# Patient Record
Sex: Male | Born: 1985 | Race: Black or African American | Hispanic: No | Marital: Married | Smoking: Former smoker
Health system: Southern US, Community
[De-identification: ages and names within clinical notes are randomized; demographics above are authoritative.]

## PROBLEM LIST (undated history)

## (undated) DIAGNOSIS — J869 Pyothorax without fistula: Secondary | ICD-10-CM

## (undated) DIAGNOSIS — I10 Essential (primary) hypertension: Secondary | ICD-10-CM

## (undated) HISTORY — PX: FRACTURE SURGERY: SHX138

---

## 2005-06-08 ENCOUNTER — Emergency Department (HOSPITAL_COMMUNITY): Admission: EM | Admit: 2005-06-08 | Discharge: 2005-06-08 | Payer: Self-pay | Admitting: Emergency Medicine

## 2006-07-24 ENCOUNTER — Emergency Department (HOSPITAL_COMMUNITY): Admission: EM | Admit: 2006-07-24 | Discharge: 2006-07-25 | Payer: Self-pay | Admitting: Emergency Medicine

## 2006-07-25 ENCOUNTER — Emergency Department (HOSPITAL_COMMUNITY): Admission: EM | Admit: 2006-07-25 | Discharge: 2006-07-25 | Payer: Self-pay | Admitting: Emergency Medicine

## 2006-07-25 ENCOUNTER — Ambulatory Visit (HOSPITAL_COMMUNITY): Admission: RE | Admit: 2006-07-25 | Discharge: 2006-07-25 | Payer: Self-pay | Admitting: Family Medicine

## 2006-12-24 ENCOUNTER — Emergency Department (HOSPITAL_COMMUNITY): Admission: EM | Admit: 2006-12-24 | Discharge: 2006-12-25 | Payer: Self-pay | Admitting: Emergency Medicine

## 2006-12-26 ENCOUNTER — Emergency Department (HOSPITAL_COMMUNITY): Admission: EM | Admit: 2006-12-26 | Discharge: 2006-12-27 | Payer: Self-pay | Admitting: Emergency Medicine

## 2007-05-03 ENCOUNTER — Emergency Department (HOSPITAL_COMMUNITY): Admission: EM | Admit: 2007-05-03 | Discharge: 2007-05-03 | Payer: Self-pay | Admitting: Family Medicine

## 2008-07-02 ENCOUNTER — Emergency Department (HOSPITAL_COMMUNITY): Admission: EM | Admit: 2008-07-02 | Discharge: 2008-07-02 | Payer: Self-pay | Admitting: Emergency Medicine

## 2008-09-11 ENCOUNTER — Emergency Department (HOSPITAL_COMMUNITY): Admission: EM | Admit: 2008-09-11 | Discharge: 2008-09-11 | Payer: Self-pay | Admitting: Emergency Medicine

## 2009-01-27 ENCOUNTER — Emergency Department (HOSPITAL_COMMUNITY): Admission: EM | Admit: 2009-01-27 | Discharge: 2009-01-27 | Payer: Self-pay | Admitting: Emergency Medicine

## 2009-04-07 ENCOUNTER — Emergency Department (HOSPITAL_COMMUNITY): Admission: EM | Admit: 2009-04-07 | Discharge: 2009-04-07 | Payer: Self-pay | Admitting: Family Medicine

## 2009-05-09 ENCOUNTER — Emergency Department (HOSPITAL_COMMUNITY): Admission: EM | Admit: 2009-05-09 | Discharge: 2009-05-09 | Payer: Self-pay | Admitting: Emergency Medicine

## 2010-05-23 LAB — POCT I-STAT, CHEM 8
BUN: 7 mg/dL (ref 6–23)
Calcium, Ion: 1.15 mmol/L (ref 1.12–1.32)
Chloride: 104 mEq/L (ref 96–112)
Hemoglobin: 16.3 g/dL (ref 13.0–17.0)
Sodium: 141 mEq/L (ref 135–145)
TCO2: 30 mmol/L (ref 0–100)

## 2010-05-23 LAB — POCT RAPID STREP A (OFFICE): Streptococcus, Group A Screen (Direct): NEGATIVE

## 2010-11-22 LAB — POCT RAPID STREP A: Streptococcus, Group A Screen (Direct): NEGATIVE

## 2010-11-22 LAB — POCT INFECTIOUS MONO SCREEN: Mono Screen: POSITIVE — AB

## 2010-12-08 LAB — I-STAT 8, (EC8 V) (CONVERTED LAB)
BUN: 10
Chloride: 104
Glucose, Bld: 93
Operator id: 294511
Potassium: 3.5
Sodium: 140

## 2010-12-08 LAB — POCT I-STAT CREATININE: Creatinine, Ser: 1.2

## 2010-12-08 LAB — CBC
HCT: 41.9
Hemoglobin: 14.3
Platelets: 338
RBC: 4.62
RDW: 12.1
WBC: 13.1 — ABNORMAL HIGH

## 2011-12-02 ENCOUNTER — Emergency Department (HOSPITAL_COMMUNITY): Payer: Self-pay

## 2011-12-02 ENCOUNTER — Emergency Department (HOSPITAL_COMMUNITY)
Admission: EM | Admit: 2011-12-02 | Discharge: 2011-12-02 | Disposition: A | Discharge status: Home / Self Care | Payer: Self-pay | Attending: Emergency Medicine | Admitting: Emergency Medicine

## 2011-12-02 ENCOUNTER — Encounter (HOSPITAL_COMMUNITY): Payer: Self-pay

## 2011-12-02 DIAGNOSIS — I10 Essential (primary) hypertension: Secondary | ICD-10-CM | POA: Insufficient documentation

## 2011-12-02 DIAGNOSIS — R0602 Shortness of breath: Secondary | ICD-10-CM | POA: Insufficient documentation

## 2011-12-02 DIAGNOSIS — R42 Dizziness and giddiness: Secondary | ICD-10-CM | POA: Insufficient documentation

## 2011-12-02 HISTORY — DX: Essential (primary) hypertension: I10

## 2011-12-02 LAB — CBC WITH DIFFERENTIAL/PLATELET
Basophils Absolute: 0.1 10*3/uL (ref 0.0–0.1)
Eosinophils Relative: 1 % (ref 0–5)
HCT: 43.7 % (ref 39.0–52.0)
Hemoglobin: 15.4 g/dL (ref 13.0–17.0)
Lymphocytes Relative: 21 % (ref 12–46)
MCHC: 35.2 g/dL (ref 30.0–36.0)
MCV: 86.9 fL (ref 78.0–100.0)
Neutro Abs: 9.8 10*3/uL — ABNORMAL HIGH (ref 1.7–7.7)
Neutrophils Relative %: 72 % (ref 43–77)
Platelets: 415 10*3/uL — ABNORMAL HIGH (ref 150–400)

## 2011-12-02 LAB — COMPREHENSIVE METABOLIC PANEL
AST: 17 U/L (ref 0–37)
Albumin: 4.5 g/dL (ref 3.5–5.2)
CO2: 28 mEq/L (ref 19–32)
Calcium: 9.6 mg/dL (ref 8.4–10.5)
Chloride: 100 mEq/L (ref 96–112)
Creatinine, Ser: 1 mg/dL (ref 0.50–1.35)
GFR calc Af Amer: >90 mL/min (ref >90)
GFR calc non Af Amer: >90 mL/min (ref >90)
Potassium: 3.8 mEq/L (ref 3.5–5.1)
Sodium: 137 mEq/L (ref 135–145)
Total Bilirubin: 0.6 mg/dL (ref 0.3–1.2)

## 2011-12-02 LAB — URINALYSIS, ROUTINE W REFLEX MICROSCOPIC
Glucose, UA: NEGATIVE mg/dL
Hgb urine dipstick: NEGATIVE
Ketones, ur: NEGATIVE mg/dL
Leukocytes, UA: NEGATIVE
Nitrite: NEGATIVE
Specific Gravity, Urine: 1.02 (ref 1.005–1.030)

## 2011-12-02 MED ORDER — BENAZEPRIL HCL 10 MG PO TABS
10.0000 mg | ORAL_TABLET | Freq: Every day | ORAL | Status: DC
  Administered 2011-12-02: 10 mg via ORAL
  Filled 2011-12-02: qty 1

## 2011-12-02 MED ORDER — AMLODIPINE BESY-BENAZEPRIL HCL 5-10 MG PO CAPS: 1.0000 | ORAL_CAPSULE | Freq: Every day | ORAL | Status: DC

## 2011-12-02 MED ORDER — AMLODIPINE BESYLATE 5 MG PO TABS
5.0000 mg | ORAL_TABLET | Freq: Once | ORAL | Status: AC
  Administered 2011-12-02: 5 mg via ORAL
  Filled 2011-12-02: qty 1

## 2011-12-02 NOTE — ED Provider Notes (Addendum)
History     CSN: [id number]  Arrival date & time 12/02/11  1552   First MD Initiated Contact with Patient 12/02/11 1849      Chief Complaint  Patient presents with  . Hypertension  . Headache  . Dizziness    (Consider location/radiation/quality/duration/timing/severity/associated sxs/prior treatment) The history is provided by the patient.  Todd Pena is a 26 y.o. male here with dizziness and headache and SOB. He has been having intermittent dizziness and lightheadedness for the last week. Felt like he was going to pass out but didn't pass out. Also occasional frontal headaches. No vomiting but felt nauseous. He also has intermittent shortness of breath but no chest pain. No recent travel. He is here because his BP today was 190/120. No fever or neck pain or urinary symptoms.    Past Medical History  Diagnosis Date  . Hypertension     History reviewed. No pertinent past surgical history.  No family history on file.  History  Substance Use Topics  . Smoking status: Never Smoker   . Smokeless tobacco: Not on file  . Alcohol Use: No      Review of Systems  Respiratory: Positive for shortness of breath.   Neurological: Positive for dizziness, weakness and headaches.  All other systems reviewed and are negative.    Allergies  Review of patient's allergies indicates no known allergies.  Home Medications   Current Outpatient Rx  Name Route Sig Dispense Refill  . NAPROXEN SODIUM 220 MG PO TABS Oral Take 220 mg by mouth 2 (two) times daily with a meal.      BP 163/117  Pulse 78  Temp 98.9 F (37.2 C) (Oral)  Resp 26  Wt 260 lb (117.935 kg)  SpO2 98%  Physical Exam  Nursing note and vitals reviewed. Constitutional: He is oriented to person, place, and time. He appears well-developed and well-nourished.       anxious  HENT:  Head: Normocephalic.  Mouth/Throat: Oropharynx is clear and moist.  Eyes: Conjunctivae normal are normal. Pupils are equal,  round, and reactive to light.  Neck: Normal range of motion. Neck supple.  Cardiovascular: Normal rate, regular rhythm and normal heart sounds.   Pulmonary/Chest: Effort normal and breath sounds normal. No respiratory distress. He has no wheezes. He has no rales.  Abdominal: Soft. Bowel sounds are normal. He exhibits no distension. There is no tenderness. There is no rebound.  Musculoskeletal: Normal range of motion. He exhibits no edema and no tenderness.  Neurological: He is alert and oriented to person, place, and time. No cranial nerve deficit.       Good strength and sensation throughout.   Skin: Skin is warm and dry.  Psychiatric: He has a normal mood and affect. His behavior is normal. Judgment and thought content normal.    ED Course  Procedures (including critical care time)  Labs Reviewed  CBC WITH DIFFERENTIAL - Abnormal; Notable for the following:    WBC 13.7 (*)     Platelets 415 (*)     Neutro Abs 9.8 (*)     All other components within normal limits  GLUCOSE, CAPILLARY  COMPREHENSIVE METABOLIC PANEL  TROPONIN I  URINALYSIS, ROUTINE W REFLEX MICROSCOPIC   Dg Chest 2 View  12/02/2011  *RADIOLOGY REPORT*  Clinical Data: Shortness of breath.  CHEST - 2 VIEW  Comparison: None.  Findings: Low lung volumes are present, causing crowding of the pulmonary vasculature.  Cardiac and mediastinal contours appear unremarkable.  No  pleural effusion observed.  The lungs appear clear.  IMPRESSION:  1. Low lung volumes are present, causing crowding of the pulmonary vasculature.    Otherwise, no significant abnormality identified.   Original Report Authenticated By: Dellia Cloud, M.D.    Ct Head Wo Contrast  12/02/2011  *RADIOLOGY REPORT*  Clinical Data: High blood pressure.  Dizzy.  Headache.  CT HEAD WITHOUT CONTRAST  Technique:  Contiguous axial images were obtained from the base of the skull through the vertex without contrast.  Comparison: None.  Findings: There is no intra or  extra-axial fluid collection or mass lesion.  The basilar cisterns and ventricles have a normal appearance.  There is no CT evidence for acute infarction or hemorrhage.  Bone windows show no acute findings.  IMPRESSION: Negative exam.   Original Report Authenticated By: Patterson Hammersmith, M.D.      1. Hypertension   2. Dizziness   3. Shortness of breath      Date: 12/02/2011  Rate: 71  Rhythm: normal sinus rhythm  QRS Axis: normal  Intervals: normal  ST/T Wave abnormalities: TWI inferior laterally   Conduction Disutrbances:none  Narrative Interpretation: inferior Q waves  Old EKG Reviewed: none available    MDM  Todd Pena is a 26 y.o. male here with dizziness, HA, SOB and hypertension. I think his symptoms are likely from hypertension. His neuro exam is nl. Will get labs, trop x 1 (low risk for ACS but SOB for more than a day), and CT head (r/o hypertensive bleed). Will start PO antihypertensives and re evaluate.   9:45 PM Patient feels well. Not dizzy or short of breath. Low risk for ACS and symptoms persists for more than a day so trop x 1 sufficient. Labs showed WBC 13, with nl CXR and nl UA. CMP nl and CT head nl. Will d/c home with norvasc and benzapril. BP slightly improved now 150s/ 110.        Richardean Canal, MD 12/02/11 2147  Richardean Canal, MD 12/02/11 [phone]

## 2011-12-02 NOTE — ED Notes (Signed)
Pt came to ED today because he had a High BP check at home of 190/125. BP in room is 176/113. Pt reports feeling dizzy, headache, and SOB. Sat 99% RA. Positive nausea, no vomiting.

## 2011-12-02 NOTE — ED Notes (Signed)
Patient transported to CT 

## 2011-12-02 NOTE — ED Notes (Signed)
Patient instructed to get medication for blood pressure filled and to start taking medication.

## 2011-12-02 NOTE — ED Notes (Signed)
Pt is aware of the need for urine sample however states he is unable at this time.

## 2011-12-02 NOTE — ED Notes (Signed)
Pt denies chest pain at this time pt states intermittent shortness of breath today only.

## 2013-08-29 ENCOUNTER — Encounter (HOSPITAL_BASED_OUTPATIENT_CLINIC_OR_DEPARTMENT_OTHER): Payer: Self-pay | Admitting: Emergency Medicine

## 2013-08-29 ENCOUNTER — Emergency Department (HOSPITAL_BASED_OUTPATIENT_CLINIC_OR_DEPARTMENT_OTHER): Payer: BC Managed Care – PPO

## 2013-08-29 ENCOUNTER — Emergency Department (HOSPITAL_BASED_OUTPATIENT_CLINIC_OR_DEPARTMENT_OTHER)
Admission: EM | Admit: 2013-08-29 | Discharge: 2013-08-29 | Disposition: A | Discharge status: Home / Self Care | Payer: BC Managed Care – PPO | Attending: Emergency Medicine | Admitting: Emergency Medicine

## 2013-08-29 DIAGNOSIS — M25469 Effusion, unspecified knee: Secondary | ICD-10-CM | POA: Insufficient documentation

## 2013-08-29 DIAGNOSIS — I10 Essential (primary) hypertension: Secondary | ICD-10-CM | POA: Insufficient documentation

## 2013-08-29 DIAGNOSIS — Z791 Long term (current) use of non-steroidal anti-inflammatories (NSAID): Secondary | ICD-10-CM | POA: Insufficient documentation

## 2013-08-29 DIAGNOSIS — M25461 Effusion, right knee: Secondary | ICD-10-CM

## 2013-08-29 MED ORDER — HYDROCODONE-ACETAMINOPHEN 5-325 MG PO TABS: 2.0000 | ORAL_TABLET | ORAL | Status: DC | PRN

## 2013-08-29 MED ORDER — IBUPROFEN 800 MG PO TABS: 800.0000 mg | ORAL_TABLET | Freq: Three times a day (TID) | ORAL | Status: DC

## 2013-08-29 NOTE — ED Notes (Signed)
Pt reports (R) knee pain x 1 week.  Denies ijury

## 2013-08-29 NOTE — ED Provider Notes (Signed)
CSN: [id number]     Arrival date & time 08/29/13  1055 History  This chart was scribed for Glynn OctaveStephen Kyrie Fludd, MD by Shari HeritageAisha Amuda, ED Scribe. The patient was seen in room MH08/MH08. Patient's care was started at 11:44 AM.   Chief Complaint  Patient presents with  . Knee Pain    The history is provided by the patient. No language interpreter was used.    HPI Comments: Todd Pena is a 28 y.o. male with history of HTN who presents to the Emergency Department complaining of progressively worsening, right knee pain onset 1 week ago. He says when pain first started it was intermittent, but for the past couple of days pain has been constant. Pain is worse with extension and flexion of his knee. He says that when he woke up this morning he noticed "lumps" to his knee. He has tried wrapping the knee without relief. He denies any obvious injury or trauma. He does not exercise regularly. He denies any other pain, fever, vomiting, chest pain, shortness of breath, abdominal pain or urinary symptoms.   Past Medical History  Diagnosis Date  . Hypertension    History reviewed. No pertinent past surgical history. History reviewed. No pertinent family history. History  Substance Use Topics  . Smoking status: Never Smoker   . Smokeless tobacco: Not on file  . Alcohol Use: No    Review of Systems A complete 10 system review of systems was obtained and all systems are negative except as noted in the HPI and PMH.   Allergies  Review of patient's allergies indicates no known allergies.  Home Medications   Prior to Admission medications   Medication Sig Start Date End Date Taking? Authorizing Provider  HYDROcodone-acetaminophen (NORCO/VICODIN) 5-325 MG per tablet Take 2 tablets by mouth every 4 (four) hours as needed. 08/29/13   Glynn OctaveStephen Marian Grandt, MD  ibuprofen (ADVIL,MOTRIN) 800 MG tablet Take 1 tablet (800 mg total) by mouth 3 (three) times daily. 08/29/13   Glynn OctaveStephen Gerlean Cid, MD   Triage Vitals: BP  189/107  Pulse 80  Temp(Src) 98.7 F (37.1 C) (Oral)  Resp 16  Wt 265 lb (120.203 kg)  SpO2 97%  Physical Exam  Nursing note and vitals reviewed. Constitutional: He is oriented to person, place, and time. He appears well-developed and well-nourished. No distress.  HENT:  Head: Normocephalic and atraumatic.  Mouth/Throat: Oropharynx is clear and moist. No oropharyngeal exudate.  Eyes: Conjunctivae and EOM are normal. Pupils are equal, round, and reactive to light.  Neck: Normal range of motion. Neck supple.  No meningismus.  Cardiovascular: Normal rate, regular rhythm, normal heart sounds and intact distal pulses.   No murmur heard. Pulmonary/Chest: Effort normal and breath sounds normal. No respiratory distress.  Abdominal: Soft. There is no tenderness. There is no rebound and no guarding.  Musculoskeletal: Normal range of motion. He exhibits no edema and no tenderness.       Right knee: He exhibits effusion. He exhibits no MCL laxity.  Small effusion of right knee. No ligamental laxity. Flexion and extension of right knee intact.  Neurological: He is alert and oriented to person, place, and time. No cranial nerve deficit. He exhibits normal muscle tone. Coordination normal.  No ataxia on finger to nose bilaterally. No pronator drift. 5/5 strength throughout. CN 2-12 intact. Negative Romberg. Equal grip strength. Sensation intact. Gait is normal.   Skin: Skin is warm.  Psychiatric: He has a normal mood and affect. His behavior is normal.  ED Course  Procedures (including critical care time) DIAGNOSTIC STUDIES: Oxygen Saturation is 97% on room air, normal by my interpretation.    COORDINATION OF CARE: 11:47 AM- Patient informed of current plan for treatment and evaluation and agrees with plan at this time.   12:11 PM - Updated patient on imaging results which shows large joint effusion. Advised patient that we will provide knee immobilizer, and treat with antiinflammatory  medications and elevation. Will also provide referral to sports medicine.  Imaging Review Dg Knee Complete 4 Views Right  08/29/2013   CLINICAL DATA:  Knee pain  EXAM: RIGHT KNEE - COMPLETE 4+ VIEW  COMPARISON:  None.  FINDINGS: Large joint effusion. Negative for fracture. Joint spaces are normal without significant degenerative change.  IMPRESSION: Large joint effusion may indicate internal derangement. Negative for fracture.   Electronically Signed   By: Marlan Palauharles  Clark M.D.   On: 08/29/2013 12:06     EKG Interpretation None      MDM   Final diagnoses:  Knee effusion, right  Essential hypertension   1 week of R knee pain, denies trauma.  Worsening pain today.  No fever or vomiting.    R knee with effusion, ROM intact, flexion and extension intact. No warmth or erythema.  Xray negative.  Effusion noted.  ROM intact.  No evidence of septic joint. Knee immoblizer, RICE, follow up with sports med.  Patient aware of elevated blood pressure and states that his medications for several months and does not know what they are. Denies any chest pain, headache, shortness of breath, vision change. He understands the risks of high blood pressure And long-term effects on brain, kidney, eyes. He needs to reestablish care with PCP.  I personally performed the services described in this documentation, which was scribed in my presence. The recorded information has been reviewed and is accurate.   Glynn OctaveStephen Travas Schexnayder, MD 08/29/13 [phone]

## 2013-08-29 NOTE — Discharge Instructions (Signed)
Knee Effusion Follow up with Dr. Pearletha ForgeHudnall this week.  Keep your knee elevated. Return to the ED if you develop new or worsening symptoms. The medical term for having fluid in your knee is effusion. This is often due to an internal derangement of the knee. This means something is wrong inside the knee. Some of the causes of fluid in the knee may be torn cartilage, a torn ligament, or bleeding into the joint from an injury. Your knee is likely more difficult to bend and move. This is often because there is increased pain and pressure in the joint. The time it takes for recovery from a knee effusion depends on different factors, including:   Type of injury.  Your age.  Physical and medical conditions.  Rehabilitation Strategies. How long you will be away from your normal activities will depend on what kind of knee problem you have and how much damage is present. Your knee has two types of cartilage. Articular cartilage covers the bone ends and lets your knee bend and move smoothly. Two menisci, thick pads of cartilage that form a rim inside the joint, help absorb shock and stabilize your knee. Ligaments bind the bones together and support your knee joint. Muscles move the joint, help support your knee, and take stress off the joint itself. CAUSES  Often an effusion in the knee is caused by an injury to one of the menisci. This is often a tear in the cartilage. Recovery after a meniscus injury depends on how much meniscus is damaged and whether you have damaged other knee tissue. Small tears may heal on their own with conservative treatment. Conservative means rest, limited weight bearing activity and muscle strengthening exercises. Your recovery may take up to 6 weeks.  TREATMENT  Larger tears may require surgery. Meniscus injuries may be treated during arthroscopy. Arthroscopy is a procedure in which your surgeon uses a small telescope like instrument to look in your knee. Your caregiver can make a more  accurate diagnosis (learning what is wrong) by performing an arthroscopic procedure. If your injury is on the inner margin of the meniscus, your surgeon may trim the meniscus back to a smooth rim. In other cases your surgeon will try to repair a damaged meniscus with stitches (sutures). This may make rehabilitation take longer, but may provide better long term result by helping your knee keep its shock absorption capabilities. Ligaments which are completely torn usually require surgery for repair. HOME CARE INSTRUCTIONS  Use crutches as instructed.  If a brace is applied, use as directed.  Once you are home, an ice pack applied to your swollen knee may help with discomfort and help decrease swelling.  Keep your knee raised (elevated) when you are not up and around or on crutches.  Only take over-the-counter or prescription medicines for pain, discomfort, or fever as directed by your caregiver.  Your caregivers will help with instructions for rehabilitation of your knee. This often includes strengthening exercises.  You may resume a normal diet and activities as directed. SEEK MEDICAL CARE IF:   There is increased swelling in your knee.  You notice redness, swelling, or increasing pain in your knee.  An unexplained oral temperature above 102 F (38.9 C) develops. SEEK IMMEDIATE MEDICAL CARE IF:   You develop a rash.  You have difficulty breathing.  You have any allergic reactions from medications you may have been given.  There is severe pain with any motion of the knee. MAKE SURE YOU:  Understand these instructions.  Will watch your condition.  Will get help right away if you are not doing well or get worse. Document Released: 05/07/2003 Document Revised: 05/09/2011 Document Reviewed: 07/11/2007 [facility] Patient Information 2015 [employer], [place]. This information is not intended to replace advice given to you by your health care provider. Make sure you discuss any questions  you have with your health care provider.

## 2013-10-24 ENCOUNTER — Emergency Department (HOSPITAL_BASED_OUTPATIENT_CLINIC_OR_DEPARTMENT_OTHER): Payer: BC Managed Care – PPO

## 2013-10-24 ENCOUNTER — Encounter (HOSPITAL_BASED_OUTPATIENT_CLINIC_OR_DEPARTMENT_OTHER): Payer: Self-pay | Admitting: Emergency Medicine

## 2013-10-24 ENCOUNTER — Emergency Department (HOSPITAL_BASED_OUTPATIENT_CLINIC_OR_DEPARTMENT_OTHER)
Admission: EM | Admit: 2013-10-24 | Discharge: 2013-10-24 | Disposition: A | Discharge status: Home / Self Care | Payer: BC Managed Care – PPO | Attending: Emergency Medicine | Admitting: Emergency Medicine

## 2013-10-24 DIAGNOSIS — Z791 Long term (current) use of non-steroidal anti-inflammatories (NSAID): Secondary | ICD-10-CM | POA: Insufficient documentation

## 2013-10-24 DIAGNOSIS — R51 Headache: Secondary | ICD-10-CM | POA: Insufficient documentation

## 2013-10-24 DIAGNOSIS — H53149 Visual discomfort, unspecified: Secondary | ICD-10-CM | POA: Insufficient documentation

## 2013-10-24 DIAGNOSIS — R519 Headache, unspecified: Secondary | ICD-10-CM

## 2013-10-24 DIAGNOSIS — I10 Essential (primary) hypertension: Secondary | ICD-10-CM | POA: Insufficient documentation

## 2013-10-24 LAB — CBC
HCT: 44.1 % (ref 39.0–52.0)
Hemoglobin: 15.4 g/dL (ref 13.0–17.0)
MCH: 30.7 pg (ref 26.0–34.0)
MCHC: 34.9 g/dL (ref 30.0–36.0)
MCV: 87.8 fL (ref 78.0–100.0)
Platelets: 444 10*3/uL — ABNORMAL HIGH (ref 150–400)
RBC: 5.02 MIL/uL (ref 4.22–5.81)
RDW: 12.1 % (ref 11.5–15.5)
WBC: 11.8 10*3/uL — AB (ref 4.0–10.5)

## 2013-10-24 LAB — BASIC METABOLIC PANEL
Anion gap: 12 (ref 5–15)
BUN: 14 mg/dL (ref 6–23)
CHLORIDE: 100 meq/L (ref 96–112)
CO2: 28 mEq/L (ref 19–32)
Calcium: 9.9 mg/dL (ref 8.4–10.5)
Creatinine, Ser: 1.1 mg/dL (ref 0.50–1.35)
Glucose, Bld: 116 mg/dL — ABNORMAL HIGH (ref 70–99)
POTASSIUM: 3.7 meq/L (ref 3.7–5.3)
Sodium: 140 mEq/L (ref 137–147)

## 2013-10-24 LAB — TROPONIN I

## 2013-10-24 MED ORDER — DIPHENHYDRAMINE HCL 50 MG/ML IJ SOLN
25.0000 mg | Freq: Once | INTRAMUSCULAR | Status: AC
  Administered 2013-10-24: 25 mg via INTRAVENOUS
  Filled 2013-10-24: qty 1

## 2013-10-24 MED ORDER — HYDRALAZINE HCL 20 MG/ML IJ SOLN
10.0000 mg | Freq: Once | INTRAMUSCULAR | Status: AC
  Administered 2013-10-24: 10 mg via INTRAVENOUS
  Filled 2013-10-24: qty 1

## 2013-10-24 MED ORDER — METOCLOPRAMIDE HCL 5 MG/ML IJ SOLN
5.0000 mg | Freq: Once | INTRAMUSCULAR | Status: AC
  Administered 2013-10-24: 5 mg via INTRAVENOUS
  Filled 2013-10-24: qty 2

## 2013-10-24 MED ORDER — SODIUM CHLORIDE 0.9 % IV BOLUS (SEPSIS)
1000.0000 mL | INTRAVENOUS | Status: AC
Start: 2013-10-24 — End: 2013-10-24
  Administered 2013-10-24: 1000 mL via INTRAVENOUS

## 2013-10-24 MED ORDER — AMLODIPINE BESY-BENAZEPRIL HCL 5-10 MG PO CAPS: 1.0000 | ORAL_CAPSULE | Freq: Every day | ORAL | Status: DC

## 2013-10-24 NOTE — ED Provider Notes (Signed)
CSN: [id number]     Arrival date & time 10/24/13  1439 History   First MD Initiated Contact with Patient 10/24/13 1451     Chief Complaint  Patient presents with  . Hypertension     (Consider location/radiation/quality/duration/timing/severity/associated sxs/prior Treatment) Patient is a 28 y.o. male presenting with hypertension and headaches. The history is provided by the patient.  Hypertension This is a chronic problem. Episode onset: several years ago. The problem occurs constantly. The problem has not changed since onset.Associated symptoms include headaches. Pertinent negatives include no chest pain, no abdominal pain and no shortness of breath. Nothing aggravates the symptoms. Nothing relieves the symptoms. Treatments tried: previously on meds. The treatment provided moderate relief.  Headache Pain location:  L temporal Quality:  Sharp Severity currently:  4/10 Severity at highest:  6/10 Onset quality:  Gradual Duration:  4 days Timing:  Intermittent Progression:  Improving Chronicity:  New Similar to prior headaches: yes   Context comment:  Spontaneously Relieved by:  NSAIDs Worsened by:  Light Ineffective treatments:  None tried Associated symptoms: photophobia   Associated symptoms: no abdominal pain, no cough, no diarrhea, no pain, no fever, no nausea, no neck pain, no numbness and no vomiting     Past Medical History  Diagnosis Date  . Hypertension    History reviewed. No pertinent past surgical history. No family history on file. History  Substance Use Topics  . Smoking status: Never Smoker   . Smokeless tobacco: Not on file  . Alcohol Use: No    Review of Systems  Constitutional: Negative for fever.  HENT: Negative for drooling and rhinorrhea.   Eyes: Positive for photophobia. Negative for pain.  Respiratory: Negative for cough and shortness of breath.   Cardiovascular: Negative for chest pain and leg swelling.  Gastrointestinal: Negative for nausea,  vomiting, abdominal pain and diarrhea.  Genitourinary: Negative for dysuria and hematuria.  Musculoskeletal: Negative for gait problem and neck pain.  Skin: Negative for color change.  Neurological: Positive for headaches. Negative for numbness.  Hematological: Negative for adenopathy.  Psychiatric/Behavioral: Negative for behavioral problems.  All other systems reviewed and are negative.     Allergies  Review of patient's allergies indicates no known allergies.  Home Medications   Prior to Admission medications   Medication Sig Start Date End Date Taking? Authorizing Provider  HYDROcodone-acetaminophen (NORCO/VICODIN) 5-325 MG per tablet Take 2 tablets by mouth every 4 (four) hours as needed. 08/29/13   Glynn Octave, MD  ibuprofen (ADVIL,MOTRIN) 800 MG tablet Take 1 tablet (800 mg total) by mouth 3 (three) times daily. 08/29/13   Glynn Octave, MD   BP 189/118  Pulse 77  Temp(Src) 97.9 F (36.6 C) (Oral)  Resp 20  Ht  (1.803 m)  Wt 265 lb (120.203 kg)  BMI 36.98 kg/m2  SpO2 97% Physical Exam  Nursing note and vitals reviewed. Constitutional: He is oriented to person, place, and time. He appears well-developed and well-nourished.  HENT:  Head: Normocephalic and atraumatic.  Right Ear: External ear normal.  Left Ear: External ear normal.  Nose: Nose normal.  Mouth/Throat: Oropharynx is clear and moist. No oropharyngeal exudate.  Eyes: Conjunctivae and EOM are normal. Pupils are equal, round, and reactive to light.  Neck: Normal range of motion. Neck supple.  Cardiovascular: Normal rate, regular rhythm, normal heart sounds and intact distal pulses.  Exam reveals no gallop and no friction rub.   No murmur heard. Pulmonary/Chest: Effort normal and breath sounds normal. No respiratory distress.  He has no wheezes.  Abdominal: Soft. Bowel sounds are normal. He exhibits no distension. There is no tenderness. There is no rebound and no guarding.  Musculoskeletal: Normal  range of motion. He exhibits no edema and no tenderness.  Neurological: He is alert and oriented to person, place, and time.  alert, oriented x3 speech: normal in context and clarity memory: intact grossly cranial nerves II-XII: intact motor strength: full proximally and distally no involuntary movements or tremors sensation: intact to light touch diffusely  cerebellar: finger-to-nose and heel-to-shin intact gait: normal forwards and backwards   Skin: Skin is warm and dry.  Psychiatric: He has a normal mood and affect. His behavior is normal.    ED Course  Procedures (including critical care time) Labs Review Labs Reviewed  CBC - Abnormal; Notable for the following:    WBC 11.8 (*)    Platelets 444 (*)    All other components within normal limits  BASIC METABOLIC PANEL - Abnormal; Notable for the following:    Glucose, Bld 116 (*)    All other components within normal limits  TROPONIN I    Imaging Review Ct Head Wo Contrast  10/24/2013   CLINICAL DATA:  Hypertension with headache  EXAM: CT HEAD WITHOUT CONTRAST  TECHNIQUE: Contiguous axial images were obtained from the base of the skull through the vertex without intravenous contrast.  COMPARISON:  None.  FINDINGS: The ventricles are normal in size and configuration. There is no mass, hemorrhage, extra-axial fluid collection, or midline shift. Gray-white compartments appear normal. No acute infarct apparent. Bony calvarium appears intact. Visualized mastoid air cells are clear. There is a small retention cyst in each inferior maxillary antrum.  IMPRESSION: Small retention cyst in each maxillary antrum. No intracranial mass, hemorrhage, or focal gray -white compartment lesion.   Electronically Signed   By: Bretta Bang M.D.   On: 10/24/2013 16:06     EKG Interpretation   Date/Time:  Thursday October 24 2013 15:29:01 EDT Ventricular Rate:  84 PR Interval:  134 QRS Duration: 88 QT Interval:  360 QTC Calculation: 425 R  Axis:   35 Text Interpretation:  Normal sinus rhythm Nonspecific T wave abnormality  No significant change since last tracing Confirmed by Harlei Lehrmann  MD,  Trinette Vera ([id number]) on 10/24/2013 3:40:05 PM      MDM   Final diagnoses:  Headache, unspecified headache type  Essential hypertension    2:58 PM 28 y.o. male w hx of HTN who pw gradual onset intermittent HA x 4 days. C/w previous HA's. Was seen here in 2013 and started on BP meds in the ED. He has not been on BP meds since that time. BP is 189/118, otherwise VS unremarkable. Normal neuro exam. No cp/sob. Will get screening imaging, labs, hydralazine for BP, and HA cocktail.   5:13 PM: I interpreted/reviewed the labs and/or imaging which were non-contributory.  BP dec appropriately. HA now gone. Will restart lotrel which he was on before.  I have discussed the diagnosis/risks/treatment options with the patient and believe the pt to be eligible for discharge home to follow-up with and estab w/ a pcp. We also discussed returning to the ED immediately if new or worsening sx occur. We discussed the sx which are most concerning (e.g., return of HA, fever, sob, cp) that necessitate immediate return. Medications administered to the patient during their visit and any new prescriptions provided to the patient are listed below.  Medications given during this visit Medications  sodium chloride 0.9 %  bolus 1,000 mL (0 mLs Intravenous Stopped 10/24/13 1621)  hydrALAZINE (APRESOLINE) injection 10 mg (10 mg Intravenous Given 10/24/13 1544)  metoCLOPramide (REGLAN) injection 5 mg (5 mg Intravenous Given 10/24/13 1543)  diphenhydrAMINE (BENADRYL) injection 25 mg (25 mg Intravenous Given 10/24/13 1544)    New Prescriptions   AMLODIPINE-BENAZEPRIL (LOTREL) 5-10 MG PER CAPSULE    Take 1 capsule by mouth daily.     Purvis Sheffield, MD 10/24/13 1714

## 2013-10-24 NOTE — Discharge Instructions (Signed)
°Emergency Department Resource Guide °1) Find a Doctor and Pay Out of Pocket °Although you won't have to find out who is covered by your insurance plan, it is a good idea to ask around and get recommendations. You will then need to call the office and see if the doctor you have chosen will accept you as a new patient and what types of options they offer for patients who are self-pay. Some doctors offer discounts or will set up payment plans for their patients who do not have insurance, but you will need to ask so you aren't surprised when you get to your appointment. ° °2) Contact Your Local Health Department °Not all health departments have doctors that can see patients for sick visits, but many do, so it is worth a call to see if yours does. If you don't know where your local health department is, you can check in your phone book. The CDC also has a tool to help you locate your state's health department, and many state websites also have listings of all of their local health departments. ° °3) Find a Walk-in Clinic °If your illness is not likely to be very severe or complicated, you may want to try a walk in clinic. These are popping up all over the country in pharmacies, drugstores, and shopping centers. They're usually staffed by nurse practitioners or physician assistants that have been trained to treat common illnesses and complaints. They're usually fairly quick and inexpensive. However, if you have serious medical issues or chronic medical problems, these are probably not your best option. ° °No Primary Care Doctor: °- Call Health Connect at  832-8000 - they can help you locate a primary care doctor that  accepts your insurance, provides certain services, etc. °- Physician Referral Service- 1-800-533-3463 ° °Chronic Pain Problems: °Organization         Address  Phone   Notes  °[facility]  (336) [phone] Patients need to be referred by their primary care doctor.  ° °Medication  Assistance: °Organization         Address  Phone   Notes  °[place] [facility] [address] °[place], [place] [address] (336) 641-8030 --Must be a resident of [place] °-- Must have NO insurance coverage whatsoever (no Medicaid/ Medicare, etc.) °-- The pt. MUST have a primary care doctor that directs their care regularly and follows them in the community °  °[facility]  (866) 331-1348   °[employer] Way  (888) 892-1162   ° °Agencies that provide inexpensive medical care: °Organization         Address  Phone   Notes  °[facility]  (336) 832-8035   °[facility]    (336) 832-7272   °[facility] [address] °[place], [place] [address] (336) 832-4777   °[facility] [address], °[place] (336) 271-4999   °[facility]    (336) 373-0678   °[facility]    (336) 272-1050   °[facility] ° [address], [place] Phone:  (336) 832-4444, Fax:  (336) 832-4440 Hours of Operation:  9 am - 6 pm, M-F.  Also accepts Medicaid/Medicare and self-pay.  °[facility] ° [address],  Phone: (336) [phone], Fax: (336) [phone]. Hours of Operation:  8:30 am - 5:30 pm, M-F.  Also accepts Medicaid and self-pay.  °[facility] [place] [address], [place] Phone: (336) 878-6027   °[facility] [address], [place], [place] (336)723-1848, Ext. 123 Mondays & Thursdays: 7-9 AM.  First 15 patients are seen on a first come, first serve basis. °  ° °Medicaid-accepting [place] Providers: ° °Organization         Address  Phone   Notes  °[facility] [address], [place] (336) 641-2100 Also accepts self-pay patients.  °[facility] [address], [place] ° (336) 856-9996   °[facility] [address], [place]  (336) 288-8857   °[facility] [address] [place] [address], [place] (336) 299-7000   °Veita Bland [address], [place]  ° (336) 373-1557 Only accepts  Access Medicaid patients after they have their name applied to their card.  ° °Self-Pay (no insurance) in [place]: ° °Organization         Address  Phone   Notes  °Sickle Cell Patients, [facility] [address], [place] (336) 832-1970   °[facility] [address], [place] (336) 832-4400   °[facility] ° [address], [place] (336) 992-4800   °[facility]/Dr. Osei-Bonsu ° [address] [place] [address] [place] or [address], [place] (336) [phone] Phone number for both [place] and [place] locations is the same.  °[facility] [address], [place] (336) 299-0000   °[facility] [address] [place] [address], [place] or [address] (336) 852-7530 °(336) 878-2260   °[facility] [address], [place] (336) 350-1642, phone; (336) 294-5005, fax Sees patients 1st and 3rd Saturday of every month.  Must not qualify for public or private insurance (i.e. Medicaid, Medicare, [place] Health Choice, Veterans' Benefits) • Household income should be no more than 200% of the poverty level •The clinic cannot treat you if you are pregnant or think you are pregnant • Sexually transmitted diseases are not treated at the clinic.  ° ° °Dental Care: °Organization         Address  Phone  Notes  °[place] [facility] [address], [place] (336) 641-6152 Accepts children up to age 21 who are enrolled in Medicaid or [place] Health Choice; pregnant women with a Medicaid card; and children who have applied for Medicaid or [place] Health Choice, but were declined, whose parents can pay a reduced fee at time of service.  °[place]  [facility] [place]  [address], [place] (336) 641-7733 Accepts children up to age 21 who are enrolled in Medicaid or [place] Health Choice; pregnant women with a Medicaid card; and children who have applied for Medicaid or [place] Health Choice, but were declined, whose parents can pay a reduced fee at time of service.  °[facility] ° [address], [place] (336) 641-4533 Patients are seen by appointment only. Walk-ins are not accepted. [facility] will see patients 18 years of age and older. °Monday - Tuesday (8am-5pm) °Most Wednesdays (8:30-5pm) °$30 per visit, cash only  °[facility] ° [address], [place] (336) [phone] Patients are seen by appointment only. Walk-ins are not accepted. [facility] will see patients 18 years of age and older. °One   Wednesday Evening (Monthly: Volunteer Based).  $30 per visit, cash only  °[facility]  (919) [phone] for adults; Children under age 4, call Graduate Pediatric Dentistry at ([phone]) [phone]. Children aged 4-14, please call ([phone]) [phone] to request a pediatric application. ° Dental services are provided in all areas of dental care including fillings, crowns and bridges, complete and partial dentures, implants, gum treatment, root canals, and extractions. Preventive care is also provided. Treatment is provided to both adults and children. °Patients are selected via a lottery and there is often a waiting list. °  °[facility] [address], °[place] ° (336) 763-8833 [email or web address] °  °[facility] [address], [place], [place] (336)723-1848, Ext. 123 Second and Fourth Thursday of each month, opens at 6:30 AM; Clinic ends at 9 AM.  Patients are seen on a first-come first-served basis, and a limited number are seen during each clinic.  ° °[facility] ° [address], [place], [place] (336) 723-7904    Eligibility Requirements °You must have lived in [place], or [place] counties for at least the last three months. °  You cannot be eligible for state or federal sponsored healthcare insurance, including Veterans Administration, Medicaid, or Medicare. °  You generally cannot be eligible for healthcare insurance through your employer.  °  How to apply: °Eligibility screenings are held every Tuesday and Wednesday afternoon from 1:00 pm until 4:00 pm. You do not need an appointment for the interview!  °[facility] [address], [place], [place] 336-631-2330   °[place] [facility]  336-342-8273   °[place] [facility]  336-703-3100   °[facility]  336-570-6415   ° °Behavioral Health Resources in the Community: °Intensive Outpatient Programs °Organization         Address  Phone  Notes  °[facility] [address], [place], [place] 336-878-6098   °[facility] [address], [place], [place] 336-832-9800   °[facility] [address], [place], [place] ° 336-882-2125   °[facility] [address],  °[place], [place] 1-800-853-5163 or 336-641-4981   °Substance Abuse Resources °Organization         Address  Phone  Notes  °[facility]  336-882-2125   °[facility]  336-784-9470   °[facility]  336-285-9073   °[facility]  336-845-3988   °Residential & Outpatient Substance Abuse Program  1-800-659-3381   °Psychological Services °Organization         Address  Phone  Notes  °[facility]  336- 832-9600   °[facility]  336- 378-7881   °[facility] [address], [place] 1-800-853-5163 or 336-641-4981   ° °Mobile Crisis Teams °Organization         Address  Phone  Notes  °[facility], Mobile Crisis Care Unit  1-877-626-1772   °Assertive °[facility] ° [address]  [place], [place] 336-834-9664   °Sharon DeEsch [address] °[place] 336-554-5454   ° °Self-Help/Support Groups °Organization         Address  Phone             Notes  °[facility] - variety of support groups  336- 373-1402 Call for more information  °Narcotics Anonymous (NA), [facility] [address], °[place]  2 meetings at this location  ° °  Residential Treatment Programs °Organization         Address  Phone  Notes  °[facility] [address],    °[place]  1-866-801-8205   °[facility] ° [address], [place], [place] 704-293-8524   °[facility] [address], [place] 336-845-3988 Admissions: 8am-3pm M-F  °[facility] [address]    °[place], [place] 336-841-1104   °[facility] [address], [place], [place] 336-379-7146   °[facility] [address]  °[place], [place] 336-285-9073   °[facility] Intensive Outpatient [address], [place], [place] 336-852-3033   °[facility] [address]  °[place], [place] 1-877-615-2722 or 336-784-9470   °[facility] ([facility] [address] [place], [place] 336-227-7417 Accepts Medicaid  °[facility] [address]  °[place] 1-800-659-3381 Substance Abuse/Addiction Treatment  ° °[place] Behavioral Health Resources °Organization         Address  Phone  Notes  °[facility]  (888) 581-9988   °Julie Brannon, PhD [address] [place], [place]   (336) 349-5553 or (336) 951-0000   °[facility]   [address] °[place], [place] (336) 349-4454   °[facility] [address], [place], [place] (336) 342-8316 Insurance/Medicaid/sponsorship through [facility]  °[facility] [address]                                    [place], [place] (336) 342-8316 Therapy/tele-psych/case    °[facility] [address]  ° [place], [place] (336) 349-2233    °Dr. Arfeen  (336) 349-4544   °[facility] [place]  [employer] [place] [facility] 1) [address],  °2) [address], [place] °3)  [address], [place] (336) 349-3220 °(336) 342-7768 ° °(336) 342-8140   °[place] [facility] (336) 342-1394 or (336) 342-3537 (After Hours)    ° ° °

## 2013-10-24 NOTE — ED Notes (Signed)
Patient returned from xray dept.

## 2013-10-24 NOTE — ED Notes (Signed)
States his BP is 194/127. Headache. Has not taken medication in years.

## 2015-03-02 ENCOUNTER — Encounter (HOSPITAL_BASED_OUTPATIENT_CLINIC_OR_DEPARTMENT_OTHER): Payer: Self-pay

## 2015-03-02 ENCOUNTER — Emergency Department (HOSPITAL_BASED_OUTPATIENT_CLINIC_OR_DEPARTMENT_OTHER)
Admission: EM | Admit: 2015-03-02 | Discharge: 2015-03-03 | Disposition: A | Discharge status: Home / Self Care | Payer: BLUE CROSS/BLUE SHIELD | Attending: Emergency Medicine | Admitting: Emergency Medicine

## 2015-03-02 DIAGNOSIS — I10 Essential (primary) hypertension: Secondary | ICD-10-CM | POA: Insufficient documentation

## 2015-03-02 DIAGNOSIS — K029 Dental caries, unspecified: Secondary | ICD-10-CM | POA: Insufficient documentation

## 2015-03-02 DIAGNOSIS — Z79899 Other long term (current) drug therapy: Secondary | ICD-10-CM | POA: Insufficient documentation

## 2015-03-02 DIAGNOSIS — K0381 Cracked tooth: Secondary | ICD-10-CM | POA: Insufficient documentation

## 2015-03-02 DIAGNOSIS — K0889 Other specified disorders of teeth and supporting structures: Secondary | ICD-10-CM | POA: Diagnosis not present

## 2015-03-02 MED ORDER — NAPROXEN 500 MG PO TABS: 500.0000 mg | ORAL_TABLET | Freq: Two times a day (BID) | ORAL | Status: DC

## 2015-03-02 MED ORDER — PENICILLIN V POTASSIUM 500 MG PO TABS: 500.0000 mg | ORAL_TABLET | Freq: Four times a day (QID) | ORAL | Status: AC

## 2015-03-02 MED ORDER — KETOROLAC TROMETHAMINE 60 MG/2ML IM SOLN
60.0000 mg | Freq: Once | INTRAMUSCULAR | Status: AC
  Administered 2015-03-02: 60 mg via INTRAMUSCULAR
  Filled 2015-03-02: qty 2

## 2015-03-02 NOTE — Discharge Instructions (Signed)
Dental Pain °Dental pain may be caused by many things, including: °· Tooth decay (cavities or caries). Cavities expose the nerve of your tooth to air and hot or cold temperatures. This can cause pain or discomfort. °· Abscess or infection. A dental abscess is a collection of infected pus from a bacterial infection in the inner part of the tooth (pulp). It usually occurs at the end of the tooth's root. °· Injury. °· An unknown reason (idiopathic). °Your pain may be mild or severe. It may only occur when: °· You are chewing. °· You are exposed to hot or cold temperature. °· You are eating or drinking sugary foods or beverages, such as soda or candy. °Your pain may also be constant. °HOME CARE INSTRUCTIONS °Watch your dental pain for any changes. The following actions may help to lessen any discomfort that you are feeling: °· Take medicines only as directed by your dentist. °· If you were prescribed an antibiotic medicine, finish all of it even if you start to feel better. °· Keep all follow-up visits as directed by your dentist. This is important. °· Do not apply heat to the outside of your face. °· Rinse your mouth or gargle with salt water if directed by your dentist. This helps with pain and swelling. °¨ You can make salt water by adding ¼ tsp of salt to 1 cup of warm water. °· Apply ice to the painful area of your face: °¨ Put ice in a plastic bag. °¨ Place a towel between your skin and the bag. °¨ Leave the ice on for 20 minutes, 2-3 times per day. °· Avoid foods or drinks that cause you pain, such as: °¨ Very hot or very cold foods or drinks. °¨ Sweet or sugary foods or drinks. °SEEK MEDICAL CARE IF: °· Your pain is not controlled with medicines. °· Your symptoms are worse. °· You have new symptoms. °SEEK IMMEDIATE MEDICAL CARE IF: °· You are unable to open your mouth. °· You are having trouble breathing or swallowing. °· You have a fever. °· Your face, neck, or jaw is swollen. °  °This information is not  intended to replace advice given to you by your health care provider. Make sure you discuss any questions you have with your health care provider. °  °Document Released: 02/14/2005 Document Revised: 07/01/2014 Document Reviewed: 02/10/2014 °Elsevier Interactive Patient Education ©2016 Elsevier Inc. ° °Emergency Department Resource Guide °1) Find a Doctor and Pay Out of Pocket °Although you won't have to find out who is covered by your insurance plan, it is a good idea to ask around and get recommendations. You will then need to call the office and see if the doctor you have chosen will accept you as a new patient and what types of options they offer for patients who are self-pay. Some doctors offer discounts or will set up payment plans for their patients who do not have insurance, but you will need to ask so you aren't surprised when you get to your appointment. ° °2) Contact Your Local Health Department °Not all health departments have doctors that can see patients for sick visits, but many do, so it is worth a call to see if yours does. If you don't know where your local health department is, you can check in your phone book. The CDC also has a tool to help you locate your state's health department, and many state websites also have listings of all of their local health departments. ° °3) Find   a Walk-in Clinic °If your illness is not likely to be very severe or complicated, you may want to try a walk in clinic. These are popping up all over the country in pharmacies, drugstores, and shopping centers. They're usually staffed by nurse practitioners or physician assistants that have been trained to treat common illnesses and complaints. They're usually fairly quick and inexpensive. However, if you have serious medical issues or chronic medical problems, these are probably not your best option. ° °No Primary Care Doctor: °- Call Health Connect at  832-8000 - they can help you locate a primary care doctor that   accepts your insurance, provides certain services, etc. °- Physician Referral Service- 1-800-533-3463 ° °Chronic Pain Problems: °Organization         Address  Phone   Notes  ° [facility]  (336) 297-2271 Patients need to be referred by their primary care doctor.  ° °Medication Assistance: °Organization         Address  Phone   Notes  °[place] [facility] [address] °[place], [place] [address] (336) 641-8030 --Must be a resident of [place] °-- Must have NO insurance coverage whatsoever (no Medicaid/ Medicare, etc.) °-- The pt. MUST have a primary care doctor that directs their care regularly and follows them in the community °  °[facility]  (866) 331-1348   °[employer]  (888) 892-1162   ° °Agencies that provide inexpensive medical care: °Organization         Address  Phone   Notes  °[facility]  (336) 832-8035   °[facility]    (336) 832-7272   °[facility] [address] °[place], [place] [address] (336) 832-4777   °[facility] [address], °[place] (336) 271-4999   °[facility]    (336) 373-0678   °[facility]    (336) 272-1050   °[facility] ° [address], [place] Phone:  (336) 832-4444, Fax:  (336) 832-4440 Hours of Operation:  9 am - 6 pm, M-F.  Also accepts Medicaid/Medicare and self-pay.  °[facility] ° [address], [place] Phone: (336) 832-3150, Fax: (336) 832-3151. Hours of Operation:  8:30 am - 5:30 pm, M-F.  Also accepts Medicaid and self-pay.  °[facility] [place] [address], [place] Phone: (336) 878-6027   °[facility] [address], [place], [place] (336)723-1848, Ext. 123 Mondays & Thursdays: 7-9 AM.  First 15 patients are seen on a first come, first serve basis. °  ° °Medicaid-accepting [place] Providers: ° °Organization          Address  Phone   Notes  °[facility] [address], [place] (336) 641-2100 Also accepts self-pay patients.  °[facility] [address], [place] ° (336) 856-9996   °[facility] [address], [place] (336) 288-8857   °[facility] [address] [place] [address], [place] (336) 299-7000   °Veita Bland [address],   ° (336) 373-1557 Only accepts [place] Access Medicaid patients after they have their name applied to their card.  ° °Self-Pay (no insurance) in [place]: ° °Organization         Address  Phone   Notes  °Sickle Cell Patients, [facility] [address], [place] (336) 832-1970   °[facility] [address], [place] (336) 832-4400   °[facility] ° [address],  (336) 992-4800   °[facility]/Dr. Osei-Bonsu ° [address] [place] [address], [place] or [address], [place] (336) 841-8500 Phone number for both [place] and [place] locations is the same.  °[facility] [address], [place] (336) 299-0000   °[facility] [address] [place] [address], [place] or [address] (336) 852-7530 °(336) 878-2260   °[facility] [address], [place] (336) 350-1642, phone; (336) 294-5005, fax Sees patients 1st and 3rd Saturday of every month.  Must not qualify for public or private insurance (i.e. Medicaid, Medicare, [place] Health Choice, Veterans' Benefits) • Household income should be no more than 200% of the poverty level •The clinic cannot treat you if you are pregnant or think you are pregnant • Sexually transmitted diseases are not treated at the clinic.  ° ° °Dental Care: °Organization         Address  Phone  Notes  °[facility] [address],  [place] (336) 641-6152 Accepts children up to age 21 who are enrolled in Medicaid or [place] Health Choice; pregnant women with a Medicaid card; and children who have applied for Medicaid or [place] Health Choice, but were declined, whose parents can pay a reduced fee at time of service.  °[facility] [place]  [address], [place] (336) 641-7733 Accepts children up to age 21 who are enrolled in Medicaid or [place] Health Choice; pregnant women with a Medicaid card; and children who have applied for Medicaid or [place] Health Choice, but were declined, whose parents can pay a reduced fee at time of service.  °[facility] ° [address], [place] (336) 641-4533 Patients are seen by appointment only. Walk-ins are not accepted. [facility] will see patients 18 years of age and older. °Monday - Tuesday (8am-5pm) °Most Wednesdays (8:30-5pm) °$30 per visit, cash only  °[facility] ° [address], [place] (336) [phone] Patients are seen by appointment only. Walk-ins are not accepted. [facility] will see patients 18 years of age and older. °One Wednesday Evening (Monthly: Volunteer Based).  $30 per visit, cash only  °[facility]  (919) [phone] for adults; Children under age 4, call Graduate Pediatric Dentistry at ([phone]) [phone]. Children aged 4-14, please call ([phone]) [phone] to request a pediatric application. ° Dental services are provided in all areas of dental care including fillings, crowns and bridges, complete and partial dentures, implants, gum treatment, root canals, and extractions. Preventive care is also provided. Treatment is provided to both adults and children. °Patients are selected via a lottery and there is often a waiting list. °  °[facility] [address], °[place] ° (336) 763-8833 [email or web address] °  °[facility] [address], [place], [place]  (336)723-1848, Ext. 123 Second and Fourth Thursday of each month, opens at 6:30 AM; Clinic ends at 9 AM.  Patients are seen on a first-come first-served basis, and a limited number are seen during each clinic.  ° °[facility] ° [address], [place],  (336) [phone]   Eligibility Requirements °You must have lived in [place], or   [place] for at least the last three months. °  You cannot be eligible for state or federal sponsored healthcare insurance, including Veterans Administration, Medicaid, or Medicare. °  You generally cannot be eligible for healthcare insurance through your employer.  °  How to apply: °Eligibility screenings are held every Tuesday and Wednesday afternoon from 1:00 pm until 4:00 pm. You do not need an appointment for the interview!  °[facility] [address], [place], [place] 336-631-2330   °[facility]  336-342-8273   °[facility]  336-703-3100   °[facility]  336-570-6415   ° °

## 2015-03-02 NOTE — ED Notes (Signed)
Pt reports left upper and lower dental pain x1 week, poor dental hygiene with caries noted.

## 2015-03-02 NOTE — ED Provider Notes (Signed)
CSN: [id number]     Arrival date & time 03/02/15  2257 History  By signing my name below, I, Budd PalmerVanessa Prueter, attest that this documentation has been prepared under the direction and in the presence of Shon Batonourtney F Horton, MD. Electronically Signed: Budd PalmerVanessa Prueter, ED Scribe. 03/02/2015. 11:37 PM.     Chief Complaint  Patient presents with  . Dental Pain   The history is provided by the patient. No language interpreter was used.   HPI Comments: Todd Pena is a 30 y.o. male with a PMHx of HTN who presents to the Emergency Department complaining of constant, throbbing, left-sided lower dental pain onset 1 week ago. He currently rates his pain as a 3/10. He notes he also has poor dentition. He states he has been taking ibuprofen and tylenol for this today. He reports alleviation of the pain with swishing ice-cold water in his mouth. He notes he does not have a dentist. He states he takes Amlodipine for HTN; been out of this medication for one month. He denies any other medical issues. Pt denies difficulty swallowing.   Past Medical History  Diagnosis Date  . Hypertension    History reviewed. No pertinent past surgical history. History reviewed. No pertinent family history. Social History  Substance Use Topics  . Smoking status: Never Smoker   . Smokeless tobacco: None  . Alcohol Use: No    Review of Systems  Constitutional: Negative for fever.  HENT: Positive for dental problem. Negative for sore throat and trouble swallowing.   All other systems reviewed and are negative.   Allergies  Review of patient's allergies indicates no known allergies.  Home Medications   Prior to Admission medications   Medication Sig Start Date End Date Taking? Authorizing Provider  amLODipine-benazepril (LOTREL) 5-10 MG per capsule Take 1 capsule by mouth daily. 10/24/13  Yes Purvis SheffieldForrest Harrison, MD  HYDROcodone-acetaminophen (NORCO/VICODIN) 5-325 MG per tablet Take 2 tablets by mouth every 4 (four)  hours as needed. 08/29/13   Glynn OctaveStephen Rancour, MD  ibuprofen (ADVIL,MOTRIN) 800 MG tablet Take 1 tablet (800 mg total) by mouth 3 (three) times daily. 08/29/13   Glynn OctaveStephen Rancour, MD  naproxen (NAPROSYN) 500 MG tablet Take 1 tablet (500 mg total) by mouth 2 (two) times daily. 03/02/15   Shon Batonourtney F Horton, MD  penicillin v potassium (VEETID) 500 MG tablet Take 1 tablet (500 mg total) by mouth 4 (four) times daily. 03/02/15 03/09/15  Shon Batonourtney F Horton, MD   BP 171/127 mmHg  Pulse 84  Temp(Src) 98.5 F (36.9 C) (Oral)  Resp 18  Ht 5\' 10"  (1.778 m)  Wt 268 lb 14.4 oz (121.972 kg)  BMI 38.58 kg/m2  SpO2 98% Physical Exam  Constitutional: He is oriented to person, place, and time. He appears well-developed and well-nourished. No distress.  HENT:  Head: Normocephalic and atraumatic.  Multiple dental caries, chronically fractured left lower molar, no palpable abscess, no trismus, no fullness noted under the tongue  Cardiovascular: Normal rate and regular rhythm.   Pulmonary/Chest: Effort normal. No respiratory distress.  Musculoskeletal: He exhibits no edema.  Neurological: He is alert and oriented to person, place, and time.  Skin: Skin is warm and dry.  Psychiatric: He has a normal mood and affect.  Nursing note and vitals reviewed.   ED Course  Procedures  DIAGNOSTIC STUDIES: Oxygen Saturation is 98% on RA, normal by my interpretation.    COORDINATION OF CARE: 11:36 PM - Discussed plans to order antibiotics and pain medication. Advised pt  that the tooth will need to be removed, and to continue with regular anti-inflammatory medication. Pt advised of plan for treatment and pt agrees.  Labs Review Labs Reviewed - No data to display  Imaging Review No results found. I have personally reviewed and evaluated these images and lab results as part of my medical decision-making.   EKG Interpretation None      MDM   Final diagnoses:  Pain, dental    Patient presents with dental pain. Likely  related to dental caries and chronically fractured left lower molar. No obvious drainable abscess at this time. Discussed with patient anti-inflammatory medications at home and antibiotics. Of note, noted to be hypertensive 171/127. Patient has been his amlodipine for one month. He is otherwise asymptomatic. Reports "I'm going back to my doctor." Given that he is otherwise asymptomatic, will have him follow-up with his primary physician.  After history, exam, and medical workup I feel the patient has been appropriately medically screened and is safe for discharge home. Pertinent diagnoses were discussed with the patient. Patient was given return precautions.  I personally performed the services described in this documentation, which was scribed in my presence. The recorded information has been reviewed and is accurate.   Shon Baton, MD 03/02/15 [id number]

## 2015-04-27 ENCOUNTER — Encounter (HOSPITAL_BASED_OUTPATIENT_CLINIC_OR_DEPARTMENT_OTHER): Payer: Self-pay | Admitting: Emergency Medicine

## 2015-04-27 ENCOUNTER — Emergency Department (HOSPITAL_BASED_OUTPATIENT_CLINIC_OR_DEPARTMENT_OTHER)
Admission: EM | Admit: 2015-04-27 | Discharge: 2015-04-27 | Disposition: A | Discharge status: Home / Self Care | Payer: BLUE CROSS/BLUE SHIELD | Attending: Emergency Medicine | Admitting: Emergency Medicine

## 2015-04-27 DIAGNOSIS — R0981 Nasal congestion: Secondary | ICD-10-CM | POA: Diagnosis present

## 2015-04-27 DIAGNOSIS — J020 Streptococcal pharyngitis: Secondary | ICD-10-CM | POA: Insufficient documentation

## 2015-04-27 DIAGNOSIS — Z79899 Other long term (current) drug therapy: Secondary | ICD-10-CM | POA: Diagnosis not present

## 2015-04-27 DIAGNOSIS — Z791 Long term (current) use of non-steroidal anti-inflammatories (NSAID): Secondary | ICD-10-CM | POA: Insufficient documentation

## 2015-04-27 DIAGNOSIS — I10 Essential (primary) hypertension: Secondary | ICD-10-CM | POA: Diagnosis not present

## 2015-04-27 LAB — RAPID STREP SCREEN (MED CTR MEBANE ONLY): Streptococcus, Group A Screen (Direct): POSITIVE — AB

## 2015-04-27 MED ORDER — AMOXICILLIN 500 MG PO CAPS: 500.0000 mg | ORAL_CAPSULE | Freq: Three times a day (TID) | ORAL | Status: DC

## 2015-04-27 MED ORDER — AMLODIPINE BESY-BENAZEPRIL HCL 5-10 MG PO CAPS: 1.0000 | ORAL_CAPSULE | Freq: Every day | ORAL | Status: DC

## 2015-04-27 MED FILL — AMOXICILLIN 500 MG CAPSULE: 500 | 10 days supply | Qty: 30 | Fill #0

## 2015-04-27 MED FILL — AMLODIPINE-BENAZEPRIL 5-10: 5-10 | 30 days supply | Qty: 30 | Fill #0

## 2015-04-27 NOTE — ED Provider Notes (Signed)
CSN: [id number]     Arrival date & time 04/27/15  0844 History   First MD Initiated Contact with Patient 04/27/15 [phone]     Chief Complaint  Patient presents with  . Nasal Congestion     (Consider location/radiation/quality/duration/timing/severity/associated sxs/prior Treatment) Patient is a 30 y.o. male presenting with pharyngitis. No language interpreter was used.  Sore Throat This is a new problem. The current episode started yesterday. The problem occurs constantly. The problem has been gradually worsening. Associated symptoms include a sore throat. Nothing aggravates the symptoms. He has tried nothing for the symptoms. The treatment provided moderate relief.  Pt complains of a sore throat and fever  Past Medical History  Diagnosis Date  . Hypertension    History reviewed. No pertinent past surgical history. History reviewed. No pertinent family history. Social History  Substance Use Topics  . Smoking status: Never Smoker   . Smokeless tobacco: None  . Alcohol Use: No    Review of Systems  HENT: Positive for sore throat.   All other systems reviewed and are negative.     Allergies  Review of patient's allergies indicates no known allergies.  Home Medications   Prior to Admission medications   Medication Sig Start Date End Date Taking? Authorizing Provider  amLODipine-benazepril (LOTREL) 5-10 MG per capsule Take 1 capsule by mouth daily. 10/24/13   Purvis Sheffield, MD  ibuprofen (ADVIL,MOTRIN) 800 MG tablet Take 1 tablet (800 mg total) by mouth 3 (three) times daily. 08/29/13   Glynn Octave, MD  naproxen (NAPROSYN) 500 MG tablet Take 1 tablet (500 mg total) by mouth 2 (two) times daily. 03/02/15   Shon Baton, MD   BP 163/120 mmHg  Pulse 92  Temp(Src) 99 F (37.2 C) (Oral)  Resp 16  Ht  (1.778 m)  Wt 121.564 kg  BMI 38.45 kg/m2  SpO2 98% Physical Exam  Constitutional: He is oriented to person, place, and time. He appears well-developed and  well-nourished.  HENT:  Head: Normocephalic and atraumatic.  Right Ear: External ear normal.  Mouth/Throat: Oropharynx is clear and moist.  Erythema posterior pharynx.  exudate  Eyes: EOM are normal.  Neck: Normal range of motion.  Cardiovascular: Normal rate.   Pulmonary/Chest: Effort normal.  Abdominal: He exhibits no distension.  Musculoskeletal: Normal range of motion.  Neurological: He is alert and oriented to person, place, and time.  Psychiatric: He has a normal mood and affect.  Nursing note and vitals reviewed.   ED Course  Procedures (including critical care time) Labs Review Labs Reviewed  RAPID STREP SCREEN (NOT AT [facility])    Imaging Review No results found. I have personally reviewed and evaluated these images and lab results as part of my medical decision-making.   EKG Interpretation None      MDM   Final diagnoses:  Strep pharyngitis    amoxicillain An After Visit Summary was printed and given to the patient.    Lonia Skinner [place], PA-C 04/27/15 1014  Azalia Bilis, MD 04/27/15 1017

## 2015-04-27 NOTE — ED Notes (Signed)
Pt with cough, congestion, x 4 days took 2 bottles of mucinex with no improvement

## 2015-04-27 NOTE — Discharge Instructions (Signed)
Pharyngitis °Pharyngitis is redness, pain, and swelling (inflammation) of your pharynx.  °CAUSES  °Pharyngitis is usually caused by infection. Most of the time, these infections are from viruses (viral) and are part of a cold. However, sometimes pharyngitis is caused by bacteria (bacterial). Pharyngitis can also be caused by allergies. Viral pharyngitis may be spread from person to person by coughing, sneezing, and personal items or utensils (cups, forks, spoons, toothbrushes). Bacterial pharyngitis may be spread from person to person by more intimate contact, such as kissing.  °SIGNS AND SYMPTOMS  °Symptoms of pharyngitis include:   °· Sore throat.   °· Tiredness (fatigue).   °· Low-grade fever.   °· Headache. °· Joint pain and muscle aches. °· Skin rashes. °· Swollen lymph nodes. °· Plaque-like film on throat or tonsils (often seen with bacterial pharyngitis). °DIAGNOSIS  °Your health care provider will ask you questions about your illness and your symptoms. Your medical history, along with a physical exam, is often all that is needed to diagnose pharyngitis. Sometimes, a rapid strep test is done. Other lab tests may also be done, depending on the suspected cause.  °TREATMENT  °Viral pharyngitis will usually get better in 3-4 days without the use of medicine. Bacterial pharyngitis is treated with medicines that kill germs (antibiotics).  °HOME CARE INSTRUCTIONS  °· Drink enough water and fluids to keep your urine clear or pale yellow.   °· Only take over-the-counter or prescription medicines as directed by your health care provider:   °· If you are prescribed antibiotics, make sure you finish them even if you start to feel better.   °· Do not take aspirin.   °· Get lots of rest.   °· Gargle with 8 oz of salt water (½ tsp of salt per 1 qt of water) as often as every 1-2 hours to soothe your throat.   °· Throat lozenges (if you are not at risk for choking) or sprays may be used to soothe your throat. °SEEK MEDICAL  CARE IF:  °· You have large, tender lumps in your neck. °· You have a rash. °· You cough up green, yellow-brown, or bloody spit. °SEEK IMMEDIATE MEDICAL CARE IF:  °· Your neck becomes stiff. °· You drool or are unable to swallow liquids. °· You vomit or are unable to keep medicines or liquids down. °· You have severe pain that does not go away with the use of recommended medicines. °· You have trouble breathing (not caused by a stuffy nose). °MAKE SURE YOU:  °· Understand these instructions. °· Will watch your condition. °· Will get help right away if you are not doing well or get worse. °  °This information is not intended to replace advice given to you by your health care provider. Make sure you discuss any questions you have with your health care provider. °  °Document Released: 02/14/2005 Document Revised: 12/05/2012 Document Reviewed: 10/22/2012 °Elsevier Interactive Patient Education ©2016 [employer] ° °Rapid Strep Test °Strep throat is a bacterial infection caused by the bacteria Streptococcus pyogenes. A rapid strep test is the quickest way to check if these bacteria are causing your sore throat. The test can be done at your health care provider's office. Results are usually ready in 10-20 minutes. °You may have this test if you have symptoms of strep throat. These include:  °· A red throat with yellow or white spots. °· Neck swelling and tenderness. °· Fever. °· Loss of appetite. °· Trouble breathing or swallowing. °· Rash. °· Dehydration. °This test requires a sample of fluid from the   back of your throat and tonsils. Your health care provider may hold down your tongue with a tongue depressor and use a swab to collect the sample.  °Your health care provider may collect a second sample at the same time. The second sample may be used for a throat culture. In a culture test, the sample is combined with a substance that encourages bacteria to grow. It takes longer to get the results of the throat culture  test, but they are more accurate. They can confirm the results from a rapid strep test, or show that those results were wrong. °RESULTS  °It is your responsibility to obtain your test results. Ask the lab or department performing the test when and how you will get your results. Contact your health care provider to discuss any questions you have about your results.  °The results of the rapid strep test will be negative or positive.  °Meaning of Negative Test Results °If the result of your rapid strep test is negative, then it means:  °· It is likely that you do not have strep throat. °· A virus may be causing your sore throat. °Your health care provider may do a throat culture to confirm the results of the rapid strep test. The throat culture can also identify the different strains of strep bacteria. °Meaning of Positive Test Results °If the result of your rapid strep test is positive, then it means: °· It is likely that you do have strep throat. °· You may have to take antibiotics. °Your health care provider may do a throat culture to confirm the results of the rapid strep test. Strep throat usually requires a course of antibiotics.  °  °This information is not intended to replace advice given to you by your health care provider. Make sure you discuss any questions you have with your health care provider. °  °Document Released: 03/24/2004 Document Revised: 03/07/2014 Document Reviewed: 05/23/2013 °Elsevier Interactive Patient Education ©2016 [employer] ° °

## 2016-01-24 ENCOUNTER — Emergency Department (HOSPITAL_BASED_OUTPATIENT_CLINIC_OR_DEPARTMENT_OTHER)
Admission: EM | Admit: 2016-01-24 | Discharge: 2016-01-25 | Disposition: A | Discharge status: Home / Self Care | Payer: BLUE CROSS/BLUE SHIELD | Attending: Emergency Medicine | Admitting: Emergency Medicine

## 2016-01-24 ENCOUNTER — Encounter (HOSPITAL_BASED_OUTPATIENT_CLINIC_OR_DEPARTMENT_OTHER): Payer: Self-pay | Admitting: Adult Health

## 2016-01-24 DIAGNOSIS — R519 Headache, unspecified: Secondary | ICD-10-CM

## 2016-01-24 DIAGNOSIS — R51 Headache: Secondary | ICD-10-CM

## 2016-01-24 DIAGNOSIS — Z9114 Patient's other noncompliance with medication regimen: Secondary | ICD-10-CM | POA: Insufficient documentation

## 2016-01-24 DIAGNOSIS — Z791 Long term (current) use of non-steroidal anti-inflammatories (NSAID): Secondary | ICD-10-CM | POA: Insufficient documentation

## 2016-01-24 DIAGNOSIS — Z79899 Other long term (current) drug therapy: Secondary | ICD-10-CM | POA: Insufficient documentation

## 2016-01-24 DIAGNOSIS — I10 Essential (primary) hypertension: Secondary | ICD-10-CM | POA: Insufficient documentation

## 2016-01-24 NOTE — ED Triage Notes (Signed)
Presents with headache for 2 days and high blood pressure. PT has been out of BP medication for a few months. BP here 173/122, denies dizziness. reorts that is about normal for him. He has not been able to get a refill on BP medication. Denies CP, denies blurred vision. Alert and oriented.

## 2016-01-25 MED ORDER — BENAZEPRIL HCL 10 MG PO TABS: 10.0000 mg | ORAL_TABLET | Freq: Every day | ORAL | 0 refills | Status: DC

## 2016-01-25 MED ORDER — AMLODIPINE BESYLATE 5 MG PO TABS: 5.0000 mg | ORAL_TABLET | Freq: Every day | ORAL | 0 refills | Status: DC

## 2016-01-25 NOTE — ED Provider Notes (Signed)
[facility]   CSN: [id number] Arrival date & time: 01/24/16  2227  By signing my name below, I, Rosario AdieWilliam Andrew Hiatt, attest that this documentation has been prepared under the direction and in the presence of Dione Boozeavid Rihanna Marseille, MD. Electronically Signed: Rosario AdieWilliam Andrew Hiatt, ED Scribe. 01/25/16. 12:06 AM.  History   Chief Complaint Chief Complaint  Patient presents with  . Hypertension   The history is provided by the patient. No language interpreter was used.   HPI Comments: Alverda SkeansDaron L Yurchak is a 30 y.o. male with a PMHx of HTN, who presents to the Emergency Department complaining of intermittent, frontal headache onset approximately 3 days ago. Pt reports that he was at church this evening when a congregation member took his BP, and at that time she noted that it was high. Pt has previously been medicated for his h/o HTN, but reports that he has been out of his medication for approximately 3 months. No noted treatments were tried prior to coming into the ED for his headache. He denies visual disturbance, chest pain, dizziness, or any other associated symptoms.   Past Medical History:  Diagnosis Date  . Hypertension    There are no active problems to display for this patient.  History reviewed. No pertinent surgical history.  Home Medications    Prior to Admission medications   Medication Sig Start Date End Date Taking? Authorizing Provider  amLODipine-benazepril (LOTREL) 5-10 MG capsule Take 1 capsule by mouth daily. 04/27/15   Azalia BilisKevin Campos, MD  amoxicillin (AMOXIL) 500 MG capsule Take 1 capsule (500 mg total) by mouth 3 (three) times daily. 04/27/15   Elson AreasLeslie K Sofia, PA-C  ibuprofen (ADVIL,MOTRIN) 800 MG tablet Take 1 tablet (800 mg total) by mouth 3 (three) times daily. 08/29/13   Glynn OctaveStephen Rancour, MD  naproxen (NAPROSYN) 500 MG tablet Take 1 tablet (500 mg total) by mouth 2 (two) times daily. 03/02/15   Shon Batonourtney F Horton, MD   Family History History reviewed.  No pertinent family history.  Social History Social History  Substance Use Topics  . Smoking status: Never Smoker  . Smokeless tobacco: Not on file  . Alcohol use No   Allergies   Patient has no known allergies.  Review of Systems Review of Systems  Eyes: Negative for visual disturbance.  Cardiovascular: Negative for chest pain.  Neurological: Positive for headaches. Negative for dizziness.  All other systems reviewed and are negative.  Physical Exam Updated Vital Signs BP (!) 173/122 (BP Location: Left Arm)   Pulse 75   Temp 98.1 F (36.7 C) (Oral)   Resp 18   Ht 5\' 11"  (1.803 m)   Wt 270 lb (122.5 kg)   SpO2 97%   BMI 37.66 kg/m   Physical Exam  Constitutional: He is oriented to person, place, and time. He appears well-developed and well-nourished.  HENT:  Head: Normocephalic and atraumatic.  Eyes: EOM are normal. Pupils are equal, round, and reactive to light.  Fundi show no hemorrhage, exudate, or papilledema.  Neck: Normal range of motion. Neck supple. No JVD present.  Cardiovascular: Normal rate, regular rhythm and normal heart sounds.   No murmur heard. Pulmonary/Chest: Effort normal and breath sounds normal. He has no wheezes. He has no rales. He exhibits no tenderness.  Abdominal: Soft. Bowel sounds are normal. He exhibits no distension and no mass. There is no tenderness.  Musculoskeletal: Normal range of motion. He exhibits no edema.  Lymphadenopathy:    He has no cervical adenopathy.  Neurological: He is alert and oriented to person, place, and time. No cranial nerve deficit. He exhibits normal muscle tone. Coordination normal.  Skin: Skin is warm and dry. No rash noted.  Psychiatric: He has a normal mood and affect. His behavior is normal. Judgment and thought content normal.  Nursing note and vitals reviewed.  ED Treatments / Results  DIAGNOSTIC STUDIES: Oxygen Saturation is 97% on RA, normal by my interpretation.   COORDINATION OF CARE: 12:06  AM-Discussed next steps with pt. Pt verbalized understanding and is agreeable with the plan.   Procedures Procedures   Medications Ordered in ED Medications - No data to display  Initial Impression / Assessment and Plan / ED Course  I have reviewed the triage vital signs and the nursing notes.   Clinical Course    Uncontrolled hypertension secondary to medication noncompliance. Old records are reviewed, and he has had similar blood pressure elevations at every visit in [facility] went back to 2013. He is currently uninsured so I will write prescriptions for the individual components of his combination pill of which is considerably cheaper than getting the combination. Is given prescription for amlodipine and for been as a pill which [facility] and is given Engineer, building servicesfinancial resource guide. Importance of blood pressure control was stressed and he is encouraged to monitor his blood pressure at home. Since blood pressure is at what appears to be his baseline, I doubt his headache is related to blood pressure. I do not see any indication for urgent blood pressure reduction your diet is more important that he be started back on long-term antihypertension treatment. Importance of diet and exercise was also stressed.  Final Clinical Impressions(s) / ED Diagnoses   Final diagnoses:  Essential hypertension  Headache, unspecified headache type   New Prescriptions New Prescriptions   AMLODIPINE (NORVASC) 5 MG TABLET    Take 1 tablet (5 mg total) by mouth daily.   BENAZEPRIL (LOTENSIN) 10 MG TABLET    Take 1 tablet (10 mg total) by mouth daily.   I personally performed the services described in this documentation, which was scribed in my presence. The recorded information has been reviewed and is accurate.      Dione Boozeavid Lakesia Dahle, MD 01/25/16 [phone]

## 2016-01-25 NOTE — Discharge Instructions (Signed)
Monitor your blood pressure at home and keep a record of it.

## 2016-05-14 ENCOUNTER — Encounter (HOSPITAL_BASED_OUTPATIENT_CLINIC_OR_DEPARTMENT_OTHER): Payer: Self-pay | Admitting: Emergency Medicine

## 2016-05-14 ENCOUNTER — Emergency Department (HOSPITAL_BASED_OUTPATIENT_CLINIC_OR_DEPARTMENT_OTHER)
Admission: EM | Admit: 2016-05-14 | Discharge: 2016-05-14 | Disposition: A | Discharge status: Home / Self Care | Payer: Self-pay | Attending: Emergency Medicine | Admitting: Emergency Medicine

## 2016-05-14 DIAGNOSIS — Z79899 Other long term (current) drug therapy: Secondary | ICD-10-CM | POA: Insufficient documentation

## 2016-05-14 DIAGNOSIS — I10 Essential (primary) hypertension: Secondary | ICD-10-CM | POA: Insufficient documentation

## 2016-05-14 DIAGNOSIS — J029 Acute pharyngitis, unspecified: Secondary | ICD-10-CM | POA: Insufficient documentation

## 2016-05-14 LAB — RAPID STREP SCREEN (MED CTR MEBANE ONLY): Streptococcus, Group A Screen (Direct): NEGATIVE

## 2016-05-14 MED ORDER — CEPHALEXIN 250 MG PO CAPS
500.0000 mg | ORAL_CAPSULE | Freq: Once | ORAL | Status: AC
  Administered 2016-05-14: 500 mg via ORAL
  Filled 2016-05-14: qty 2

## 2016-05-14 MED ORDER — DEXAMETHASONE 1 MG/ML PO CONC
10.0000 mg | Freq: Once | ORAL | Status: AC
  Administered 2016-05-14: 10 mg via ORAL
  Filled 2016-05-14: qty 10

## 2016-05-14 MED ORDER — CEPHALEXIN 500 MG PO CAPS: 500.0000 mg | ORAL_CAPSULE | Freq: Two times a day (BID) | ORAL | 0 refills | Status: DC

## 2016-05-14 NOTE — Discharge Instructions (Signed)
Continue Tylenol/Ibuprofen for pain Take antibiotic with food twice daily for the next 10 days - you have already had one dose today Return for worsening symptoms

## 2016-05-14 NOTE — ED Triage Notes (Signed)
Sore throat since yesterday. Taken mucinex at home.

## 2016-05-14 NOTE — ED Notes (Signed)
PT discharged to home with family. NAD. 

## 2016-05-14 NOTE — ED Provider Notes (Signed)
[facility] Provider Note   CSN: [id number] Arrival date & time: 05/14/16  1508  By signing my name below, I, Todd Pena, attest that this documentation has been prepared under the direction and in the presence of non-physician practitioner, Terance Hart, PA-C. Electronically Signed: Modena Pena, Scribe. 05/14/2016. 5:46 PM.  History   Chief Complaint Chief Complaint  Patient presents with  . Sore Throat   The history is provided by the patient. No language interpreter was used.   HPI Comments: Todd Pena is a 31 y.o. male who presents to the Emergency Department complaining of constant moderate sore throat that started yesterday. He took Mucinex PTA without relief. His pain is exacerbated by eating/drinking. He reports associated headache and generalized myalgias. He denies any fever, ear pain, rhinorrhea, cough, SOB, abdominal pain, or other complaints.   Past Medical History:  Diagnosis Date  . Hypertension     There are no active problems to display for this patient.   History reviewed. No pertinent surgical history.     Home Medications    Prior to Admission medications   Medication Sig Start Date End Date Taking? Authorizing Provider  amLODipine (NORVASC) 5 MG tablet Take 1 tablet (5 mg total) by mouth daily. 01/25/16   Dione Booze, MD  amoxicillin (AMOXIL) 500 MG capsule Take 1 capsule (500 mg total) by mouth 3 (three) times daily. 04/27/15   Elson Areas, PA-C  benazepril (LOTENSIN) 10 MG tablet Take 1 tablet (10 mg total) by mouth daily. 01/25/16   Dione Booze, MD  ibuprofen (ADVIL,MOTRIN) 800 MG tablet Take 1 tablet (800 mg total) by mouth 3 (three) times daily. 08/29/13   Glynn Octave, MD  naproxen (NAPROSYN) 500 MG tablet Take 1 tablet (500 mg total) by mouth 2 (two) times daily. 03/02/15   Shon Baton, MD    Family History No family history on file.  Social History Social History  Substance Use Topics  . Smoking status: Never  Smoker  . Smokeless tobacco: Never Used  . Alcohol use No     Allergies   Patient has no known allergies.   Review of Systems Review of Systems  Constitutional: Negative for fever.  HENT: Positive for sore throat. Negative for ear pain and rhinorrhea.   Respiratory: Negative for cough and shortness of breath.   Gastrointestinal: Negative for abdominal pain.  Musculoskeletal: Positive for myalgias (Generalized).  Neurological: Positive for headaches.     Physical Exam Updated Vital Signs BP (!) 164/120 (BP Location: Left Arm) Comment: not taken BP meds since Jan.   Pulse (!) 102   Temp 99.5 F (37.5 C) (Oral)   Resp 20   Ht 5\' 11"  (1.803 m)   Wt 268 lb (121.6 kg)   SpO2 99%   BMI 37.38 kg/m   Physical Exam  Constitutional: He appears well-developed and well-nourished. No distress.  HENT:  Head: Normocephalic and atraumatic.  Right Ear: Hearing, tympanic membrane, external ear and ear canal normal.  Left Ear: Hearing, tympanic membrane, external ear and ear canal normal.  Nose: Nose normal.  Mouth/Throat: Uvula is midline. Posterior oropharyngeal erythema present. No tonsillar abscesses. Tonsillar exudate (left sided).  Eyes: Conjunctivae are normal.  Neck: Neck supple.  Cardiovascular: Normal rate and regular rhythm.  Exam reveals no gallop and no friction rub.   No murmur heard. Pulmonary/Chest: Effort normal and breath sounds normal. No respiratory distress. He has no wheezes. He has no rales. He exhibits no tenderness.  Abdominal: Soft.  Bowel sounds are normal. He exhibits no distension and no mass. There is no tenderness. There is no rebound and no guarding. No hernia.  No hepatosplenomegaly.   Musculoskeletal: Normal range of motion.  Neurological: He is alert.  Skin: Skin is warm and dry.  Psychiatric: He has a normal mood and affect.  Nursing note and vitals reviewed.    ED Treatments / Results  DIAGNOSTIC STUDIES: Oxygen Saturation is 99% on RA,  normal by my interpretation.    COORDINATION OF CARE: 5:50 PM- Pt advised of plan for treatment and pt agrees.  Labs (all labs ordered are listed, but only abnormal results are displayed) Labs Reviewed  RAPID STRE[facility]CREEN (NOT AT [facility])  CULTURE, GROUP A STREP [facility])    EKG  EKG Interpretation None       Radiology No results found.  Procedures Procedures (including critical care time)  Medications Ordered in ED Medications  cephALEXin (KEFLEX) capsule 500 mg (500 mg Oral Given 05/14/16 1802)  dexamethasone (DECADRON) 1 MG/ML solution 10 mg (10 mg Oral Given 05/14/16 1802)     Initial Impression / Assessment and Plan / ED Course  I have reviewed the triage vital signs and the nursing notes.  Pertinent labs & imaging results that were available during my care of the patient were reviewed by me and considered in my medical decision making (see chart for details).  31 year old male presents with pharyngitis. Likely strep vs mono. He is mildly tachycardic and hypertensive and has elevated temp. Rapid strep is negative. Due to exudative tonsillitis will treat with Keflex and dose of steroids in ED. Offered testing for mono but pt declined. Return precautions given.  Final Clinical Impressions(s) / ED Diagnoses   Final diagnoses:  Pharyngitis, unspecified etiology    New Prescriptions Discharge Medication List as of 05/14/2016  6:15 PM    START taking these medications   Details  cephALEXin (KEFLEX) 500 MG capsule Take 1 capsule (500 mg total) by mouth 2 (two) times daily., Starting Sat 05/14/2016, Print       I personally performed the services described in this documentation, which was scribed in my presence. The recorded information has been reviewed and is accurate.     Bethel BornKelly Marie Airen Dales, PA-C 05/15/16 1324    Tilden FossaElizabeth Rees, MD 05/15/16 [phone]

## 2016-05-17 LAB — CULTURE, GROUP A STREP (THRC)

## 2016-07-12 ENCOUNTER — Emergency Department (HOSPITAL_BASED_OUTPATIENT_CLINIC_OR_DEPARTMENT_OTHER)
Admission: EM | Admit: 2016-07-12 | Discharge: 2016-07-12 | Disposition: A | Discharge status: Home / Self Care | Payer: 59 | Attending: Emergency Medicine | Admitting: Emergency Medicine

## 2016-07-12 ENCOUNTER — Encounter (HOSPITAL_BASED_OUTPATIENT_CLINIC_OR_DEPARTMENT_OTHER): Payer: Self-pay

## 2016-07-12 DIAGNOSIS — I10 Essential (primary) hypertension: Secondary | ICD-10-CM | POA: Diagnosis not present

## 2016-07-12 DIAGNOSIS — K625 Hemorrhage of anus and rectum: Secondary | ICD-10-CM | POA: Diagnosis present

## 2016-07-12 DIAGNOSIS — K922 Gastrointestinal hemorrhage, unspecified: Secondary | ICD-10-CM | POA: Diagnosis not present

## 2016-07-12 LAB — CBC WITH DIFFERENTIAL/PLATELET
Basophils Absolute: 0.1 10*3/uL (ref 0.0–0.1)
Basophils Relative: 1 %
Eosinophils Absolute: 0.2 10*3/uL (ref 0.0–0.7)
Eosinophils Relative: 2 %
HEMATOCRIT: 44.1 % (ref 39.0–52.0)
Hemoglobin: 15.4 g/dL (ref 13.0–17.0)
LYMPHS ABS: 3.4 10*3/uL (ref 0.7–4.0)
Lymphocytes Relative: 27 %
MCH: 30.7 pg (ref 26.0–34.0)
MCHC: 34.9 g/dL (ref 30.0–36.0)
MCV: 87.8 fL (ref 78.0–100.0)
MONO ABS: 0.9 10*3/uL (ref 0.1–1.0)
MONOS PCT: 7 %
Neutro Abs: 8 10*3/uL — ABNORMAL HIGH (ref 1.7–7.7)
Neutrophils Relative %: 63 %
Platelets: 651 10*3/uL — ABNORMAL HIGH (ref 150–400)
RBC: 5.02 MIL/uL (ref 4.22–5.81)
RDW: 12.7 % (ref 11.5–15.5)
WBC: 12.5 10*3/uL — ABNORMAL HIGH (ref 4.0–10.5)

## 2016-07-12 LAB — COMPREHENSIVE METABOLIC PANEL
ALBUMIN: 4.4 g/dL (ref 3.5–5.0)
ALK PHOS: 84 U/L (ref 38–126)
ALT: 26 U/L (ref 17–63)
AST: 21 U/L (ref 15–41)
Anion gap: 9 (ref 5–15)
BUN: 11 mg/dL (ref 6–20)
CHLORIDE: 103 mmol/L (ref 101–111)
CO2: 26 mmol/L (ref 22–32)
CREATININE: 1.15 mg/dL (ref 0.61–1.24)
Calcium: 9.3 mg/dL (ref 8.9–10.3)
GFR calc Af Amer: >60 mL/min (ref >60)
GFR calc non Af Amer: >60 mL/min (ref >60)
Glucose, Bld: 91 mg/dL (ref 65–99)
Potassium: 3.8 mmol/L (ref 3.5–5.1)
SODIUM: 138 mmol/L (ref 135–145)
Total Bilirubin: 0.6 mg/dL (ref 0.3–1.2)
Total Protein: 8 g/dL (ref 6.5–8.1)

## 2016-07-12 LAB — OCCULT BLOOD X 1 CARD TO LAB, STOOL: Fecal Occult Bld: POSITIVE — AB

## 2016-07-12 NOTE — ED Provider Notes (Signed)
[facility]   CSN: [id number] Arrival date & time: 07/12/16  1936  By signing my name below, I, Thelma Bargeick Cochran, attest that this documentation has been prepared under the direction and in the presence of No att. providers found. Electronically Signed: Thelma BargeNick Cochran, Scribe. 07/12/16. 9:09 PM.  History   Chief Complaint Chief Complaint  Patient presents with  . Rectal Bleeding   The history is provided by the patient. No language interpreter was used.   HPI Comments: Todd Pena is a 31 y.o. male with a PMHx of hemorrhoids who presents to the Emergency Department complaining of acute rectal bleeding that began about 5-6 hours ago and continued for 1 hour. He states he had a BM and there was a significant amount of blood. His stool was soft. He states he has not used the bathroom since then. He denies any pain, light-headedness, dizziness, or other current bleeding. Past Medical History:  Diagnosis Date  . Hypertension     There are no active problems to display for this patient.   History reviewed. No pertinent surgical history.     Home Medications    Prior to Admission medications   Not on File    Family History No family history on file.  Social History Social History  Substance Use Topics  . Smoking status: Never Smoker  . Smokeless tobacco: Never Used  . Alcohol use No     Allergies   Patient has no known allergies.   Review of Systems Review of Systems  Constitutional: Negative for chills and fever.  HENT: Negative for sore throat.   Eyes: Negative for visual disturbance.  Respiratory: Negative for shortness of breath.   Cardiovascular: Negative for chest pain.  Gastrointestinal: Positive for anal bleeding and blood in stool. Negative for abdominal pain, diarrhea, rectal pain and vomiting.  Genitourinary: Negative for dysuria.  Musculoskeletal: Negative for arthralgias.  Skin: Negative for color change and rash.    Neurological: Negative for dizziness and light-headedness.  All other systems reviewed and are negative.    Physical Exam Updated Vital Signs BP (!) 171/123 (BP Location: Right Arm)   Pulse 75   Temp 98.9 F (37.2 C) (Oral)   Resp 18   Ht 5\' 11"  (1.803 m)   Wt 270 lb (122.5 kg)   SpO2 98%   BMI 37.66 kg/m   Physical Exam  Pulmonary/Chest: Effort normal and breath sounds normal.  Abdominal: Soft. Bowel sounds are normal. There is no tenderness.  Genitourinary:  Genitourinary Comments: Mild external hemorrhoids No gross blood No masses     ED Treatments / Results  DIAGNOSTIC STUDIES: Oxygen Saturation is 99% on RA, normal by my interpretation.    COORDINATION OF CARE: 9:06 PM Discussed treatment plan with pt at bedside and pt agreed to plan.   Labs (all labs ordered are listed, but only abnormal results are displayed) Labs Reviewed  OCCULT BLOOD X 1 CARD TO LAB, STOOL - Abnormal; Notable for the following:       Result Value   Fecal Occult Bld POSITIVE (*)    All other components within normal limits  CBC WITH DIFFERENTIAL/PLATELET - Abnormal; Notable for the following:    WBC 12.5 (*)    Platelets 651 (*)    Neutro Abs 8.0 (*)    All other components within normal limits  COMPREHENSIVE METABOLIC PANEL    EKG  EKG Interpretation None       Radiology No results found.  Procedures  Procedures (including critical care time)  Medications Ordered in ED Medications - No data to display   Initial Impression / Assessment and Plan / ED Course  I have reviewed the triage vital signs and the nursing notes.  Pertinent labs & imaging results that were available during my care of the patient were reviewed by me and considered in my medical decision making (see chart for details).     Patient with rectal bleeding. Did have another episode of it here. Lab workup is reassuring. Vitals reassuring and only high blood pressure. On rectal exam did not show clear  source of bleeding. No bleeding hemorrhoid. No gross blood but was guaiac positive. Benign exam. Will discharge to have follow-up with gastroenterology. Gave follow-up instructions and he may end up returning if the bleeding doesn't slow down.  Final Clinical Impressions(s) / ED Diagnoses   Final diagnoses:  Lower GI bleed    New Prescriptions There are no discharge medications for this patient. I personally performed the services described in this documentation, which was scribed in my presence. The recorded information has been reviewed and is accurate.       Benjiman Core, MD 07/12/16 2330

## 2016-07-12 NOTE — ED Notes (Signed)
ED Provider at bedside. 

## 2016-07-12 NOTE — ED Triage Notes (Signed)
C/o dark red blood in toilet after BM x 1 today-hx of hemorrhoid-NAD-steady gait

## 2016-08-29 ENCOUNTER — Emergency Department (HOSPITAL_BASED_OUTPATIENT_CLINIC_OR_DEPARTMENT_OTHER)
Admission: EM | Admit: 2016-08-29 | Discharge: 2016-08-29 | Disposition: A | Discharge status: Home / Self Care | Payer: 59 | Attending: Emergency Medicine | Admitting: Emergency Medicine

## 2016-08-29 ENCOUNTER — Encounter (HOSPITAL_BASED_OUTPATIENT_CLINIC_OR_DEPARTMENT_OTHER): Payer: Self-pay | Admitting: Emergency Medicine

## 2016-08-29 DIAGNOSIS — K029 Dental caries, unspecified: Secondary | ICD-10-CM | POA: Insufficient documentation

## 2016-08-29 DIAGNOSIS — I10 Essential (primary) hypertension: Secondary | ICD-10-CM | POA: Insufficient documentation

## 2016-08-29 MED ORDER — AMLODIPINE BESYLATE 5 MG PO TABS: 5.0000 mg | ORAL_TABLET | Freq: Every day | ORAL | 0 refills | Status: DC

## 2016-08-29 MED ORDER — PENICILLIN V POTASSIUM 250 MG PO TABS
500.0000 mg | ORAL_TABLET | Freq: Once | ORAL | Status: AC
  Administered 2016-08-29: 500 mg via ORAL
  Filled 2016-08-29: qty 2

## 2016-08-29 MED ORDER — NAPROXEN 375 MG PO TABS: 375.0000 mg | ORAL_TABLET | Freq: Two times a day (BID) | ORAL | 0 refills | Status: DC

## 2016-08-29 MED ORDER — PENICILLIN V POTASSIUM 500 MG PO TABS: 500.0000 mg | ORAL_TABLET | Freq: Four times a day (QID) | ORAL | 0 refills | Status: AC

## 2016-08-29 MED ORDER — NAPROXEN 250 MG PO TABS
500.0000 mg | ORAL_TABLET | Freq: Once | ORAL | Status: AC
  Administered 2016-08-29: 500 mg via ORAL
  Filled 2016-08-29: qty 2

## 2016-08-29 MED ORDER — LIDOCAINE VISCOUS 2 % MT SOLN
15.0000 mL | Freq: Once | OROMUCOSAL | Status: AC
  Administered 2016-08-29: 15 mL via OROMUCOSAL
  Filled 2016-08-29: qty 15

## 2016-08-29 NOTE — ED Notes (Signed)
Here for toothache, (denies: fever, nv, dizziness, drainage), onset ~1 hr ago, pinpoints to R lower pre-molar, h/o similar, no meds PTA, swishing with listerine, no PCP or dentist.   Alert, NAD, calm, interactive, resps e/u, speaking in clear complete sentences, no dyspnea noted, skin W&D, VSS, BP elevated. H/o HTN, out of BP med, takes amlodipine/ benazapril (not sure of dose). EDP Dr. Nicanor AlconPalumbo into room during triage.

## 2016-08-29 NOTE — ED Provider Notes (Addendum)
[facility]   CSN: [id number] Arrival date & time: 08/29/16  4098     History   Chief Complaint Chief Complaint  Patient presents with  . Dental Pain    HPI Briton JERAMEY LANUZA is a 31 y.o. male.  The history is provided by the patient.  Dental Pain   The current episode started 1 to 2 hours ago. The problem occurs constantly. The problem has not changed since onset.The pain is at a severity of 10/10. The pain is severe. He has tried nothing for the symptoms. The treatment provided no relief.    Past Medical History:  Diagnosis Date  . Hypertension     There are no active problems to display for this patient.   History reviewed. No pertinent surgical history.     Home Medications    Prior to Admission medications   Medication Sig Start Date End Date Taking? Authorizing Provider  amLODipine (NORVASC) 5 MG tablet Take 1 tablet (5 mg total) by mouth daily. 08/29/16   Antionetta Ator, MD  naproxen (NAPROSYN) 375 MG tablet Take 1 tablet (375 mg total) by mouth 2 (two) times daily. 08/29/16   Glenette Bookwalter, MD  penicillin v potassium (VEETID) 500 MG tablet Take 1 tablet (500 mg total) by mouth 4 (four) times daily. 08/29/16 09/05/16  Jaleesa Cervi, MD    Family History History reviewed. No pertinent family history.  Social History Social History  Substance Use Topics  . Smoking status: Never Smoker  . Smokeless tobacco: Never Used  . Alcohol use No     Allergies   Patient has no known allergies.   Review of Systems Review of Systems  Constitutional: Negative for fever.  HENT: Positive for dental problem. Negative for congestion, drooling and facial swelling.   All other systems reviewed and are negative.    Physical Exam Updated Vital Signs BP (!) 179/130 (BP Location: Left Arm)   Pulse 98   Temp 98.3 F (36.8 C) (Oral)   Resp 16   Ht 5\' 11"  (1.803 m)   Wt 127 kg (280 lb)   SpO2 98%   BMI 39.05 kg/m   Physical Exam    Constitutional: He is oriented to person, place, and time. He appears well-developed and well-nourished. No distress.  HENT:  Head: Normocephalic and atraumatic.  Nose: Nose normal.  Mouth/Throat: No trismus in the jaw. Abnormal dentition. Dental caries present. No uvula swelling. No oropharyngeal exudate.    Eyes: Conjunctivae are normal. Pupils are equal, round, and reactive to light.  Neck: Normal range of motion. Neck supple.  Cardiovascular: Normal rate, regular rhythm, normal heart sounds and intact distal pulses.   Pulmonary/Chest: Effort normal and breath sounds normal. He has no wheezes. He has no rales.  Abdominal: Soft. Bowel sounds are normal. He exhibits no mass. There is no tenderness. There is no rebound and no guarding.  Musculoskeletal: Normal range of motion.  Lymphadenopathy:    He has no cervical adenopathy.  Neurological: He is alert and oriented to person, place, and time.  Skin: Skin is warm and dry. Capillary refill takes less than 2 seconds.  Psychiatric: He has a normal mood and affect.     ED Treatments / Results   Vitals:   08/29/16 0342  BP: (!) 179/130  Pulse: 98  Resp: 16  Temp: 98.3 F (36.8 C)    Radiology No results found.  Procedures Procedures (including critical care time)  Medications Ordered in ED Medications  penicillin v potassium (VEETID) tablet 500 mg (500 mg Oral Given 08/29/16 0359)  naproxen (NAPROSYN) tablet 500 mg (500 mg Oral Given 08/29/16 0359)  lidocaine (XYLOCAINE) 2 % viscous mouth solution 15 mL (15 mLs Mouth/Throat Given 08/29/16 0359)       Final Clinical Impressions(s) / ED Diagnoses   Final diagnoses:  Dental caries   Follow up with a dentist for extraction.  Strict return precautions given. Return immediately for fever >101, altered level of consciousness,bleeding or any concerns. Follow up with your own doctor for ongoing concerns.   The patient is nontoxic-appearing on exam and vital signs are  within normal limits.   I have reviewed the triage vital signs and the nursing notes. Pertinent labs &imaging results that were available during my care of the patient were reviewed by me and considered in my medical decision making (see chart for details).  After history, exam, and medical workup I feel the patient has been appropriately medically screened and is safe for discharge home. Pertinent diagnoses were discussed with the patient. Patient was given return precautions.    New Prescriptions New Prescriptions   AMLODIPINE (NORVASC) 5 MG TABLET    Take 1 tablet (5 mg total) by mouth daily.   NAPROXEN (NAPROSYN) 375 MG TABLET    Take 1 tablet (375 mg total) by mouth 2 (two) times daily.   PENICILLIN V POTASSIUM (VEETID) 500 MG TABLET    Take 1 tablet (500 mg total) by mouth 4 (four) times daily.     Kerwin Augustus, MD 08/29/16 [id number]    Cy BlamerPalumbo, Hong Timm, MD 08/29/16 [id number]

## 2016-10-06 ENCOUNTER — Emergency Department (HOSPITAL_BASED_OUTPATIENT_CLINIC_OR_DEPARTMENT_OTHER)
Admission: EM | Admit: 2016-10-06 | Discharge: 2016-10-06 | Disposition: A | Discharge status: Home / Self Care | Payer: 59 | Attending: Emergency Medicine | Admitting: Emergency Medicine

## 2016-10-06 ENCOUNTER — Encounter (HOSPITAL_BASED_OUTPATIENT_CLINIC_OR_DEPARTMENT_OTHER): Payer: Self-pay | Admitting: *Deleted

## 2016-10-06 DIAGNOSIS — I10 Essential (primary) hypertension: Secondary | ICD-10-CM | POA: Insufficient documentation

## 2016-10-06 DIAGNOSIS — J029 Acute pharyngitis, unspecified: Secondary | ICD-10-CM | POA: Insufficient documentation

## 2016-10-06 LAB — RAPID STREP SCREEN (MED CTR MEBANE ONLY): STREPTOCOCCUS, GROUP A SCREEN (DIRECT): NEGATIVE

## 2016-10-06 NOTE — ED Notes (Signed)
ED Provider at bedside. 

## 2016-10-06 NOTE — ED Triage Notes (Signed)
Sore throat x 3 days. His wife has strep throat.

## 2016-10-06 NOTE — ED Provider Notes (Signed)
[facility]   CSN: [id number] Arrival date & time: 10/06/16  1226     History   Chief Complaint Chief Complaint  Patient presents with  . Sore Throat    HPI Todd Pena is a 31 y.o. male.  HPI  31 year old male presents with concern for strep throat. He's been having a sore throat for 3 days. This morning he and his wife noticed "pus pockets" in the back of his throat and he became concerned. He has not had fevers. He has had cough and some mild neck pain. No neck stiffness. Painful to swallow but he is able to swallow. No drooling. No shortness of breath or vomiting. He states his wife was diagnosed with strep at [facility] and he thinks he has strep.  Past Medical History:  Diagnosis Date  . Hypertension     There are no active problems to display for this patient.   History reviewed. No pertinent surgical history.     Home Medications    Prior to Admission medications   Medication Sig Start Date End Date Taking? Authorizing Provider  amLODipine (NORVASC) 5 MG tablet Take 1 tablet (5 mg total) by mouth daily. 08/29/16   Palumbo, April, MD  naproxen (NAPROSYN) 375 MG tablet Take 1 tablet (375 mg total) by mouth 2 (two) times daily. 08/29/16   Palumbo, April, MD    Family History No family history on file.  Social History Social History  Substance Use Topics  . Smoking status: Never Smoker  . Smokeless tobacco: Never Used  . Alcohol use No     Allergies   Patient has no known allergies.   Review of Systems Review of Systems  Constitutional: Negative for fever.  HENT: Positive for sore throat. Negative for ear pain and rhinorrhea.   Respiratory: Positive for cough. Negative for shortness of breath.   Gastrointestinal: Negative for vomiting.  All other systems reviewed and are negative.    Physical Exam Updated Vital Signs BP (!) 168/116   Pulse 88   Temp 98.7 F (37.1 C) (Oral)   Resp 20   Ht 5\' 11"  (1.803 m)   Wt 127  kg (280 lb)   SpO2 98%   BMI 39.05 kg/m   Physical Exam  Constitutional: He appears well-developed and well-nourished.  HENT:  Head: Normocephalic and atraumatic.  Right Ear: External ear normal.  Left Ear: External ear normal.  Nose: Nose normal.  Mouth/Throat: Uvula is midline and mucous membranes are normal. No uvula swelling. Oropharyngeal exudate present. No tonsillar abscesses. Tonsils are 2+ on the right. Tonsils are 2+ on the left.  Eyes: Right eye exhibits no discharge. Left eye exhibits no discharge.  Neck: Normal range of motion. Neck supple. No muscular tenderness present. No neck rigidity. Normal range of motion present.  Cardiovascular: Normal rate, regular rhythm and normal heart sounds.   Pulmonary/Chest: Effort normal and breath sounds normal. He has no wheezes. He has no rales.  Musculoskeletal: He exhibits no edema.  Lymphadenopathy:    He has cervical adenopathy (mild).  Neurological: He is alert.  Skin: Skin is warm and dry.  Nursing note and vitals reviewed.    ED Treatments / Results  Labs (all labs ordered are listed, but only abnormal results are displayed) Labs Reviewed  RAPID STREP SCREEN (NOT AT [facility])  CULTURE, GROUP A STREP [facility]    EKG  EKG Interpretation None       Radiology No results found.  Procedures  Procedures (including critical care time)  Medications Ordered in ED Medications - No data to display   Initial Impression / Assessment and Plan / ED Course  I have reviewed the triage vital signs and the nursing notes.  Pertinent labs & imaging results that were available during my care of the patient were reviewed by me and considered in my medical decision making (see chart for details).     Patient's presentation is consistent with a viral pharyngitis/URI. He is overall well-appearing. No signs of deep space neck infection. Given negative strep test, despite his wife recently being treated with antibiotics, I think  bacterial or strep pharyngitis is less likely. Discussed symptomatic care and how antibiotics do not take care of virus is. Discussed return precautions.  Final Clinical Impressions(s) / ED Diagnoses   Final diagnoses:  Viral pharyngitis    New Prescriptions New Prescriptions   No medications on file     Pricilla Loveless, MD 10/06/16 1315

## 2016-10-09 LAB — CULTURE, GROUP A STREP (THRC)

## 2017-06-01 ENCOUNTER — Other Ambulatory Visit: Payer: Self-pay

## 2017-06-01 ENCOUNTER — Emergency Department (HOSPITAL_BASED_OUTPATIENT_CLINIC_OR_DEPARTMENT_OTHER)
Admission: EM | Admit: 2017-06-01 | Discharge: 2017-06-01 | Disposition: A | Discharge status: Home / Self Care | Payer: 59 | Attending: Physician Assistant | Admitting: Physician Assistant

## 2017-06-01 ENCOUNTER — Encounter (HOSPITAL_BASED_OUTPATIENT_CLINIC_OR_DEPARTMENT_OTHER): Payer: Self-pay | Admitting: *Deleted

## 2017-06-01 DIAGNOSIS — Z79899 Other long term (current) drug therapy: Secondary | ICD-10-CM | POA: Insufficient documentation

## 2017-06-01 DIAGNOSIS — H00015 Hordeolum externum left lower eyelid: Secondary | ICD-10-CM | POA: Insufficient documentation

## 2017-06-01 DIAGNOSIS — I10 Essential (primary) hypertension: Secondary | ICD-10-CM | POA: Insufficient documentation

## 2017-06-01 MED ORDER — ERYTHROMYCIN 5 MG/GM OP OINT: 1.0000 "application " | TOPICAL_OINTMENT | Freq: Four times a day (QID) | OPHTHALMIC | 0 refills | Status: DC

## 2017-06-01 MED FILL — ERYTHROMYCIN EYE OINTMENT: 5 | 7 days supply | Qty: 4 | Fill #0

## 2017-06-01 NOTE — ED Provider Notes (Signed)
[facility] EMERGENCY DEPARTMENT Provider Note   CSN: [id number] Arrival date & time: 06/01/17  1227     History   Chief Complaint Chief Complaint  Patient presents with  . Eye Problem    HPI Todd Pena is a 32 y.o. male with a PMHx of HTN, who presents to the ED with complaints of left lower eyelid swelling, redness, and intermittent pain for the last 3 days.  Patient states that he has had styes before but not in this location, and they usually go away on their own after a few days.  This one seems to have persisted, which is why he came in for evaluation.  He describes the pain as 5/10 intermittent only with palpation, achy and sore nonradiating left lower eyelid pain, worse with palpation to the area, and mildly improved with warm compresses and ibuprofen.  He denies any injury or trauma to the eye, recent swimming, or sick contacts.  He does not wear contact lenses.  He also does not use any eye makeup.  He denies any warmth to the area, drainage, vision changes, eye pain, eye redness, photophobia, fevers, chills, or any other complaints at this time.  He has no known allergies.  The history is provided by the patient and medical records. No language interpreter was used.  Eye Problem   This is a new problem. The current episode started more than 2 days ago. The problem occurs constantly. The problem has not changed since onset.There is a problem in the left eye. There was no injury mechanism. The pain is at a severity of 5/10. The pain is mild. There is no history of trauma to the eye. There is no known exposure to pink eye. He does not wear contacts. Pertinent negatives include no blurred vision, no decreased vision, no discharge, no photophobia and no eye redness. Treatments tried: warm compress and ibuprofen. The treatment provided mild relief.    Past Medical History:  Diagnosis Date  . Hypertension     There are no active problems to display for this  patient.   History reviewed. No pertinent surgical history.      Home Medications    Prior to Admission medications   Medication Sig Start Date End Date Taking? Authorizing Provider  amLODipine (NORVASC) 5 MG tablet Take 1 tablet (5 mg total) by mouth daily. 08/29/16   Palumbo, April, MD  naproxen (NAPROSYN) 375 MG tablet Take 1 tablet (375 mg total) by mouth 2 (two) times daily. 08/29/16   Palumbo, April, MD    Family History No family history on file.  Social History Social History   Tobacco Use  . Smoking status: Never Smoker  . Smokeless tobacco: Never Used  Substance Use Topics  . Alcohol use: No  . Drug use: No     Allergies   Patient has no known allergies.   Review of Systems Review of Systems  Constitutional: Negative for chills and fever.  Eyes: Negative for blurred vision, photophobia, pain, discharge, redness and visual disturbance.       +L lower eyelid swelling, redness, and tenderness No eyelid warmth or eye/eyelid drainage  Allergic/Immunologic: Negative for immunocompromised state.   All other systems reviewed and are negative for acute change except as noted in the HPI.    Physical Exam Updated Vital Signs BP (!) 163/118 Comment: stopped taking his BP med 3 months ago  Pulse 73   Temp 99.1 F (37.3 C) (Oral)   Resp 18  Ht 5\' 11"  (1.803 m)   Wt 127 kg (280 lb)   SpO2 96%   BMI 39.05 kg/m   Physical Exam  Constitutional: He is oriented to person, place, and time. Vital signs are normal. He appears well-developed and well-nourished.  Non-toxic appearance. No distress.  Afebrile, nontoxic, NAD, HTN noted but similar to prior visits  HENT:  Head: Normocephalic and atraumatic.  Mouth/Throat: Mucous membranes are normal.  Eyes: Pupils are equal, round, and reactive to light. Conjunctivae and EOM are normal. Lids are everted and swept, no foreign bodies found. Right eye exhibits no discharge. Left eye exhibits hordeolum. Left eye exhibits no  discharge.  PERRL, EOMI, no nystagmus, conjunctiva clear bilaterally. Lids everted and swept with no FBs noted. No ocular drainage. No photophobia or pain with eye movements. Small ~66mm spherical nodule to L lower eyelid consistent with hordeolum, mildly erythematous but without warmth, mildly TTP, no evidence of surrounding cellulitis.   Neck: Normal range of motion. Neck supple.  Cardiovascular: Normal rate and intact distal pulses.  Pulmonary/Chest: Effort normal. No respiratory distress.  Abdominal: Normal appearance. He exhibits no distension.  Musculoskeletal: Normal range of motion.  Neurological: He is alert and oriented to person, place, and time. He has normal strength. No sensory deficit.  Skin: Skin is warm, dry and intact. No rash noted.  Psychiatric: He has a normal mood and affect.  Nursing note and vitals reviewed.    ED Treatments / Results  Labs (all labs ordered are listed, but only abnormal results are displayed) Labs Reviewed - No data to display  EKG None  Radiology No results found.  Procedures Procedures (including critical care time)  Medications Ordered in ED Medications - No data to display   Initial Impression / Assessment and Plan / ED Course  I have reviewed the triage vital signs and the nursing notes.  Pertinent labs & imaging results that were available during my care of the patient were reviewed by me and considered in my medical decision making (see chart for details).     32 y.o. male here with swelling to left lower eyelid x3 days, has had styes before but usually in a different spot, and they usually self resolve. On exam today, small ~33mm spherical nodule to left lower eyelid consistent with a hordeolum, mildly erythematous but no warmth, no surrounding evidence of cellulitis, mildly TTP. Conjunctiva clear, EOMI, PERRL. Likely hordeolum. Will treat with erythromycin ointment to cover for infection, but doubt need for PO abx. No evidence of  periorbital or orbital cellulitis. Advised tylenol/motrin for pain, warm/cool compresses, washing eyelids with baby shampoo, and f/up with PCP in 3-5 days for recheck. I explained the diagnosis and have given explicit precautions to return to the ER including for any other new or worsening symptoms. The patient understands and accepts the medical plan as it's been dictated and I have answered their questions. Discharge instructions concerning home care and prescriptions have been given. The patient is STABLE and is discharged to home in good condition.    Final Clinical Impressions(s) / ED Diagnoses   Final diagnoses:  Hordeolum externum of left lower eyelid    ED Discharge Orders        Ordered    erythromycin ophthalmic ointment  4 times daily     06/01/17 [address] [place], PA-C 06/01/17 1302    [place], Cindee Salt, MD 06/02/17 1620

## 2017-06-01 NOTE — Discharge Instructions (Addendum)
You have a hordeolum. Try to avoid rubbing eyes. Apply warm or cool compresses and use prescribed eye ointments as directed. Use baby shampoo to wash the eyelids/eyelashes twice daily. Alternate between tylenol and motrin as needed for pain. Follow up with your regular doctor in 3-5 days for recheck of symptoms. Return to the ER for changes or worsening symptoms.

## 2017-06-01 NOTE — ED Triage Notes (Signed)
Swelling to his left lower eye lid x 3 days.

## 2017-11-27 ENCOUNTER — Encounter (HOSPITAL_BASED_OUTPATIENT_CLINIC_OR_DEPARTMENT_OTHER): Payer: Self-pay

## 2017-11-27 ENCOUNTER — Emergency Department (HOSPITAL_BASED_OUTPATIENT_CLINIC_OR_DEPARTMENT_OTHER)
Admission: EM | Admit: 2017-11-27 | Discharge: 2017-11-27 | Disposition: A | Discharge status: Home / Self Care | Payer: Self-pay | Attending: Emergency Medicine | Admitting: Emergency Medicine

## 2017-11-27 DIAGNOSIS — L03213 Periorbital cellulitis: Secondary | ICD-10-CM | POA: Insufficient documentation

## 2017-11-27 DIAGNOSIS — I10 Essential (primary) hypertension: Secondary | ICD-10-CM | POA: Insufficient documentation

## 2017-11-27 MED ORDER — SULFAMETHOXAZOLE-TRIMETHOPRIM 800-160 MG PO TABS: 1.0000 | ORAL_TABLET | Freq: Two times a day (BID) | ORAL | 0 refills | Status: AC

## 2017-11-27 MED ORDER — AMOXICILLIN-POT CLAVULANATE 875-125 MG PO TABS: 1.0000 | ORAL_TABLET | Freq: Two times a day (BID) | ORAL | 0 refills | Status: AC

## 2017-11-27 MED ORDER — AMLODIPINE BESYLATE 5 MG PO TABS: 5.0000 mg | ORAL_TABLET | Freq: Every day | ORAL | 2 refills | Status: DC

## 2017-11-27 MED FILL — AMLODIPINE BESYLATE 5 MG TA: 5 | 30 days supply | Qty: 30 | Fill #0

## 2017-11-27 MED FILL — SULFAMETHOXAZOLE-TMP DS TAB: 800-160 | 7 days supply | Qty: 14 | Fill #0

## 2017-11-27 MED FILL — AMOX-CLAV 875-125 MG TABLET: 875-125 | 7 days supply | Qty: 14 | Fill #0

## 2017-11-27 NOTE — ED Provider Notes (Signed)
[facility] Emergency Department Provider Note MRN:  [id number]  Arrival date & time: 11/27/17     Chief Complaint   Facial Swelling (abscess to lt eye)   History of Present Illness   Todd Pena is a 32 y.o. year-old male with a history of hypertension presenting to the ED with chief complaint of facial swelling.  Patient explains that to 3 days ago, he noticed a boil next to his left eye.  He was able to bring it to ahead and push on it, expressing pus.  Yesterday, he woke up in his eye was swollen shut.  Swelling to the upper and lower eyelid on the left side.  No significant pain, no vision loss or change, no fever.  Review of Systems  A complete 10 system review of systems was obtained and all systems are negative except as noted in the HPI and PMH.   Patient's Health History    Past Medical History:  Diagnosis Date  . Hypertension     History reviewed. No pertinent surgical history.  No family history on file.  Social History   Socioeconomic History  . Marital status: Married    Spouse name: Not on file  . Number of children: Not on file  . Years of education: Not on file  . Highest education level: Not on file  Occupational History  . Not on file  Social Needs  . Financial resource strain: Not on file  . Food insecurity:    Worry: Not on file    Inability: Not on file  . Transportation needs:    Medical: Not on file    Non-medical: Not on file  Tobacco Use  . Smoking status: Never Smoker  . Smokeless tobacco: Never Used  Substance and Sexual Activity  . Alcohol use: No  . Drug use: No  . Sexual activity: Not on file  Lifestyle  . Physical activity:    Days per week: Not on file    Minutes per session: Not on file  . Stress: Not on file  Relationships  . Social connections:    Talks on phone: Not on file    Gets together: Not on file    Attends religious service: Not on file    Active member of club or organization:  Not on file    Attends meetings of clubs or organizations: Not on file    Relationship status: Not on file  . Intimate partner violence:    Fear of current or ex partner: Not on file    Emotionally abused: Not on file    Physically abused: Not on file    Forced sexual activity: Not on file  Other Topics Concern  . Not on file  Social History Narrative  . Not on file     Physical Exam  Vital Signs and Nursing Notes reviewed Vitals:   11/27/17 1010 11/27/17 1215  BP: (!) 158/117   Pulse: 82   Resp: 14   Temp: 98.1 F (36.7 C)   SpO2: (!) 82% 100%    CONSTITUTIONAL: Well-appearing, NAD NEURO:  Alert and oriented x 3, no focal deficits EYES:  eyes equal and reactive, normal extraocular movements, healing wound just medial to the left eye, swelling to the left upper lid ENT/NECK:  no LAD, no JVD CARDIO: Regular rate, well-perfused, normal S1 and S2 PULM:  CTAB no wheezing or rhonchi GI/GU:  normal bowel sounds, non-distended, non-tender MSK/SPINE:  No gross deformities, no edema  SKIN:  no rash, atraumatic PSYCH:  Appropriate speech and behavior  Diagnostic and Interventional Summary    EKG Interpretation  Date/Time:    Ventricular Rate:    PR Interval:    QRS Duration:   QT Interval:    QTC Calculation:   R Axis:     Text Interpretation:        Labs Reviewed - No data to display  No orders to display    Medications - No data to display   Procedures Critical Care  ED Course and Medical Decision Making  I have reviewed the triage vital signs and the nursing notes.  Pertinent labs & imaging results that were available during my care of the patient were reviewed by me and considered in my medical decision making (see below for details).    Concern for periorbital cellulitis and this 32 year old male with periorbital swelling after recent pimple medial to the eye.  Pimple is well-healed, largely nontender, nonfluctuant.  No pain with extraocular movements, no  vision loss, nothing to suggest orbital cellulitis.  Will provide antibiotics, strict return precautions.  Elmer Sow. Pilar Plate, MD [facility] Emergency Medicine [facility] [email or web address]  Final Clinical Impressions(s) / ED Diagnoses     ICD-10-CM   1. Preseptal cellulitis of left eye L03.213     ED Discharge Orders         Ordered    sulfamethoxazole-trimethoprim (BACTRIM DS,SEPTRA DS) 800-160 MG tablet  2 times daily     11/27/17 1226    amoxicillin-clavulanate (AUGMENTIN) 875-125 MG tablet  Every 12 hours     11/27/17 1226    amLODipine (NORVASC) 5 MG tablet  Daily     11/27/17 1226             Sabas Sous, MD 11/27/17 1227

## 2017-11-27 NOTE — Discharge Instructions (Signed)
You were evaluated in the Emergency Department and after careful evaluation, we did not find any emergent condition requiring admission or further testing in the hospital.  Your symptoms today seem to be due to periorbital cellulitis.  It is important that you take the antibiotics provided as directed.  Any vision loss or pain with moving of the eye would be reason to return the emergency department.  We have also refilled your blood pressure medication, but we have encouraged her to follow-up with a primary care provider.  Please return to the Emergency Department if you experience any worsening of your condition.  We encourage you to follow up with a primary care provider.  Thank you for allowing [place] to be a part of your care.

## 2017-11-27 NOTE — ED Triage Notes (Signed)
Pt c/o swelling to lt eye and abscess to lt side of nose

## 2018-03-06 ENCOUNTER — Emergency Department (HOSPITAL_BASED_OUTPATIENT_CLINIC_OR_DEPARTMENT_OTHER): Payer: Self-pay

## 2018-03-06 ENCOUNTER — Encounter (HOSPITAL_BASED_OUTPATIENT_CLINIC_OR_DEPARTMENT_OTHER): Payer: Self-pay | Admitting: Emergency Medicine

## 2018-03-06 ENCOUNTER — Emergency Department (HOSPITAL_BASED_OUTPATIENT_CLINIC_OR_DEPARTMENT_OTHER)
Admission: EM | Admit: 2018-03-06 | Discharge: 2018-03-06 | Disposition: A | Discharge status: Home / Self Care | Payer: Self-pay | Attending: Emergency Medicine | Admitting: Emergency Medicine

## 2018-03-06 ENCOUNTER — Other Ambulatory Visit: Payer: Self-pay

## 2018-03-06 DIAGNOSIS — I10 Essential (primary) hypertension: Secondary | ICD-10-CM | POA: Insufficient documentation

## 2018-03-06 DIAGNOSIS — R002 Palpitations: Secondary | ICD-10-CM | POA: Insufficient documentation

## 2018-03-06 DIAGNOSIS — F17210 Nicotine dependence, cigarettes, uncomplicated: Secondary | ICD-10-CM | POA: Insufficient documentation

## 2018-03-06 DIAGNOSIS — Z79899 Other long term (current) drug therapy: Secondary | ICD-10-CM | POA: Insufficient documentation

## 2018-03-06 LAB — BASIC METABOLIC PANEL
Anion gap: 7 (ref 5–15)
BUN: 7 mg/dL (ref 6–20)
CHLORIDE: 105 mmol/L (ref 98–111)
CO2: 25 mmol/L (ref 22–32)
CREATININE: 0.97 mg/dL (ref 0.61–1.24)
Calcium: 9 mg/dL (ref 8.9–10.3)
Glucose, Bld: 92 mg/dL (ref 70–99)
Potassium: 3.8 mmol/L (ref 3.5–5.1)
SODIUM: 137 mmol/L (ref 135–145)

## 2018-03-06 LAB — CBC
HCT: 44.7 % (ref 39.0–52.0)
HEMOGLOBIN: 14.4 g/dL (ref 13.0–17.0)
MCH: 29.2 pg (ref 26.0–34.0)
MCHC: 32.2 g/dL (ref 30.0–36.0)
MCV: 90.7 fL (ref 80.0–100.0)
Platelets: 604 10*3/uL — ABNORMAL HIGH (ref 150–400)
RBC: 4.93 MIL/uL (ref 4.22–5.81)
RDW: 12.6 % (ref 11.5–15.5)
WBC: 9.1 10*3/uL (ref 4.0–10.5)
nRBC: 0 % (ref 0.0–0.2)

## 2018-03-06 LAB — MAGNESIUM: MAGNESIUM: 2.2 mg/dL (ref 1.7–2.4)

## 2018-03-06 LAB — TROPONIN I

## 2018-03-06 LAB — TSH: TSH: 1.239 u[IU]/mL (ref 0.350–4.500)

## 2018-03-06 NOTE — ED Provider Notes (Signed)
[facility] EMERGENCY DEPARTMENT Provider Note   CSN: [id number] Arrival date & time: 03/06/18  1036     History   Chief Complaint Chief Complaint  Patient presents with  . Palpitations    HPI Todd Pena is a 33 y.o. male.  HPI Patient is a 33 year old male with a history of hypertension currently not on any medications who presents the emergency department with 3 episodes of acute palpitations since last night.  He states the initial episode occurred while he was sitting and talking on the phone and he had sudden sensation of his heart racing.  He became lightheaded.  Some mild shortness of breath.  No diaphoresis.  No syncope.  This terminated on its own.  He then had 2 more events in the middle the night.  Each terminated on its own and thus he did not seek care in the emergency department.  He had some paresthesias of his left upper extremity with the second episode.  Denies weakness of his arms or legs.  No fevers or chills.  He does admit to 1-2 daily Red Bull energy drinks.  He admits to increased alcohol intake over the past month as well as tobacco abuse which is something new for him.  He is currently undergoing marital issues and his wife left him within the past several weeks.  He is currently caring for his 3 children ages 23, 83,  and 2.    Past Medical History:  Diagnosis Date  . Hypertension     There are no active problems to display for this patient.   Past Surgical History:  Procedure Laterality Date  . FRACTURE SURGERY          Home Medications    Prior to Admission medications   Medication Sig Start Date End Date Taking? Authorizing Provider  amLODipine (NORVASC) 5 MG tablet Take 1 tablet (5 mg total) by mouth daily. 08/29/16   Palumbo, April, MD  amLODipine (NORVASC) 5 MG tablet Take 1 tablet (5 mg total) by mouth daily. 11/27/17   Sabas Sous, MD  erythromycin ophthalmic ointment Place 1 application into the left eye 4 (four) times  daily. Apply thin 1/2 inch ribbon to lower eyelid every 6 hours x 7 days 06/01/17   Street, [place], PA-C  naproxen (NAPROSYN) 375 MG tablet Take 1 tablet (375 mg total) by mouth 2 (two) times daily. 08/29/16   Palumbo, April, MD    Family History History reviewed. No pertinent family history.  Social History Social History   Tobacco Use  . Smoking status: Current Some Day Smoker    Types: Cigarettes  . Smokeless tobacco: Never Used  Substance Use Topics  . Alcohol use: No  . Drug use: No     Allergies   Patient has no known allergies.   Review of Systems Review of Systems  All other systems reviewed and are negative.    Physical Exam Updated Vital Signs BP (!) 159/121   Pulse (!) 57   Resp 20   Ht 5\' 11"  (1.803 m)   Wt 108.9 kg   SpO2 99%   BMI 33.47 kg/m   Physical Exam Vitals signs and nursing note reviewed.  Constitutional:      Appearance: He is well-developed.  HENT:     Head: Normocephalic and atraumatic.  Eyes:     Pupils: Pupils are equal, round, and reactive to light.  Neck:     Musculoskeletal: Normal range of motion.  Cardiovascular:  Rate and Rhythm: Normal rate and regular rhythm.     Heart sounds: Normal heart sounds.  Pulmonary:     Effort: Pulmonary effort is normal. No respiratory distress.     Breath sounds: Normal breath sounds.  Abdominal:     General: There is no distension.     Palpations: Abdomen is soft.     Tenderness: There is no abdominal tenderness.  Musculoskeletal: Normal range of motion.  Skin:    General: Skin is warm and dry.  Neurological:     Mental Status: He is alert and oriented to person, place, and time.     Comments: 5/5 strength in major muscle groups of  bilateral upper and lower extremities. Speech normal. No facial asymetry.   Psychiatric:        Judgment: Judgment normal.      ED Treatments / Results  Labs (all labs ordered are listed, but only abnormal results are displayed) Labs Reviewed  CBC  - Abnormal; Notable for the following components:      Result Value   Platelets 604 (*)    All other components within normal limits  BASIC METABOLIC PANEL  TROPONIN I  MAGNESIUM  TSH    EKG EKG Interpretation  Date/Time:  Tuesday March 06 2018 10:47:32 EST Ventricular Rate:  62 PR Interval:    QRS Duration: 90 QT Interval:  358 QTC Calculation: 364 R Axis:   64 Text Interpretation:  Sinus rhythm Borderline T wave abnormalities Borderline ST elevation, anterior leads No significant change was found Confirmed by Azalia Bilis ([id number]) on 03/06/2018 11:48:26 AM   Radiology Dg Chest 2 View  Result Date: 03/06/2018 CLINICAL DATA:  Palpitations, arm numbness began last night, shortness of breath and dizziness EXAM: CHEST - 2 VIEW COMPARISON:  Chest x-ray of 12/02/2011 FINDINGS: No active infiltrate or effusion is seen. Mediastinal and hilar contours are unremarkable. The heart is within normal limits in size. No bony abnormality is seen. IMPRESSION: No active cardiopulmonary disease. Electronically Signed   By: Dwyane Dee M.D.   On: 03/06/2018 12:03    Procedures Procedures (including critical care time)  Medications Ordered in ED Medications - No data to display   Initial Impression / Assessment and Plan / ED Course  I have reviewed the triage vital signs and the nursing notes.  Pertinent labs & imaging results that were available during my care of the patient were reviewed by me and considered in my medical decision making (see chart for details).     Acute palpitations.  Work-up in the emergency department thought significant abnormality.  Normal neurologic exam.  Recommended discontinuation of energy drinks and other stimulants.  Recommended discontinuation of increased alcohol intake as this likely leading to some cardiac irritability.  Outpatient cardiac follow-up for Holter monitor and likely echocardiogram.  His resting heart rate here is in the low 60s and therefore I do  not think is a great candidate at this time for beta-blockade.  Hopefully with dietary changes and discontinuation of the energy drinks this will resolve on its own.  Close primary care follow-up.  He understands return to the emergency department for new or worsening symptoms  Final Clinical Impressions(s) / ED Diagnoses   Final diagnoses:  Palpitations    ED Discharge Orders    None       Azalia Bilis, MD 03/06/18 1336

## 2018-03-06 NOTE — ED Triage Notes (Addendum)
Pt c/o tachycardia, dizziness with left arm numbness that started last night while at rest. Pt states symptoms resolved then returned this morning. Denies vomiting or nausea at this time, states was nauseated last night

## 2018-03-06 NOTE — ED Notes (Signed)
ED Provider at bedside. 

## 2018-03-06 NOTE — Discharge Instructions (Addendum)
Stop drinking energy drinks  Eat healthy food and drink more water  Please develop a relationship with her primary care physician  Please return to the emergency department for new or worsening symptoms

## 2018-03-06 NOTE — ED Notes (Signed)
Patient transported to X-ray 

## 2018-03-06 NOTE — ED Notes (Signed)
Pt states he has b/p medication prescribed, but is noncompliant at this time

## 2018-03-06 NOTE — ED Notes (Addendum)
Pt states that he is having personal family issues and feels like his racing heart may be related to the stress of  "personal problems". Pt then stated that in reference to question of "hurting himself or anyone else, he stated that he had "thoughts of wanting to hurt someone". When this RN asked was it his wife, he stated yes. He stated he wouldn't hurt her, but has thoughts. EDP informed.

## 2018-11-11 ENCOUNTER — Other Ambulatory Visit: Payer: Self-pay

## 2018-11-11 ENCOUNTER — Encounter (HOSPITAL_BASED_OUTPATIENT_CLINIC_OR_DEPARTMENT_OTHER): Payer: Self-pay | Admitting: *Deleted

## 2018-11-11 ENCOUNTER — Emergency Department (HOSPITAL_BASED_OUTPATIENT_CLINIC_OR_DEPARTMENT_OTHER)
Admission: EM | Admit: 2018-11-11 | Discharge: 2018-11-11 | Disposition: A | Discharge status: Home / Self Care | Payer: Self-pay | Attending: Emergency Medicine | Admitting: Emergency Medicine

## 2018-11-11 DIAGNOSIS — K0889 Other specified disorders of teeth and supporting structures: Secondary | ICD-10-CM | POA: Insufficient documentation

## 2018-11-11 MED ORDER — AMOXICILLIN 500 MG PO CAPS: 500.0000 mg | ORAL_CAPSULE | Freq: Two times a day (BID) | ORAL | 0 refills | Status: AC

## 2018-11-11 MED ORDER — IBUPROFEN 800 MG PO TABS: 800.0000 mg | ORAL_TABLET | Freq: Three times a day (TID) | ORAL | 0 refills | Status: DC | PRN

## 2018-11-11 NOTE — ED Triage Notes (Signed)
Pt reports pain upper gum for a while. Worse x 3 days. ?abscess

## 2018-11-11 NOTE — ED Provider Notes (Signed)
Emergency Department Provider Note   I have reviewed the triage vital signs and the nursing notes.   HISTORY  Chief Complaint Dental Pain   HPI Todd Pena is a 33 y.o. male presents to the ED with dental pain in the upper, front teeth. No difficulty swallowing or voice change. No fever. Patient has no regular dentist. No radiation of symptoms or modifying factors. Pain is ongoing and worsening x 3 days.   Past Medical History:  Diagnosis Date  . Hypertension     There are no active problems to display for this patient.   Past Surgical History:  Procedure Laterality Date  . FRACTURE SURGERY      Allergies Patient has no known allergies.  No family history on file.  Social History Social History   Tobacco Use  . Smoking status: Former Smoker    Types: Cigarettes  . Smokeless tobacco: Never Used  Substance Use Topics  . Alcohol use: No  . Drug use: No    Review of Systems  Constitutional: No fever/chills Eyes: No visual changes. ENT: No sore throat. Positive dental pain.   10-point ROS otherwise negative.  ____________________________________________   PHYSICAL EXAM:  VITAL SIGNS: ED Triage Vitals  Enc Vitals Group     BP 11/11/18 2041 (!) 188/122     Pulse Rate 11/11/18 2041 80     Resp 11/11/18 2041 18     Temp 11/11/18 2041 98.9 F (37.2 C)     Temp Source 11/11/18 2041 Oral     SpO2 11/11/18 2041 100 %     Weight 11/11/18 2037 260 lb (117.9 kg)     Height 11/11/18 2037 5\' 11"  (1.803 m)   Constitutional: Alert and oriented. Well appearing and in no acute distress. Eyes: Conjunctivae are normal.  Head: Atraumatic. Nose: No congestion/rhinnorhea. Mouth/Throat: Mucous membranes are moist. No visible abscess. No trismus. Normal voice. Soft submandibular compartment.  Neck: No stridor. Cardiovascular: Normal rate, regular rhythm.  Respiratory: Normal respiratory effort.  Gastrointestinal: No distention.  Musculoskeletal: No gross  deformities of extremities. Neurologic:  Normal speech and language. Skin:  Skin is warm, dry and intact. No rash noted.   ____________________________________________   PROCEDURES  Procedure(s) performed:   Procedures  None ____________________________________________   INITIAL IMPRESSION / ASSESSMENT AND PLAN / ED COURSE  Pertinent labs & imaging results that were available during my care of the patient were reviewed by me and considered in my medical decision making (see chart for details).   Patient presents to the ED with dental pain. No visible abscess or sign of deeper space infection. Abx given. Patient to f/u with dental resources given. Discussed ED return precautions.    ____________________________________________  FINAL CLINICAL IMPRESSION(S) / ED DIAGNOSES  Final diagnoses:  Pain, dental    NEW OUTPATIENT MEDICATIONS STARTED DURING THIS VISIT:  Discharge Medication List as of 11/11/2018  9:43 PM    START taking these medications   Details  amoxicillin (AMOXIL) 500 MG capsule Take 1 capsule (500 mg total) by mouth 2 (two) times daily for 7 days., Starting Sun 11/11/2018, Until Sun 11/18/2018, Print    ibuprofen (ADVIL) 800 MG tablet Take 1 tablet (800 mg total) by mouth every 8 (eight) hours as needed for moderate pain., Starting Sun 11/11/2018, Print        Note:  This document was prepared using Dragon voice recognition software and may include unintentional dictation errors.  Alona BeneJoshua Jesus Poplin, MD Emergency Medicine    Christiano Blandon, Ivin BootyJoshua  G, MD 11/13/18 1727

## 2018-11-11 NOTE — Discharge Instructions (Signed)
You were seen in the emergency department today with dental pain.  I have suspicion for developing infection.  I am starting you on an antibiotic which should improve your symptoms.  Take Motrin as needed for pain.  Please follow-up with your dentist for further evaluation and treatment as soon as you are able.

## 2018-12-31 ENCOUNTER — Encounter (HOSPITAL_BASED_OUTPATIENT_CLINIC_OR_DEPARTMENT_OTHER): Payer: Self-pay | Admitting: *Deleted

## 2018-12-31 ENCOUNTER — Emergency Department (HOSPITAL_BASED_OUTPATIENT_CLINIC_OR_DEPARTMENT_OTHER)
Admission: EM | Admit: 2018-12-31 | Discharge: 2018-12-31 | Disposition: A | Discharge status: Home / Self Care | Payer: Self-pay | Attending: Emergency Medicine | Admitting: Emergency Medicine

## 2018-12-31 ENCOUNTER — Other Ambulatory Visit: Payer: Self-pay

## 2018-12-31 DIAGNOSIS — R519 Headache, unspecified: Secondary | ICD-10-CM

## 2018-12-31 DIAGNOSIS — I10 Essential (primary) hypertension: Secondary | ICD-10-CM | POA: Insufficient documentation

## 2018-12-31 DIAGNOSIS — Z87891 Personal history of nicotine dependence: Secondary | ICD-10-CM | POA: Insufficient documentation

## 2018-12-31 MED ORDER — AMLODIPINE BESYLATE 5 MG PO TABS: 5.0000 mg | ORAL_TABLET | Freq: Every day | ORAL | 0 refills | Status: DC

## 2018-12-31 MED FILL — AMLODIPINE BESYLATE 5 MG TA: 5 | 30 days supply | Qty: 30 | Fill #0

## 2018-12-31 NOTE — ED Triage Notes (Signed)
Headache x 4 days. States he is here to see if he can get a Rx refill for his BP medication, Amlodipine. He ran out 2 months ago.

## 2018-12-31 NOTE — ED Provider Notes (Signed)
Emergency Department Provider Note   I have reviewed the triage vital signs and the nursing notes.   HISTORY  Chief Complaint Medication Refill   HPI Todd Pena is a 33 y.o. male with PMH of HTN but not on medication currently and without PCP presents to the emergency department with mild, diffuse headache over the past several days.  Patient states he has similar headaches in the past related to elevated blood pressure.  He denies any sudden onset, maximal intensity headache symptoms.  He states that he supposed to be taking amlodipine but ran out of the prescription  2 months ago and is requesting a refill.  He denies any weakness, numbness, vision change, or speech change.  No fevers or chills.  No chest pain or shortness of breath.  Past Medical History:  Diagnosis Date  . Hypertension     Past Surgical History:  Procedure Laterality Date  . FRACTURE SURGERY      Allergies Patient has no known allergies.  No family history on file.  Social History Social History   Tobacco Use  . Smoking status: Former Smoker    Types: Cigarettes  . Smokeless tobacco: Never Used  Substance Use Topics  . Alcohol use: No  . Drug use: No    Review of Systems  Constitutional: No fever/chills Eyes: No visual changes. ENT: No sore throat. Cardiovascular: Denies chest pain. Respiratory: Denies shortness of breath. Gastrointestinal: No abdominal pain.  No nausea, no vomiting.  No diarrhea.  No constipation. Genitourinary: Negative for dysuria. Musculoskeletal: Negative for back pain. Skin: Negative for rash. Neurological: Negative for focal weakness or numbness. Positive HA.   10-point ROS otherwise negative.  ____________________________________________   PHYSICAL EXAM:  VITAL SIGNS: ED Triage Vitals  Enc Vitals Group     BP 12/31/18 1238 (!) 170/129     Pulse Rate 12/31/18 1238 77     Resp 12/31/18 1238 16     Temp 12/31/18 1238 98 F (36.7 C)     Temp  Source 12/31/18 1238 Oral     SpO2 12/31/18 1238 96 %     Weight 12/31/18 1236 260 lb (117.9 kg)     Height 12/31/18 1236 5' 10.5" (1.791 m)   Constitutional: Alert and oriented. Well appearing and in no acute distress. Eyes: Conjunctivae are normal. PERRL. EOMI. Head: Atraumatic. Nose: No congestion/rhinnorhea. Mouth/Throat: Mucous membranes are moist.  Neck: No stridor.  Cardiovascular: Normal rate, regular rhythm. Good peripheral circulation. Grossly normal heart sounds.   Respiratory: Normal respiratory effort.  No retractions. Lungs CTAB. Gastrointestinal: No distention.  Musculoskeletal: No gross deformities of extremities. Neurologic:  Normal speech and language. No gross focal neurologic deficits are appreciated.  Skin:  Skin is warm, dry and intact. No rash noted.  ____________________________________________   PROCEDURES  Procedure(s) performed:   Procedures  None ____________________________________________   INITIAL IMPRESSION / ASSESSMENT AND PLAN / ED COURSE  Pertinent labs & imaging results that were available during my care of the patient were reviewed by me and considered in my medical decision making (see chart for details).   Patient presents to the emergency department with 4 days of mild headache.  He has no focal neuro deficits or historical features to raise concern for subarachnoid hemorrhage, meningitis, or other headache emergency.  Patient's blood pressure is elevated here with no evidence of hypertensive emergency clinically.  Plan to restart the patient's amlodipine.  We discussed the need for close PCP follow-up.  I provided contact information  for local clinic which can assist him with his insurance issues and follow his blood pressure medications. Discussed ED return precautions.    ____________________________________________  FINAL CLINICAL IMPRESSION(S) / ED DIAGNOSES  Final diagnoses:  Essential hypertension  Acute nonintractable  headache, unspecified headache type    Note:  This document was prepared using Dragon voice recognition software and may include unintentional dictation errors.  Nanda Quinton, MD, [facility] Emergency Medicine    Ruhani Umland, [name], MD 12/31/18 [phone]

## 2018-12-31 NOTE — Discharge Instructions (Signed)
You were seen in the emergency department today with elevated blood pressure.  I have refilled your medication but need to call the primary care doctor listed schedule the next available appointment.  Please take Tylenol and/or Motrin as needed for headache pain.  Return to the emergency department with any weakness, numbness, sudden vision change, sudden severe headache.

## 2019-01-17 ENCOUNTER — Encounter (HOSPITAL_BASED_OUTPATIENT_CLINIC_OR_DEPARTMENT_OTHER): Payer: Self-pay

## 2019-01-17 ENCOUNTER — Emergency Department (HOSPITAL_BASED_OUTPATIENT_CLINIC_OR_DEPARTMENT_OTHER)
Admission: EM | Admit: 2019-01-17 | Discharge: 2019-01-17 | Disposition: A | Discharge status: Home / Self Care | Payer: Self-pay | Attending: Emergency Medicine | Admitting: Emergency Medicine

## 2019-01-17 ENCOUNTER — Other Ambulatory Visit: Payer: Self-pay

## 2019-01-17 DIAGNOSIS — K029 Dental caries, unspecified: Secondary | ICD-10-CM

## 2019-01-17 DIAGNOSIS — Z79899 Other long term (current) drug therapy: Secondary | ICD-10-CM | POA: Insufficient documentation

## 2019-01-17 DIAGNOSIS — I10 Essential (primary) hypertension: Secondary | ICD-10-CM | POA: Insufficient documentation

## 2019-01-17 DIAGNOSIS — K0889 Other specified disorders of teeth and supporting structures: Secondary | ICD-10-CM | POA: Insufficient documentation

## 2019-01-17 DIAGNOSIS — Z87891 Personal history of nicotine dependence: Secondary | ICD-10-CM | POA: Insufficient documentation

## 2019-01-17 MED ORDER — AMOXICILLIN 500 MG PO CAPS: 500.0000 mg | ORAL_CAPSULE | Freq: Three times a day (TID) | ORAL | 0 refills | Status: DC

## 2019-01-17 MED ORDER — HYDROCODONE-ACETAMINOPHEN 5-325 MG PO TABS: 1.0000 | ORAL_TABLET | ORAL | 0 refills | Status: DC | PRN

## 2019-01-17 MED ORDER — AMOXICILLIN 500 MG PO CAPS
500.0000 mg | ORAL_CAPSULE | Freq: Once | ORAL | Status: AC
  Administered 2019-01-17: 23:00:00 500 mg via ORAL
  Filled 2019-01-17: qty 1

## 2019-01-17 MED ORDER — BUPIVACAINE-EPINEPHRINE (PF) 0.5% -1:200000 IJ SOLN
1.8000 mL | Freq: Once | INTRAMUSCULAR | Status: AC
  Administered 2019-01-17: 23:00:00 1.8 mL
  Filled 2019-01-17: qty 1.8

## 2019-01-17 NOTE — ED Provider Notes (Signed)
[place] [facility] Provider Note: Todd Spurling, MD, FACEP  CSN: [id number] MRN: [id number] ARRIVAL: 01/17/19 at 2131 ROOM: MHFT1/MHFT1   CHIEF COMPLAINT  Dental Pain   HISTORY OF PRESENT ILLNESS  01/17/19 10:58 PM Todd Pena is a 33 y.o. male with a known carious left lower second molar.  He has seen his dentist about this and has follow-up planned for tomorrow.  He is here because that tooth began hurting about 2 hours ago.  He rates the pain as a 10 out of 10, worse with eating or drinking.  He is not on an antibiotic.   Past Medical History:  Diagnosis Date  . Hypertension     Past Surgical History:  Procedure Laterality Date  . FRACTURE SURGERY      No family history on file.  Social History   Tobacco Use  . Smoking status: Former Smoker    Types: Cigarettes  . Smokeless tobacco: Never Used  Substance Use Topics  . Alcohol use: No  . Drug use: No    Prior to Admission medications   Medication Sig Start Date End Date Taking? Authorizing Provider  amLODipine (NORVASC) 5 MG tablet Take 1 tablet (5 mg total) by mouth daily. 12/31/18 01/30/19  [name], [name], MD  amoxicillin (AMOXIL) 500 MG capsule Take 1 capsule (500 mg total) by mouth 3 (three) times daily. 01/17/19   Todd Fariss, MD  HYDROcodone-acetaminophen (NORCO) 5-325 MG tablet Take 1 tablet by mouth every 4 (four) hours as needed for severe pain. 01/17/19   Todd Waltermire, MD  ibuprofen (ADVIL) 800 MG tablet Take 1 tablet (800 mg total) by mouth every 8 (eight) hours as needed for moderate pain. 11/11/18   [name], [name], MD    Allergies Patient has no known allergies.   REVIEW OF SYSTEMS  Negative except as noted here or in the History of Present Illness.   PHYSICAL EXAMINATION  Initial Vital Signs Blood pressure (!) 176/118, pulse 84, temperature 98.8 F (37.1 C), temperature source Oral, resp. rate 18, height 5\' 11"  (1.803 m), weight 117.9 kg, SpO2 96 %.  Examination General:  Well-developed, well-nourished male in no acute distress; appearance consistent with age of record HENT: normocephalic; atraumatic; left lower second molar carious to gumline Eyes: Normal appearance Neck: supple; no lymphadenopathy Heart: regular rate and rhythm; no murmurs, rubs or gallops Lungs: clear to auscultation bilaterally Abdomen: soft; nondistended; nontender; bowel sounds present Extremities: No deformity; full range of motion Neurologic: Awake, alert and oriented; motor function intact in all extremities and symmetric; no facial droop Skin: Warm and dry Psychiatric: Normal mood and affect   RESULTS  Summary of this visit's results, reviewed and interpreted by myself:   EKG Interpretation  Date/Time:    Ventricular Rate:    PR Interval:    QRS Duration:   QT Interval:    QTC Calculation:   R Axis:     Text Interpretation:        Laboratory Studies: No results found for this or any previous visit (from the past 24 hour(s)). Imaging Studies: No results found.  ED COURSE and MDM  Nursing notes, initial and subsequent vitals signs, including pulse oximetry, reviewed and interpreted by myself.  Vitals:   01/17/19 2140  BP: (!) 176/118  Pulse: 84  Resp: 18  Temp: 98.8 F (37.1 C)  TempSrc: Oral  SpO2: 96%  Weight: 117.9 kg  Height: 5\' 11"  (1.803 m)   Medications  bupivacaine-epinephrine (MARCAINE W/ EPI) 0.5% -  1:200000 injection 1.8 mL (1.8 mLs Infiltration Given 01/17/19 2306)  amoxicillin (AMOXIL) capsule 500 mg (500 mg Oral Given 01/17/19 2306)    We will start patient on amoxicillin pending definitive dental follow-up.  He is supposed to get in contact with his dentist tomorrow.  PROCEDURES  Procedures  DENTAL BLOCK 1.8 milliliters of 0.5% bupivacaine with epinephrine were injected into the buccal fold adjacent to the left lower second molar. The patient tolerated this well and there were no immediate complications. Adequate analgesia was obtained.    ED DIAGNOSES     ICD-10-CM   1. Pain due to dental caries  K02.9        Todd Robertshaw, MD 01/17/19 2312

## 2019-01-17 NOTE — ED Triage Notes (Signed)
Pt c/o left lower toothache x "few hours"-hx of same-NAD-steady gait

## 2019-01-20 ENCOUNTER — Other Ambulatory Visit: Payer: Self-pay | Admitting: Cardiology

## 2019-01-20 DIAGNOSIS — Z20822 Contact with and (suspected) exposure to covid-19: Secondary | ICD-10-CM

## 2019-01-21 LAB — NOVEL CORONAVIRUS, NAA: SARS-CoV-2, NAA: NOT DETECTED

## 2020-12-06 IMAGING — CR DG CHEST 2V
2 series · 2 of 2 positions shown · non-contrast
Comparison: Chest x-ray of 12/02/2011

CLINICAL DATA: Palpitations, arm numbness began last night,
shortness of breath and dizziness

EXAM:
CHEST - 2 VIEW

[w chest pa]
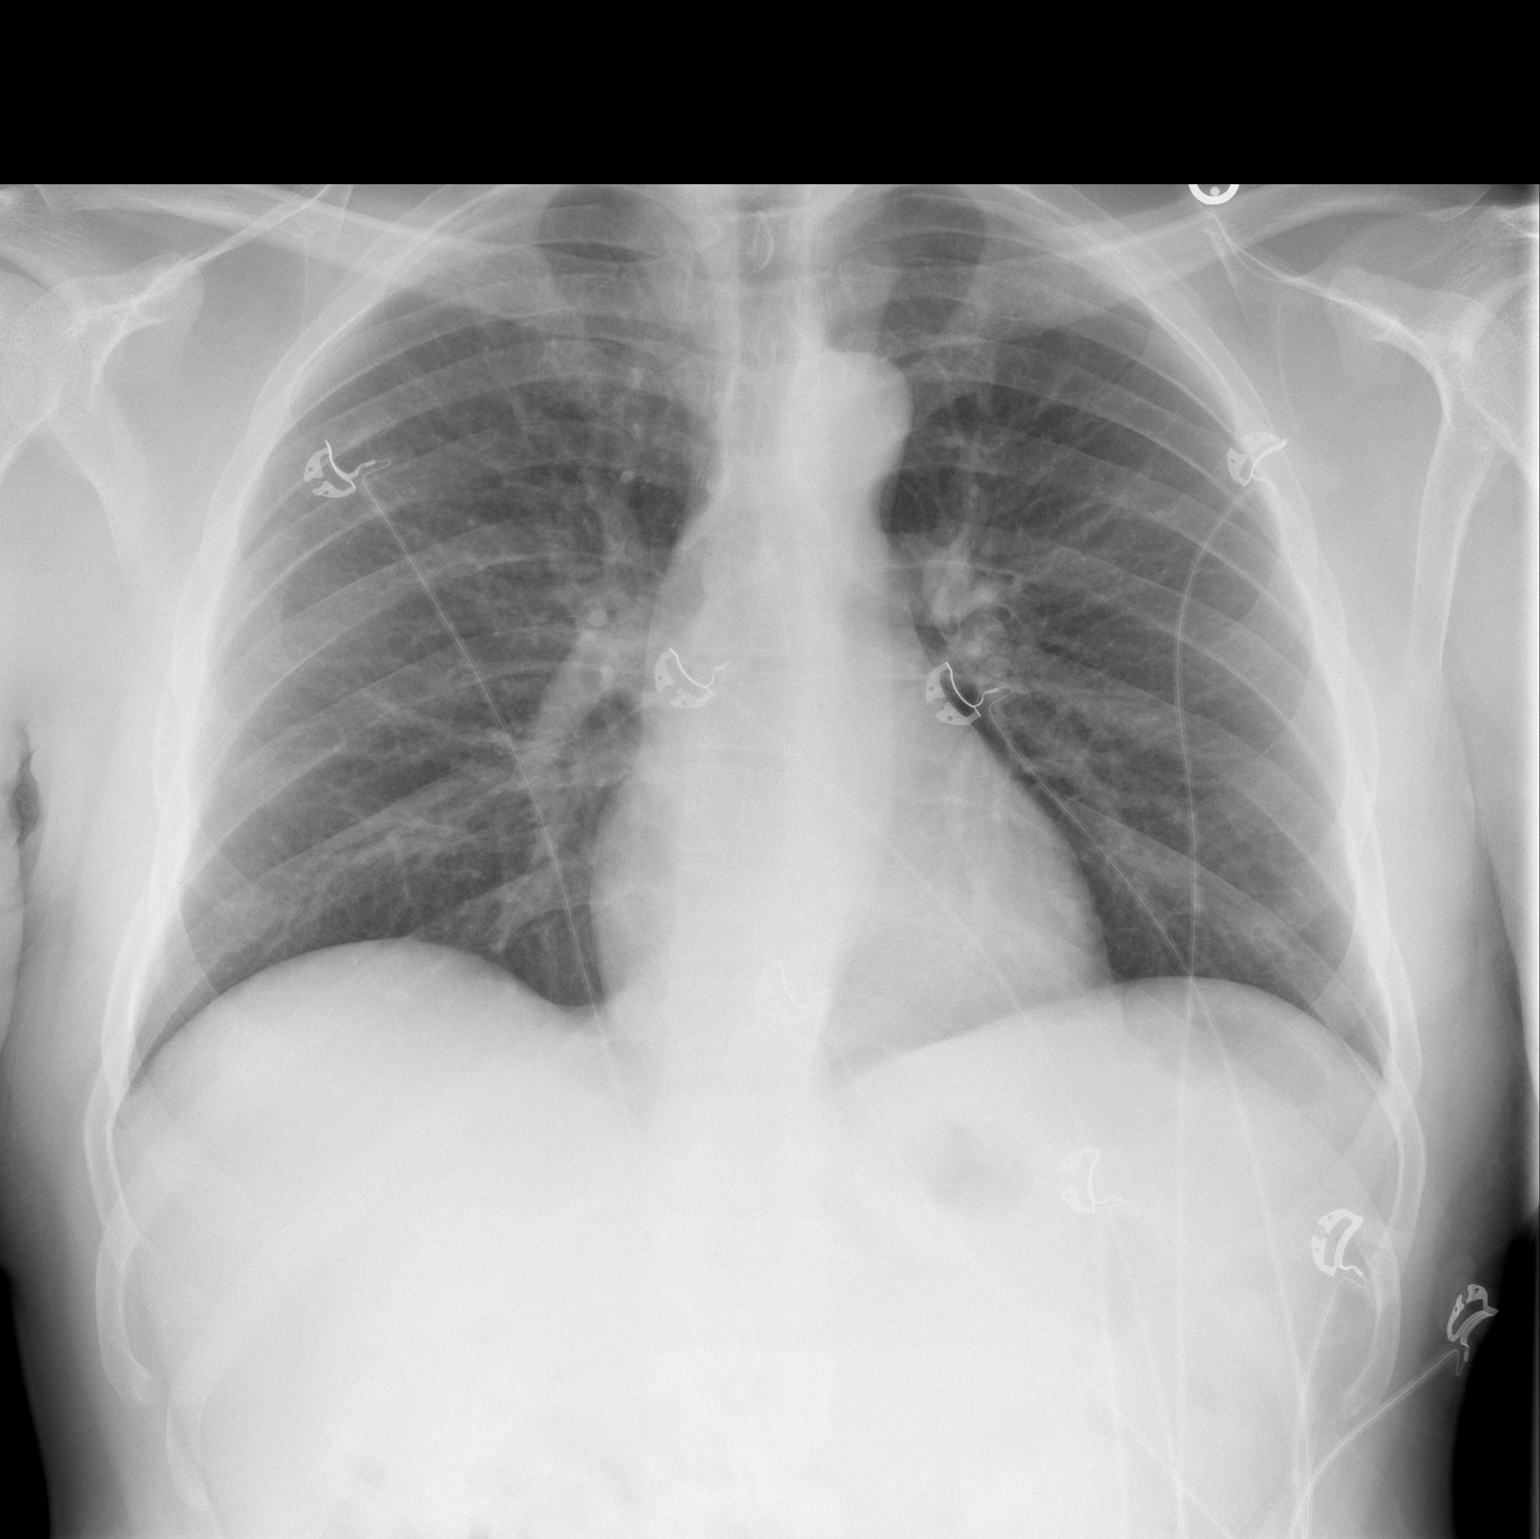

[w chest lat]
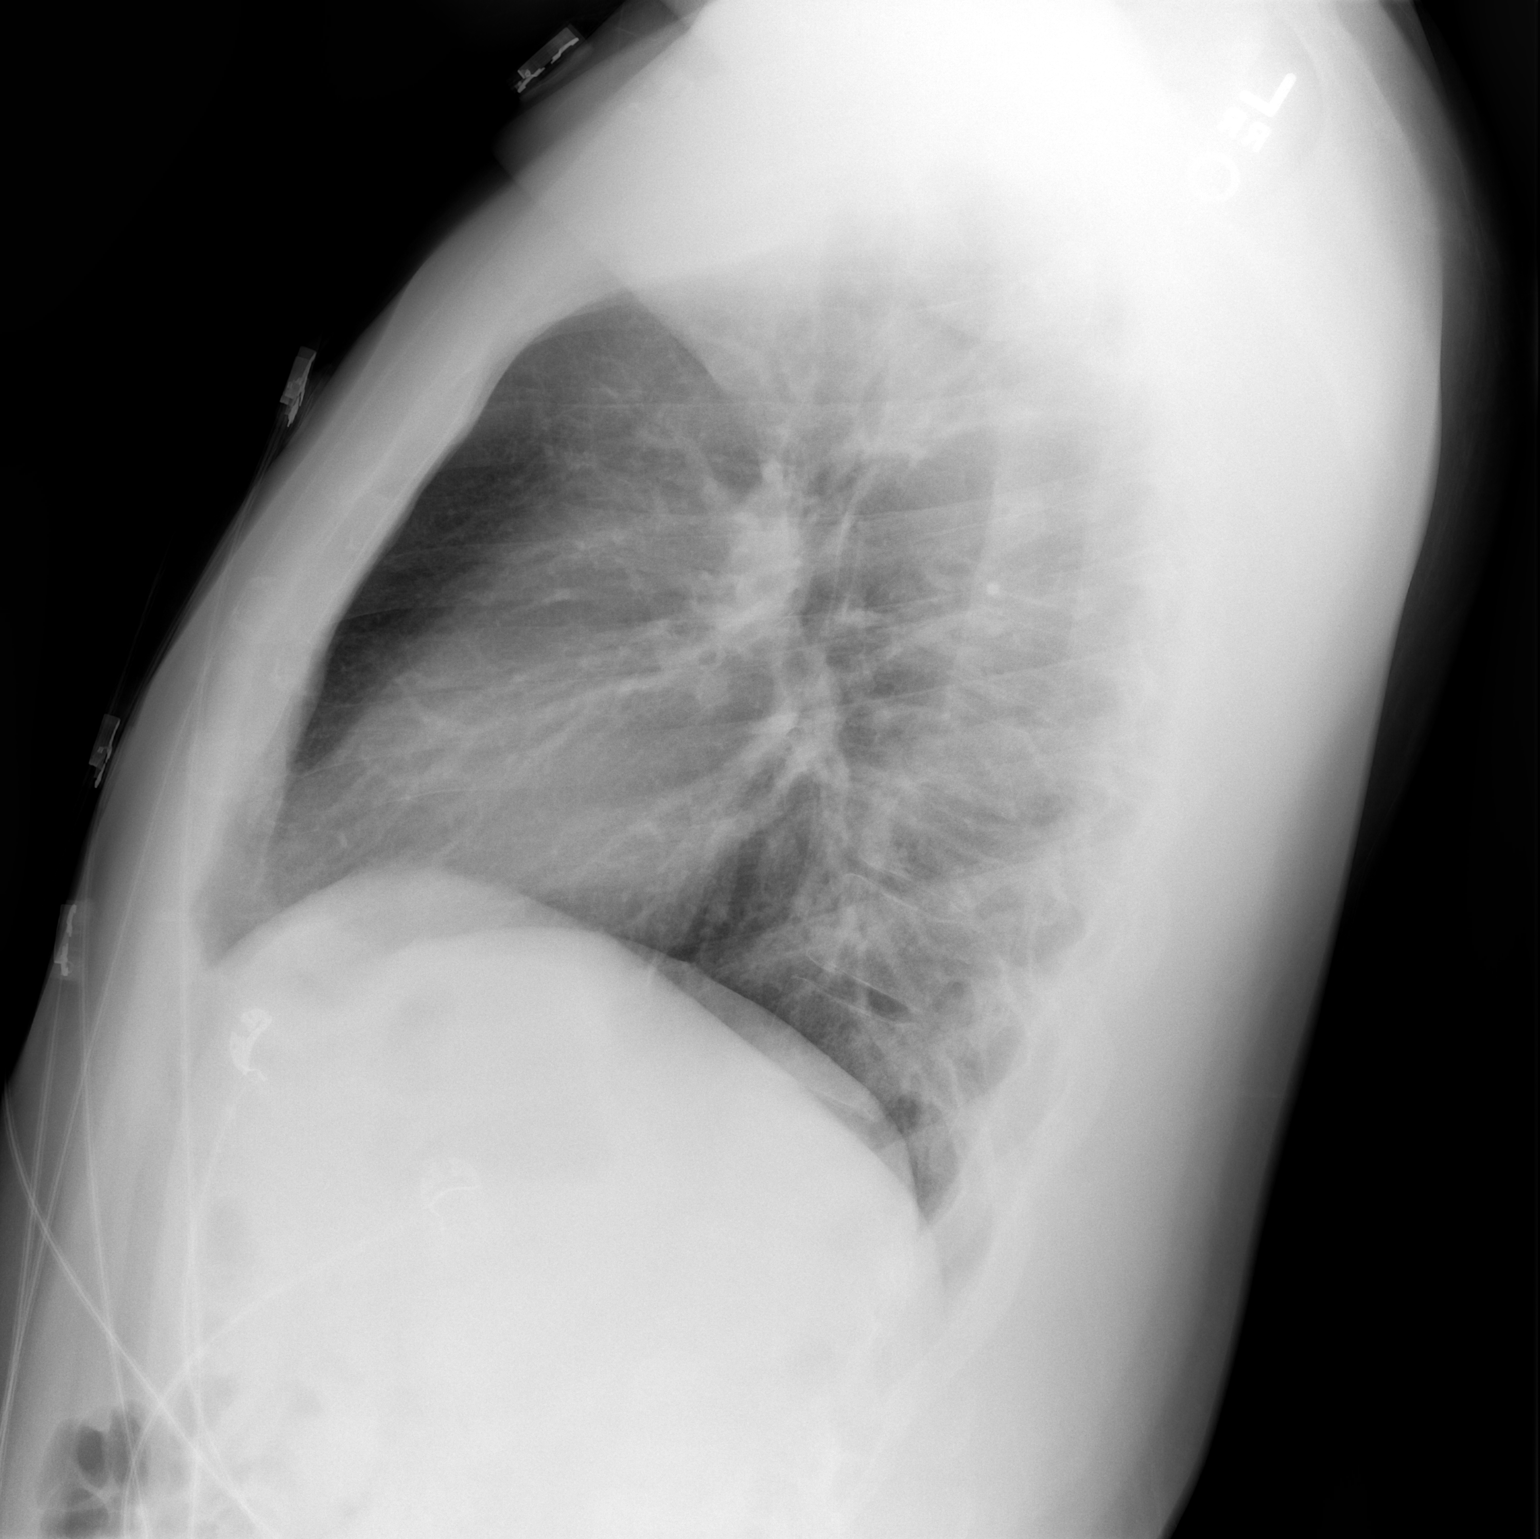

[2 of 2 positions shown; findings below may reference images not displayed]

FINDINGS: No active infiltrate or effusion is seen. Mediastinal and hilar
contours are unremarkable. The heart is within normal limits in
size. No bony abnormality is seen.
IMPRESSION: No active cardiopulmonary disease.

## 2021-04-06 ENCOUNTER — Emergency Department (HOSPITAL_BASED_OUTPATIENT_CLINIC_OR_DEPARTMENT_OTHER)
Admission: EM | Admit: 2021-04-06 | Discharge: 2021-04-07 | Disposition: A | Discharge status: Home / Self Care | Payer: Self-pay | Attending: Emergency Medicine | Admitting: Emergency Medicine

## 2021-04-06 ENCOUNTER — Other Ambulatory Visit: Payer: Self-pay

## 2021-04-06 ENCOUNTER — Encounter (HOSPITAL_BASED_OUTPATIENT_CLINIC_OR_DEPARTMENT_OTHER): Payer: Self-pay | Admitting: *Deleted

## 2021-04-06 DIAGNOSIS — R5383 Other fatigue: Secondary | ICD-10-CM | POA: Insufficient documentation

## 2021-04-06 DIAGNOSIS — Z5321 Procedure and treatment not carried out due to patient leaving prior to being seen by health care provider: Secondary | ICD-10-CM | POA: Insufficient documentation

## 2021-04-06 DIAGNOSIS — J029 Acute pharyngitis, unspecified: Secondary | ICD-10-CM | POA: Insufficient documentation

## 2021-04-06 DIAGNOSIS — M791 Myalgia, unspecified site: Secondary | ICD-10-CM | POA: Insufficient documentation

## 2021-04-06 DIAGNOSIS — R519 Headache, unspecified: Secondary | ICD-10-CM | POA: Insufficient documentation

## 2021-04-06 DIAGNOSIS — R0981 Nasal congestion: Secondary | ICD-10-CM | POA: Insufficient documentation

## 2021-04-06 DIAGNOSIS — R509 Fever, unspecified: Secondary | ICD-10-CM | POA: Insufficient documentation

## 2021-04-06 DIAGNOSIS — R059 Cough, unspecified: Secondary | ICD-10-CM | POA: Insufficient documentation

## 2021-04-06 DIAGNOSIS — Z20822 Contact with and (suspected) exposure to covid-19: Secondary | ICD-10-CM | POA: Insufficient documentation

## 2021-04-06 LAB — RESP PANEL BY RT-PCR (FLU A&B, COVID) ARPGX2
Influenza A by PCR: POSITIVE — AB
Influenza B by PCR: NEGATIVE
SARS Coronavirus 2 by RT PCR: NEGATIVE

## 2021-04-06 NOTE — ED Triage Notes (Signed)
Fever, chills, headache, body aches, cough, fatigue, runny nose, sore throat.

## 2021-04-07 NOTE — ED Notes (Signed)
Pt states he had to leave due to child care issues  Informed pt that the provider was working hard to get to him but had several pts to see when she got here  Pt states he does not want his children left at home alone so he had to leave  Informed pt that he had tested positive for influenza A and that he could use tylenol and motrin at home for fever and bodyaches and to drink plenty of fluids and get plenty of rest  Instructed pt to return for increased shortness of breath, uncontrolled vomiting or diarrhea, fever uncontrolled with the tylenol or motrin or any other concerning symptoms  Pt verbalized an understanding

## 2021-10-13 ENCOUNTER — Emergency Department (HOSPITAL_BASED_OUTPATIENT_CLINIC_OR_DEPARTMENT_OTHER): Payer: Self-pay

## 2021-10-13 ENCOUNTER — Emergency Department (HOSPITAL_BASED_OUTPATIENT_CLINIC_OR_DEPARTMENT_OTHER)
Admission: EM | Admit: 2021-10-13 | Discharge: 2021-10-13 | Disposition: A | Discharge status: Home / Self Care | Payer: Self-pay | Attending: Emergency Medicine | Admitting: Emergency Medicine

## 2021-10-13 ENCOUNTER — Encounter (HOSPITAL_BASED_OUTPATIENT_CLINIC_OR_DEPARTMENT_OTHER): Payer: Self-pay | Admitting: Emergency Medicine

## 2021-10-13 DIAGNOSIS — Z20822 Contact with and (suspected) exposure to covid-19: Secondary | ICD-10-CM | POA: Insufficient documentation

## 2021-10-13 DIAGNOSIS — M546 Pain in thoracic spine: Secondary | ICD-10-CM | POA: Insufficient documentation

## 2021-10-13 DIAGNOSIS — J181 Lobar pneumonia, unspecified organism: Secondary | ICD-10-CM | POA: Insufficient documentation

## 2021-10-13 DIAGNOSIS — J189 Pneumonia, unspecified organism: Secondary | ICD-10-CM

## 2021-10-13 LAB — SARS CORONAVIRUS 2 BY RT PCR: SARS Coronavirus 2 by RT PCR: NEGATIVE

## 2021-10-13 MED ORDER — BENZONATATE 100 MG PO CAPS: 100.0000 mg | ORAL_CAPSULE | Freq: Three times a day (TID) | ORAL | 0 refills | Status: DC

## 2021-10-13 MED ORDER — DOXYCYCLINE HYCLATE 100 MG PO TABS
100.0000 mg | ORAL_TABLET | Freq: Once | ORAL | Status: AC
  Administered 2021-10-13: 100 mg via ORAL
  Filled 2021-10-13: qty 1

## 2021-10-13 MED ORDER — DOXYCYCLINE HYCLATE 100 MG PO CAPS: 100.0000 mg | ORAL_CAPSULE | Freq: Two times a day (BID) | ORAL | 0 refills | Status: DC

## 2021-10-13 NOTE — ED Notes (Signed)
Discharge instructions reviewed with patient. Patient questions answered and opportunity for education reviewed. Patient voices understanding of discharge instructions with no further questions. Patient ambulatory with steady gait to lobby.  

## 2021-10-13 NOTE — ED Provider Notes (Signed)
[facility] EMERGENCY DEPARTMENT Provider Note   CSN: [id number] Arrival date & time: 10/13/21  2034     History  Chief Complaint  Patient presents with   Back Pain    Todd Pena is a 36 y.o. male.  36 yo M with a chief complaints of right thoracic back pain.  This has been going on for about a week or so.  Has been having some cough and difficulty breathing as well.  He went to a chiropractor and had some transient improvement and then worsened again over the past couple days.  No fevers but has felt subjectively hot and cold.  No known sick contacts.   Back Pain      Home Medications Prior to Admission medications   Medication Sig Start Date End Date Taking? Authorizing Provider  amLODipine (NORVASC) 5 MG tablet Take 1 tablet (5 mg total) by mouth daily. 12/31/18 01/30/19  Long, Arlyss Repress, MD  amoxicillin (AMOXIL) 500 MG capsule Take 1 capsule (500 mg total) by mouth 3 (three) times daily. 01/17/19   Molpus, John, MD  benzonatate (TESSALON) 100 MG capsule Take 1 capsule (100 mg total) by mouth every 8 (eight) hours. 10/13/21   Melene Plan, DO  doxycycline (VIBRAMYCIN) 100 MG capsule Take 1 capsule (100 mg total) by mouth 2 (two) times daily. One po bid x 7 days 10/13/21   Melene Plan, DO  HYDROcodone-acetaminophen (NORCO) 5-325 MG tablet Take 1 tablet by mouth every 4 (four) hours as needed for severe pain. 01/17/19   Molpus, John, MD  ibuprofen (ADVIL) 800 MG tablet Take 1 tablet (800 mg total) by mouth every 8 (eight) hours as needed for moderate pain. 11/11/18   Long, Arlyss Repress, MD      Allergies    Patient has no known allergies.    Review of Systems   Review of Systems  Musculoskeletal:  Positive for back pain.    Physical Exam Updated Vital Signs BP (!) 168/100   Pulse 89   Temp 98.7 F (37.1 C) (Oral)   Resp 20   Ht 5\' 11"  (1.803 m)   Wt 127 kg   SpO2 99%   BMI 39.05 kg/m  Physical Exam Vitals and nursing note reviewed.  Constitutional:       Appearance: He is well-developed.  HENT:     Head: Normocephalic and atraumatic.  Eyes:     Pupils: Pupils are equal, round, and reactive to light.  Neck:     Vascular: No JVD.  Cardiovascular:     Rate and Rhythm: Normal rate and regular rhythm.     Heart sounds: No murmur heard.    No friction rub. No gallop.  Pulmonary:     Effort: No respiratory distress.     Breath sounds: No wheezing.  Abdominal:     General: There is no distension.     Tenderness: There is no abdominal tenderness. There is no guarding or rebound.  Musculoskeletal:        General: Normal range of motion.     Cervical back: Normal range of motion and neck supple.  Skin:    Coloration: Skin is not pale.     Findings: No rash.  Neurological:     Mental Status: He is alert and oriented to person, place, and time.  Psychiatric:        Behavior: Behavior normal.     ED Results / Procedures / Treatments   Labs (all labs ordered are listed,  but only abnormal results are displayed) Labs Reviewed  SARS CORONAVIRUS 2 BY RT PCR    EKG None  Radiology DG Chest 2 View  Result Date: 10/13/2021 CLINICAL DATA:  Cough. EXAM: CHEST - 2 VIEW COMPARISON:  Chest x-ray 03/06/2018 FINDINGS: There is patchy airspace disease in the left lower lobe. There is some minimal atelectasis in the right mid lung. Lung volumes are low. No pleural effusion or pneumothorax. Cardiomediastinal silhouette within normal limits. No acute fractures. IMPRESSION: Left lower lobe airspace disease worrisome for pneumonia. Follow-up chest x-ray recommended in 4-6 weeks to confirm resolution. Low lung volumes. Electronically Signed   By: Darliss Cheney M.D.   On: 10/13/2021 21:07    Procedures Procedures    Medications Ordered in ED Medications  doxycycline (VIBRA-TABS) tablet 100 mg (100 mg Oral Given 10/13/21 2243)    ED Course/ Medical Decision Making/ A&P                           Medical Decision Making Amount and/or Complexity of  Data Reviewed Radiology: ordered.  Risk Prescription drug management.   36 yo M with a chief complaint of right thoracic back pain.  Going on for about a week.  Associated with cough and shortness of breath.  Chest x-ray independently interpreted by me with the right lower lobe infiltrate.  Will treat for pneumonia.  PCP follow-up.  10:50 PM:  I have discussed the diagnosis/risks/treatment options with the patient.  Evaluation and diagnostic testing in the emergency department does not suggest an emergent condition requiring admission or immediate intervention beyond what has been performed at this time.  They will follow up with  PCP. We also discussed returning to the ED immediately if new or worsening sx occur. We discussed the sx which are most concerning (e.g., sudden worsening pain, fever, inability to tolerate by mouth) that necessitate immediate return. Medications administered to the patient during their visit and any new prescriptions provided to the patient are listed below.  Medications given during this visit Medications  doxycycline (VIBRA-TABS) tablet 100 mg (100 mg Oral Given 10/13/21 2243)     The patient appears reasonably screen and/or stabilized for discharge and I doubt any other medical condition or other [facility] requiring further screening, evaluation, or treatment in the ED at this time prior to discharge.          Final Clinical Impression(s) / ED Diagnoses Final diagnoses:  Community acquired pneumonia of right lower lobe of lung    Rx / DC Orders ED Discharge Orders          Ordered    doxycycline (VIBRAMYCIN) 100 MG capsule  2 times daily,   Status:  Discontinued        10/13/21 2236    benzonatate (TESSALON) 100 MG capsule  Every 8 hours,   Status:  Discontinued        10/13/21 2236    benzonatate (TESSALON) 100 MG capsule  Every 8 hours        10/13/21 2243    doxycycline (VIBRAMYCIN) 100 MG capsule  2 times daily        10/13/21 2243               Melene Plan, DO 10/13/21 2250

## 2021-10-13 NOTE — Discharge Instructions (Addendum)
Take tylenol 2 pills 4 times a day and motrin 4 pills 3 times a day.  Drink plenty of fluids.  Return for worsening shortness of breath, headache, confusion. Follow up with your family doctor.   

## 2021-10-13 NOTE — ED Triage Notes (Addendum)
Pt reports RT side back pain since Sat; also reports fatigue and some difficulty breathing; prod cough today which is mostly blood per pt; took acetaminophen/ibuprofen combo 3 hrs PTA

## 2021-10-16 ENCOUNTER — Emergency Department (HOSPITAL_BASED_OUTPATIENT_CLINIC_OR_DEPARTMENT_OTHER): Payer: Self-pay

## 2021-10-16 ENCOUNTER — Other Ambulatory Visit: Payer: Self-pay

## 2021-10-16 ENCOUNTER — Encounter (HOSPITAL_BASED_OUTPATIENT_CLINIC_OR_DEPARTMENT_OTHER): Payer: Self-pay | Admitting: Pediatrics

## 2021-10-16 ENCOUNTER — Encounter (HOSPITAL_COMMUNITY): Payer: Self-pay

## 2021-10-16 ENCOUNTER — Inpatient Hospital Stay (HOSPITAL_BASED_OUTPATIENT_CLINIC_OR_DEPARTMENT_OTHER)
Admission: EM | Admit: 2021-10-16 | Discharge: 2021-10-30 | DRG: 853 | Disposition: A | Discharge status: Home / Self Care | Payer: Self-pay | Attending: Internal Medicine | Admitting: Internal Medicine

## 2021-10-16 DIAGNOSIS — Z6841 Body Mass Index (BMI) 40.0 and over, adult: Secondary | ICD-10-CM

## 2021-10-16 DIAGNOSIS — J159 Unspecified bacterial pneumonia: Secondary | ICD-10-CM | POA: Diagnosis present

## 2021-10-16 DIAGNOSIS — Z91199 Patient's noncompliance with other medical treatment and regimen due to unspecified reason: Secondary | ICD-10-CM

## 2021-10-16 DIAGNOSIS — J869 Pyothorax without fistula: Secondary | ICD-10-CM | POA: Diagnosis present

## 2021-10-16 DIAGNOSIS — G4700 Insomnia, unspecified: Secondary | ICD-10-CM | POA: Diagnosis present

## 2021-10-16 DIAGNOSIS — T461X6A Underdosing of calcium-channel blockers, initial encounter: Secondary | ICD-10-CM | POA: Diagnosis present

## 2021-10-16 DIAGNOSIS — Z87891 Personal history of nicotine dependence: Secondary | ICD-10-CM

## 2021-10-16 DIAGNOSIS — J9601 Acute respiratory failure with hypoxia: Secondary | ICD-10-CM | POA: Diagnosis present

## 2021-10-16 DIAGNOSIS — I10 Essential (primary) hypertension: Secondary | ICD-10-CM | POA: Diagnosis present

## 2021-10-16 DIAGNOSIS — J918 Pleural effusion in other conditions classified elsewhere: Secondary | ICD-10-CM | POA: Diagnosis present

## 2021-10-16 DIAGNOSIS — Z713 Dietary counseling and surveillance: Secondary | ICD-10-CM

## 2021-10-16 DIAGNOSIS — I77810 Thoracic aortic ectasia: Secondary | ICD-10-CM | POA: Diagnosis present

## 2021-10-16 DIAGNOSIS — A419 Sepsis, unspecified organism: Principal | ICD-10-CM | POA: Diagnosis present

## 2021-10-16 DIAGNOSIS — R197 Diarrhea, unspecified: Secondary | ICD-10-CM | POA: Diagnosis present

## 2021-10-16 DIAGNOSIS — J90 Pleural effusion, not elsewhere classified: Secondary | ICD-10-CM | POA: Diagnosis present

## 2021-10-16 DIAGNOSIS — Z20822 Contact with and (suspected) exposure to covid-19: Secondary | ICD-10-CM | POA: Diagnosis present

## 2021-10-16 DIAGNOSIS — R042 Hemoptysis: Secondary | ICD-10-CM | POA: Diagnosis present

## 2021-10-16 DIAGNOSIS — G9341 Metabolic encephalopathy: Secondary | ICD-10-CM | POA: Diagnosis present

## 2021-10-16 DIAGNOSIS — J9811 Atelectasis: Secondary | ICD-10-CM | POA: Diagnosis present

## 2021-10-16 DIAGNOSIS — Z79899 Other long term (current) drug therapy: Secondary | ICD-10-CM

## 2021-10-16 DIAGNOSIS — J189 Pneumonia, unspecified organism: Secondary | ICD-10-CM | POA: Diagnosis present

## 2021-10-16 DIAGNOSIS — D75839 Thrombocytosis, unspecified: Secondary | ICD-10-CM | POA: Diagnosis present

## 2021-10-16 LAB — URINALYSIS, ROUTINE W REFLEX MICROSCOPIC
Bilirubin Urine: NEGATIVE
Glucose, UA: NEGATIVE mg/dL
Hgb urine dipstick: NEGATIVE
Ketones, ur: NEGATIVE mg/dL
Leukocytes,Ua: NEGATIVE
Nitrite: NEGATIVE
Protein, ur: NEGATIVE mg/dL
Specific Gravity, Urine: 1.01 (ref 1.005–1.030)
pH: 7 (ref 5.0–8.0)

## 2021-10-16 LAB — CBC WITH DIFFERENTIAL/PLATELET
Abs Immature Granulocytes: 0.12 10*3/uL — ABNORMAL HIGH (ref 0.00–0.07)
Basophils Absolute: 0.1 10*3/uL (ref 0.0–0.1)
Basophils Relative: 1 %
Eosinophils Absolute: 0 10*3/uL (ref 0.0–0.5)
Eosinophils Relative: 0 %
HCT: 43.3 % (ref 39.0–52.0)
Hemoglobin: 14.8 g/dL (ref 13.0–17.0)
Immature Granulocytes: 1 %
Lymphocytes Relative: 10 %
Lymphs Abs: 2 10*3/uL (ref 0.7–4.0)
MCH: 29.6 pg (ref 26.0–34.0)
MCHC: 34.2 g/dL (ref 30.0–36.0)
MCV: 86.6 fL (ref 80.0–100.0)
Monocytes Absolute: 2.1 10*3/uL — ABNORMAL HIGH (ref 0.1–1.0)
Monocytes Relative: 10 %
Neutro Abs: 15.6 10*3/uL — ABNORMAL HIGH (ref 1.7–7.7)
Neutrophils Relative %: 78 %
Platelets: 729 10*3/uL — ABNORMAL HIGH (ref 150–400)
RBC: 5 MIL/uL (ref 4.22–5.81)
RDW: 12.3 % (ref 11.5–15.5)
WBC: 19.9 10*3/uL — ABNORMAL HIGH (ref 4.0–10.5)
nRBC: 0 % (ref 0.0–0.2)

## 2021-10-16 LAB — BASIC METABOLIC PANEL
Anion gap: 7 (ref 5–15)
BUN: 10 mg/dL (ref 6–20)
CO2: 27 mmol/L (ref 22–32)
Calcium: 9.1 mg/dL (ref 8.9–10.3)
Chloride: 102 mmol/L (ref 98–111)
Creatinine, Ser: 1.13 mg/dL (ref 0.61–1.24)
GFR, Estimated: >60 mL/min (ref >60)
Glucose, Bld: 115 mg/dL — ABNORMAL HIGH (ref 70–99)
Potassium: 4 mmol/L (ref 3.5–5.1)
Sodium: 136 mmol/L (ref 135–145)

## 2021-10-16 LAB — RESP PANEL BY RT-PCR (FLU A&B, COVID) ARPGX2
Influenza A by PCR: NEGATIVE
Influenza B by PCR: NEGATIVE
SARS Coronavirus 2 by RT PCR: NEGATIVE

## 2021-10-16 LAB — HEPATIC FUNCTION PANEL
ALT: 23 U/L (ref 0–44)
AST: 17 U/L (ref 15–41)
Albumin: 2.8 g/dL — ABNORMAL LOW (ref 3.5–5.0)
Alkaline Phosphatase: 57 U/L (ref 38–126)
Bilirubin, Direct: 0.3 mg/dL — ABNORMAL HIGH (ref 0.0–0.2)
Indirect Bilirubin: 1 mg/dL — ABNORMAL HIGH (ref 0.3–0.9)
Total Bilirubin: 1.3 mg/dL — ABNORMAL HIGH (ref 0.3–1.2)
Total Protein: 6.4 g/dL — ABNORMAL LOW (ref 6.5–8.1)

## 2021-10-16 LAB — BRAIN NATRIURETIC PEPTIDE: B Natriuretic Peptide: 82.7 pg/mL (ref 0.0–100.0)

## 2021-10-16 LAB — PROTIME-INR
INR: 1.2 (ref 0.8–1.2)
Prothrombin Time: 15.2 seconds (ref 11.4–15.2)

## 2021-10-16 LAB — APTT: aPTT: 40 seconds — ABNORMAL HIGH (ref 24–36)

## 2021-10-16 LAB — TROPONIN I (HIGH SENSITIVITY)
Troponin I (High Sensitivity): 7 ng/L (ref <18)
Troponin I (High Sensitivity): 7 ng/L (ref <18)

## 2021-10-16 LAB — LACTIC ACID, PLASMA: Lactic Acid, Venous: 1.2 mmol/L (ref 0.5–1.9)

## 2021-10-16 MED ORDER — DIPHENHYDRAMINE HCL 50 MG/ML IJ SOLN
25.0000 mg | Freq: Once | INTRAMUSCULAR | Status: AC
Start: 2021-10-16 — End: 2021-10-16
  Administered 2021-10-16: 25 mg via INTRAVENOUS
  Filled 2021-10-16: qty 1

## 2021-10-16 MED ORDER — FENTANYL CITRATE PF 50 MCG/ML IJ SOSY
50.0000 ug | PREFILLED_SYRINGE | Freq: Once | INTRAMUSCULAR | Status: AC
  Administered 2021-10-16: 50 ug via INTRAVENOUS
  Filled 2021-10-16: qty 1

## 2021-10-16 MED ORDER — LABETALOL HCL 5 MG/ML IV SOLN
10.0000 mg | INTRAVENOUS | Status: DC | PRN
  Administered 2021-10-17 – 2021-10-18 (×4): 10 mg via INTRAVENOUS
  Filled 2021-10-16 (×3): qty 4

## 2021-10-16 MED ORDER — LACTATED RINGERS IV BOLUS (SEPSIS)
1000.0000 mL | Freq: Once | INTRAVENOUS | Status: AC
  Administered 2021-10-16: 1000 mL via INTRAVENOUS

## 2021-10-16 MED ORDER — SENNOSIDES-DOCUSATE SODIUM 8.6-50 MG PO TABS: 1.0000 | ORAL_TABLET | Freq: Every evening | ORAL | Status: DC | PRN

## 2021-10-16 MED ORDER — LACTATED RINGERS IV SOLN: INTRAVENOUS | Status: AC

## 2021-10-16 MED ORDER — SODIUM CHLORIDE 0.9 % IV SOLN
500.0000 mg | INTRAVENOUS | Status: DC
  Administered 2021-10-16: 500 mg via INTRAVENOUS
  Filled 2021-10-16 (×2): qty 5

## 2021-10-16 MED ORDER — ONDANSETRON HCL 4 MG PO TABS: 4.0000 mg | ORAL_TABLET | Freq: Four times a day (QID) | ORAL | Status: DC | PRN

## 2021-10-16 MED ORDER — PIPERACILLIN-TAZOBACTAM 3.375 G IVPB
3.3750 g | Freq: Once | INTRAVENOUS | Status: AC
  Administered 2021-10-16: 3.375 g via INTRAVENOUS
  Filled 2021-10-16: qty 50

## 2021-10-16 MED ORDER — KETOROLAC TROMETHAMINE 15 MG/ML IJ SOLN
15.0000 mg | Freq: Once | INTRAMUSCULAR | Status: AC
  Administered 2021-10-16: 15 mg via INTRAVENOUS
  Filled 2021-10-16: qty 1

## 2021-10-16 MED ORDER — ONDANSETRON HCL 4 MG/2ML IJ SOLN
2.0000 mg | Freq: Once | INTRAMUSCULAR | Status: AC
  Administered 2021-10-16: 2 mg via INTRAVENOUS
  Filled 2021-10-16: qty 2

## 2021-10-16 MED ORDER — ACETAMINOPHEN 325 MG PO TABS
650.0000 mg | ORAL_TABLET | Freq: Four times a day (QID) | ORAL | Status: DC | PRN
  Administered 2021-10-16 – 2021-10-25 (×5): 650 mg via ORAL
  Filled 2021-10-16 (×5): qty 2

## 2021-10-16 MED ORDER — PROCHLORPERAZINE EDISYLATE 10 MG/2ML IJ SOLN
10.0000 mg | Freq: Once | INTRAMUSCULAR | Status: AC
  Administered 2021-10-16: 10 mg via INTRAVENOUS
  Filled 2021-10-16: qty 2

## 2021-10-16 MED ORDER — ACETAMINOPHEN 500 MG PO TABS
500.0000 mg | ORAL_TABLET | Freq: Once | ORAL | Status: AC
  Administered 2021-10-16: 500 mg via ORAL
  Filled 2021-10-16: qty 1

## 2021-10-16 MED ORDER — LACTATED RINGERS IV SOLN: INTRAVENOUS | Status: DC

## 2021-10-16 MED ORDER — SODIUM CHLORIDE 0.9 % IV BOLUS
1000.0000 mL | Freq: Once | INTRAVENOUS | Status: AC
  Administered 2021-10-16: 1000 mL via INTRAVENOUS

## 2021-10-16 MED ORDER — IOHEXOL 350 MG/ML SOLN
100.0000 mL | Freq: Once | INTRAVENOUS | Status: AC | PRN
  Administered 2021-10-16: 100 mL via INTRAVENOUS

## 2021-10-16 MED ORDER — ONDANSETRON HCL 4 MG/2ML IJ SOLN: 4.0000 mg | Freq: Four times a day (QID) | INTRAMUSCULAR | Status: DC | PRN

## 2021-10-16 MED ORDER — GUAIFENESIN ER 600 MG PO TB12
600.0000 mg | ORAL_TABLET | Freq: Two times a day (BID) | ORAL | Status: DC
  Administered 2021-10-16 – 2021-10-20 (×8): 600 mg via ORAL
  Filled 2021-10-16 (×8): qty 1

## 2021-10-16 MED ORDER — HEPARIN SODIUM (PORCINE) 5000 UNIT/ML IJ SOLN
5000.0000 [IU] | Freq: Three times a day (TID) | INTRAMUSCULAR | Status: DC
  Administered 2021-10-16 – 2021-10-17 (×2): 5000 [IU] via SUBCUTANEOUS
  Filled 2021-10-16 (×3): qty 1

## 2021-10-16 MED ORDER — MORPHINE SULFATE (PF) 4 MG/ML IV SOLN
4.0000 mg | Freq: Once | INTRAVENOUS | Status: AC
  Administered 2021-10-16: 4 mg via INTRAVENOUS
  Filled 2021-10-16: qty 1

## 2021-10-16 MED ORDER — PIPERACILLIN-TAZOBACTAM 3.375 G IVPB
3.3750 g | Freq: Three times a day (TID) | INTRAVENOUS | Status: DC
  Administered 2021-10-16 – 2021-10-20 (×11): 3.375 g via INTRAVENOUS
  Filled 2021-10-16 (×11): qty 50

## 2021-10-16 MED ORDER — SODIUM CHLORIDE 0.9 % IV SOLN
2.0000 g | INTRAVENOUS | Status: DC
  Administered 2021-10-16: 2 g via INTRAVENOUS
  Filled 2021-10-16: qty 20

## 2021-10-16 MED ORDER — LACTATED RINGERS IV BOLUS
1000.0000 mL | Freq: Once | INTRAVENOUS | Status: AC
  Administered 2021-10-16: 1000 mL via INTRAVENOUS

## 2021-10-16 MED ORDER — LACTATED RINGERS IV BOLUS (SEPSIS): 400.0000 mL | Freq: Once | INTRAVENOUS | Status: DC

## 2021-10-16 NOTE — ED Triage Notes (Signed)
Reported was seen on the 16th and was diagnosed with PNA; here today for progressing symptoms;

## 2021-10-16 NOTE — ED Notes (Signed)
EDP aware of increased HR and sustaining high BP;

## 2021-10-16 NOTE — ED Provider Notes (Signed)
[facility] EMERGENCY DEPARTMENT Provider Note   CSN: [id number] Arrival date & time: 10/16/21  2025     History  Chief Complaint  Patient presents with   Shortness of Breath    Todd Pena is a 36 y.o. male presented to the ER for evaluation of shortness of breath hemoptysis and pleuritic chest pain ongoing x1 week.  Patient reports initially he was experiencing fatigue and chills.  He was seen in the emergency department 3 days ago, diagnosis of pneumonia prescribed doxycycline.  Patient reports he has been taking his medications as prescribed but has been improving.  He reports he continued right-sided chest pain worse with deep breath and with cough, pain does not radiate it is sharp in nature and moderate in intensity.  Patient reports bloody sputum production over the past few days.  Patient reports this morning he had 1 episode of nonbloody/nonbilious emesis after taking his antibiotic.  Patient denies abdominal pain, extremity swelling/color change, history of blood clot, recent surgery/immobilization, exogenous hormone use or any additional concerns. HPI     Home Medications Prior to Admission medications   Medication Sig Start Date End Date Taking? Authorizing Provider  amLODipine (NORVASC) 5 MG tablet Take 1 tablet (5 mg total) by mouth daily. 12/31/18 01/30/19  Long, Arlyss Repress, MD  amoxicillin (AMOXIL) 500 MG capsule Take 1 capsule (500 mg total) by mouth 3 (three) times daily. 01/17/19   Molpus, John, MD  benzonatate (TESSALON) 100 MG capsule Take 1 capsule (100 mg total) by mouth every 8 (eight) hours. 10/13/21   Melene Plan, DO  doxycycline (VIBRAMYCIN) 100 MG capsule Take 1 capsule (100 mg total) by mouth 2 (two) times daily. One po bid x 7 days 10/13/21   Melene Plan, DO  HYDROcodone-acetaminophen (NORCO) 5-325 MG tablet Take 1 tablet by mouth every 4 (four) hours as needed for severe pain. 01/17/19   Molpus, John, MD  ibuprofen (ADVIL) 800 MG tablet Take 1  tablet (800 mg total) by mouth every 8 (eight) hours as needed for moderate pain. 11/11/18   Long, Arlyss Repress, MD      Allergies    Patient has no known allergies.    Review of Systems   Review of Systems Ten systems are reviewed and are negative for acute change except as noted in the HPI  Physical Exam Updated Vital Signs BP (!) 173/111   Pulse (!) 118   Temp 99.1 F (37.3 C) (Oral)   Resp (!) 27   Ht 5\' 11"  (1.803 m)   Wt 127 kg   SpO2 92%   BMI 39.05 kg/m  Physical Exam Constitutional:      General: He is not in acute distress.    Appearance: Normal appearance. He is well-developed. He is obese. He is not ill-appearing or diaphoretic.  HENT:     Head: Normocephalic and atraumatic.  Eyes:     General: Vision grossly intact. Gaze aligned appropriately.     Pupils: Pupils are equal, round, and reactive to light.  Neck:     Trachea: Trachea and phonation normal.  Pulmonary:     Effort: Pulmonary effort is normal. Tachypnea present. No respiratory distress.     Breath sounds: Normal air entry. Examination of the right-middle field reveals decreased breath sounds. Examination of the right-lower field reveals decreased breath sounds. Decreased breath sounds present.  Chest:     Chest wall: No tenderness.  Abdominal:     General: There is no distension.  Palpations: Abdomen is soft.     Tenderness: There is no abdominal tenderness. There is no guarding or rebound.  Musculoskeletal:        General: Normal range of motion.     Cervical back: Normal range of motion.  Skin:    General: Skin is warm and dry.  Neurological:     Mental Status: He is alert.     GCS: GCS eye subscore is 4. GCS verbal subscore is 5. GCS motor subscore is 6.     Comments: Speech is clear and goal oriented, follows commands Major Cranial nerves without deficit, no facial droop Moves extremities without ataxia, coordination intact  Psychiatric:        Behavior: Behavior normal.     ED Results  / Procedures / Treatments   Labs (all labs ordered are listed, but only abnormal results are displayed) Labs Reviewed  CBC WITH DIFFERENTIAL/PLATELET - Abnormal; Notable for the following components:      Result Value   WBC 19.9 (*)    Platelets 729 (*)    Neutro Abs 15.6 (*)    Monocytes Absolute 2.1 (*)    Abs Immature Granulocytes 0.12 (*)    All other components within normal limits  BASIC METABOLIC PANEL - Abnormal; Notable for the following components:   Glucose, Bld 115 (*)    All other components within normal limits  APTT - Abnormal; Notable for the following components:   aPTT 40 (*)    All other components within normal limits  HEPATIC FUNCTION PANEL - Abnormal; Notable for the following components:   Total Protein 6.4 (*)    Albumin 2.8 (*)    Total Bilirubin 1.3 (*)    Bilirubin, Direct 0.3 (*)    Indirect Bilirubin 1.0 (*)    All other components within normal limits  RESP PANEL BY RT-PCR (FLU A&B, COVID) ARPGX2  CULTURE, BLOOD (ROUTINE X 2)  CULTURE, BLOOD (ROUTINE X 2)  LACTIC ACID, PLASMA  PROTIME-INR  URINALYSIS, ROUTINE W REFLEX MICROSCOPIC  BRAIN NATRIURETIC PEPTIDE  TROPONIN I (HIGH SENSITIVITY)  TROPONIN I (HIGH SENSITIVITY)    EKG EKG Interpretation  Date/Time:  Saturday October 16 2021 09:16:27 EDT Ventricular Rate:  102 PR Interval:  154 QRS Duration: 89 QT Interval:  342 QTC Calculation: 446 R Axis:   38 Text Interpretation: Sinus tachycardia Since prior ECG< rate has increased Confirmed by Alvira Monday ([id number]) on 10/16/2021 9:42:25 AM  Radiology CT Angio Chest PE W and/or Wo Contrast  Result Date: 10/16/2021 CLINICAL DATA:  Pulmonary embolism suspected, recent diagnosis of pneumonia EXAM: CT ANGIOGRAPHY CHEST WITH CONTRAST TECHNIQUE: Multidetector CT imaging of the chest was performed using the standard protocol during bolus administration of intravenous contrast. Multiplanar CT image reconstructions and MIPs were obtained to evaluate  the vascular anatomy. RADIATION DOSE REDUCTION: This exam was performed according to the departmental dose-optimization program which includes automated exposure control, adjustment of the mA and/or kV according to patient size and/or use of iterative reconstruction technique. CONTRAST:  OMNIPAQUE IOHEXOL 350 MG/ML SOLN COMPARISON:  None Available. FINDINGS: Cardiovascular: Examination for pulmonary embolism is somewhat limited by marginal contrast bolus and breath motion artifact, particularly in the lung bases. Within this limitation, no evidence of pulmonary embolism through the proximal segmental pulmonary arterial level. Normal heart size. No pericardial effusion. Mediastinum/Nodes: No enlarged mediastinal, hilar, or axillary lymph nodes. Thyroid gland, trachea, and esophagus demonstrate no significant findings. Lungs/Pleura: Moderate, loculated appearing right pleural effusion and trace left pleural effusion.  Extensive associated atelectasis or consolidation, particularly of the right lung base, with a somewhat more focal opacity of the superior segment right lower lobe (series 5, image 42). Upper Abdomen: No acute abnormality. Musculoskeletal: No chest wall abnormality. No acute osseous findings. Review of the MIP images confirms the above findings. IMPRESSION: 1. Examination for pulmonary embolism is somewhat limited by marginal contrast bolus and breath motion artifact, particularly in the lung bases. Within this limitation, no evidence of pulmonary embolism through the proximal segmental pulmonary arterial level. 2. Moderate, loculated appearing right pleural effusion and trace left pleural effusion. Extensive associated atelectasis or consolidation, particularly of the right lung base, with a somewhat more focal opacity of the superior segment right lower lobe. Findings are consistent with multifocal infection and/or aspiration. Electronically Signed   By: Jearld Lesch M.D.   On: 10/16/2021 13:12    DG Chest 2 View  Result Date: 10/16/2021 CLINICAL DATA:  Progressing pneumonia symptoms. EXAM: CHEST - 2 VIEW COMPARISON:  10/13/2021 and older studies. FINDINGS: Cardiac silhouette is normal in size. No mediastinal or hilar masses or evidence of adenopathy. Lung volumes are low. There is bilateral lung base opacity mostly obscuring the left and obscuring the right hemidiaphragms, similar on the left but increased on the right compared to the prior exam. Mid to upper lungs remain clear. No convincing pleural effusion.  No pneumothorax. Skeletal structures are intact. IMPRESSION: 1. Bilateral lung base opacities, increased on the right and stable on the left compared to the most recent prior chest radiograph, consistent with pneumonia, atelectasis or a combination. Findings are accentuated by low lung volumes. Electronically Signed   By: Amie Portland M.D.   On: 10/16/2021 09:35    Procedures .Critical Care  Performed by: Bill Salinas, PA-C Authorized by: Bill Salinas, PA-C   Critical care provider statement:    Critical care time (minutes):  30   Critical care was necessary to treat or prevent imminent or life-threatening deterioration of the following conditions:  Sepsis   Critical care was time spent personally by me on the following activities:  Development of treatment plan with patient or surrogate, discussions with consultants, evaluation of patient's response to treatment, examination of patient, ordering and review of laboratory studies, ordering and review of radiographic studies, ordering and performing treatments and interventions, pulse oximetry, re-evaluation of patient's condition and review of old charts   Care discussed with: admitting provider     Care discussed with comment:  Case also discussed with attending physician Dr. Dalene Seltzer.     Medications Ordered in ED Medications  lactated ringers infusion ( Intravenous Not Given 10/16/21 1643)  cefTRIAXone  (ROCEPHIN) 2 g in sodium chloride 0.9 % 100 mL IVPB (0 g Intravenous Stopped 10/16/21 1409)  azithromycin (ZITHROMAX) 500 mg in sodium chloride 0.9 % 250 mL IVPB (0 mg Intravenous Stopped 10/16/21 1408)  lactated ringers bolus 1,000 mL (0 mLs Intravenous Stopped 10/16/21 1409)    And  lactated ringers bolus 400 mL (400 mLs Intravenous Not Given 10/16/21 1643)  sodium chloride 0.9 % bolus 1,000 mL (0 mLs Intravenous Stopped 10/16/21 1408)  iohexol (OMNIPAQUE) 350 MG/ML injection 100 mL (100 mLs Intravenous Contrast Given 10/16/21 1044)  morphine (PF) 4 MG/ML injection 4 mg (4 mg Intravenous Given 10/16/21 1138)  ondansetron (ZOFRAN) injection 2 mg (2 mg Intravenous Given 10/16/21 1137)  piperacillin-tazobactam (ZOSYN) IVPB 3.375 g (0 g Intravenous Stopped 10/16/21 1735)  acetaminophen (TYLENOL) tablet 500 mg (500 mg Oral Given 10/16/21 1642)  fentaNYL (SUBLIMAZE) injection 50 mcg (50 mcg Intravenous Given 10/16/21 1703)    ED Course/ Medical Decision Making/ A&P Clinical Course as of 10/16/21 1805  Sat Oct 16, 2021  1137 ED EKG I have personally reviewed and interpreted patient's twelve-lead EKG.  It shows sinus tachycardia I do not appreciate any obvious acute ischemic changes. [BM]  1137 CBC with Differential(!) CBC shows leukocytosis of 19.9 which reinforces suspected diagnosis of pneumonia.  Heme globin of 14.8, doubt symptomatic anemia as cause of shortness of breath.   [BM]  1137 Basic metabolic panel(!) BMP shows no emergent Electra derangement, AKI or gap. [BM]  1138 Protime-INR INR within normal limits. [BM]  1138 Hepatic function panel(!) LFTs without emergent elevations. [BM]  1138 Lactic acid, plasma Lactic acid 1.2, reassuring [BM]  1138 Urinalysis, Routine w reflex microscopic Urinalysis shows no evidence for infection [BM]  1138 DG Chest 2 View I have personally reviewed and interpreted patient's two-view chest x-ray.  This appears to show worsening opacities when compared to x-ray  10/13/2021.  No evidence of PTX. [BM]  1251 Patient reassessed he is sitting up in bed with nonproductive cough.  Remains slightly tachycardic, SPO2 93% on room air.  He reports he is feeling better since receiving IV medications here.  Patient cannot tolerate CT scan the first time, rad tech to coming to try again. [BM]  1326 Resp Panel by RT-PCR (Flu A&B, Covid) Anterior Nasal Swab COVID/influenza panel negative. [BM]  1326 CT Angio Chest PE W and/or Wo Contrast I personally reviewed and interpreted patient's CT angio PE study.  I agree with radiologist that patient appears to have pleural effusions, do not appreciate any obvious saddle embolus. [BM]  1347 Consult with hospitalist Dr. [place], advises to DC ceftriaxone and begin patient on Zosyn 3.375 g at this time.  Patient was accepted for admission. [BM]  1500 Patient reassessed no acute distress.  SPO2 100% on 2 L nasal cannula.  Remains slightly tachycardic.  Awaiting transfer. [BM]  1651 Patient reassessed he is sitting in a chair, no acute distress remains tachycardic rate 120, pulse ox 99% on 2 L nasal cannula.  Blood pressure elevated 170/120, he is not taken his daily blood pressure medication today.  Tylenol was ordered for headache earlier.  I have ordered 50 mcg fentanyl for his continued back pain he is experiencing.  Discussed case with Dr. Dalene Seltzer,, will avoid antihypertensives at this point i given possible compensation inciting symptoms. [BM]    Clinical Course User Index [BM] Elizabeth Palau                           Medical Decision Making 36 year old male history of hypertension obesity presented for worsening shortness of breath, pleuritic chest pain and hemoptysis.  Symptoms started last week initially had fatigue and chills as well.  He was diagnosed with pneumonia 3 days ago and started on doxycycline.  He reports symptoms have continued to worsen since that time.  On initial examination patient is in no acute  distress he is ill-appearing.  Vital signs show tachycardia rate around 110 bpm, slightly tachypneic around 25 breaths/min, SPO2 95% on room air.  Will obtain labs including sepsis screening labs.  Considering patient's hemoptysis and worsening chest pain PE was considered, shared decision making made with patient and risk versus benefits of CT imaging were discussed.  Patient stated understanding and consented to obtain CT angio of the chest to evaluate for  pulmonary embolism.  Patient is stable at this time.  We will continue to monitor.  Anticipate admission for failed outpatient treatment of community-acquired pneumonia.  Differential at this time includes but not limited to community-acquired pneumonia, COVID, flu, pulmonary embolism, CHF.  Amount and/or Complexity of Data Reviewed External Data Reviewed: radiology and notes.    Details: I reviewed patient's prior ER visit along with his checks x-ray and prior provider note. Labs: ordered. Decision-making details documented in ED Course. Radiology: ordered. Decision-making details documented in ED Course. ECG/medicine tests:  Decision-making details documented in ED Course.  Risk OTC drugs. Prescription drug management. Decision regarding hospitalization. Risk Details: Patient was reassessed multiple times during this visit, he remains short of breath reports symptoms are somewhat improved.  CT angio does not show any evidence for PE but does show loculated pleural effusions on the right side and trace left pleural effusion along with extensive atelectasis/consolidation, likely multifocal infection.  Patient meets SIRS/sepsis criteria but does not appear to be in shock.  Patient received around 2 L IV fluids at this point, I asked that the nurse hold the remainder of IV fluids, he has a normal lactic acid, no hypotension.  Plan of care is admission to medicine service for further treatment, patient is receiving IV antibiotics.  Patient is  agreeable to plan of care.  Case was discussed with attending physician Dr. Dalene SeltzerSchlossman who agrees with plan of care.      Note: Portions of this report may have been transcribed using voice recognition software. Every effort was made to ensure accuracy; however, inadvertent computerized transcription errors may still be present.         Final Clinical Impression(s) / ED Diagnoses Final diagnoses:  Sepsis without acute organ dysfunction, due to unspecified organism [facility](HCC)  Multifocal pneumonia    Rx / DC Orders ED Discharge Orders     None         Elizabeth PalauMorelli, Isabel Ardila A, PA-C 10/16/21 1805    Alvira MondaySchlossman, Erin, MD 10/17/21 2238

## 2021-10-16 NOTE — H&P (Signed)
History and Physical    [name]:[id number] DOB: 12-29-1985 DOA: 10/16/2021  PCP: Patient, No Pcp Per  Patient coming from: Home  I have personally briefly reviewed patient's old medical records in [facility]  Chief Complaint: Pneumonia  HPI: Todd Pena is a 36 y.o. male with medical history significant for hypertension who presented to the ED for evaluation of progressive symptoms after recent pneumonia diagnosis.  Patient reports feeling sick initially 1 week ago.  He has been having exertional dyspnea and cough occasionally productive of bloody sputum.  He has had subjective fevers, chills, diaphoresis, and fatigue.  Initially was seen in the ED on 10/13/2021.  CXR showed a right lower lobe pneumonia.  He appeared otherwise stable and was discharged to home with a course of doxycycline.  He says he he has had no improvement despite taking the antibiotic.  He had 1 episode of emesis yesterday.  He reports frequent bowel movements but denies any watery diarrhea.  He reports good urine output.  He has not seen any swelling in his legs or any cramping/pain in his calves.  He denies any tobacco, alcohol, illicit drug use.  He says he was previously taking medications for high blood pressure but has not for quite some time now.  ED Course  Labs/Imaging on admission: I have personally reviewed following labs and imaging studies.  Initial vitals showed BP 192/126, pulse 109, RR 29, temp 98.2 F, SPO2 95% on room air.  SPO2 dropped to 91% and patient was placed on 3 L O2 via [place].  Labs significant for WBC 19.9, hemoglobin 14.8, platelets 729,000, sodium 136, potassium 4.0, bicarb 27, BUN 10, creatinine 1.13, serum glucose 115, lactic acid 1.2, troponin 7x2, BNP 82.7.  Blood cultures in process.  SARS-CoV-2 and influenza PCR negative.  Urinalysis negative for UTI.  2 view chest x-ray showed bilateral lung base opacities, increased on the right.  CTA chest PE study was  obtained.  Examination for PE was limited but otherwise no evidence of pulmonary embolism.  Moderate loculated appearing right pleural effusion and trace left pleural effusion seen with extensive associated atelectasis/consolidation of the right lung base.  Patient initially given 2 L normal saline, IV ceftriaxone and azithromycin.  Due to aspiration he was then given a dose of Zosyn.  The hospitalist service was consulted to admit for further evaluation and management.  Review of Systems: All systems reviewed and are negative except as documented in history of present illness above.   Past Medical History:  Diagnosis Date   Hypertension     Past Surgical History:  Procedure Laterality Date   FRACTURE SURGERY      Social History:  reports that he has quit smoking. His smoking use included cigarettes. He has never used smokeless tobacco. He reports that he does not drink alcohol and does not use drugs.  No Known Allergies  History reviewed. No pertinent family history.   Prior to Admission medications   Medication Sig Start Date End Date Taking? Authorizing Provider  amLODipine (NORVASC) 5 MG tablet Take 1 tablet (5 mg total) by mouth daily. 12/31/18 01/30/19  Long, Arlyss Repress, MD  amoxicillin (AMOXIL) 500 MG capsule Take 1 capsule (500 mg total) by mouth 3 (three) times daily. 01/17/19   Molpus, John, MD  benzonatate (TESSALON) 100 MG capsule Take 1 capsule (100 mg total) by mouth every 8 (eight) hours. 10/13/21   Melene Plan, DO  doxycycline (VIBRAMYCIN) 100 MG capsule Take 1 capsule (  100 mg total) by mouth 2 (two) times daily. One po bid x 7 days 10/13/21   Melene Plan, DO  HYDROcodone-acetaminophen (NORCO) 5-325 MG tablet Take 1 tablet by mouth every 4 (four) hours as needed for severe pain. 01/17/19   Molpus, John, MD  ibuprofen (ADVIL) 800 MG tablet Take 1 tablet (800 mg total) by mouth every 8 (eight) hours as needed for moderate pain. 11/11/18   Maia Plan, MD    Physical  Exam: Vitals:   10/16/21 1759 10/16/21 1843 10/16/21 1846 10/16/21 2000  BP:   (!) 181/123 (!) 179/128  Pulse:    (!) 114  Resp:    18  Temp: 99.1 F (37.3 C)  99.7 F (37.6 C) 99.1 F (37.3 C)  TempSrc: Oral   Oral  SpO2:   97% 94%  Weight:  130.9 kg    Height:  5\' 11"  (1.803 m)     Constitutional: Sitting up in bed, appears fatigued but otherwise in NAD Eyes: EOMI, lids and conjunctivae normal ENMT: Mucous membranes are moist. Posterior pharynx clear of any exudate or lesions.Normal dentition.  Neck: normal, supple, no masses. Respiratory: Diminished breath sounds lower lung fields. Normal respiratory effort. No accessory muscle use.  Cardiovascular: Tachycardic, no murmurs / rubs / gallops. No extremity edema. 2+ pedal pulses. Abdomen: no tenderness, no masses palpated. Musculoskeletal: no clubbing / cyanosis. No joint deformity upper and lower extremities. Good ROM, no contractures. Normal muscle tone.  Skin: Diaphoretic.  No rashes, lesions, ulcers. No induration Neurologic:  Sensation intact. Strength 5/5 in all 4.  Psychiatric:  Alert and oriented x 3.  EKG: Personally reviewed. Sinus tachycardia, rate 102, no acute ischemic changes.  Rate is faster when compared to prior.  Assessment/Plan Principal Problem:   Multifocal pneumonia Active Problems:   Hypertension   Loculated right pleural effusion   Todd Pena is a 36 y.o. male with medical history significant for hypertension who is admitted with multifocal pneumonia most prominently in the right lower lobe with associated loculated pleural effusion.  Assessment and Plan: * Multifocal pneumonia CT shows multifocal pneumonia, greatest in right lower lobe with associated loculated pleural effusion.  Failed outpatient antibiotic with doxycycline. -Continue IV Zosyn -Follow blood cultures, check strep pneumonia and Legionella urinary antigens -Continue supplemental oxygen as needed -Consider pulmonology consult  pending clinical progress on antibiotics  Hypertension BP elevated on admission.  He self discontinued antihypertensives a while ago. -IV labetalol as needed  DVT prophylaxis: heparin injection 5,000 Units Start: 10/16/21 2200 Code Status: Full code Family Communication: Discussed with patient, he has discussed with family Disposition Plan: From home and likely discharge to home pending clinical progress Consults called: None Severity of Illness: The appropriate patient status for this patient is INPATIENT. Inpatient status is judged to be reasonable and necessary in order to provide the required intensity of service to ensure the patient's safety. The patient's presenting symptoms, physical exam findings, and initial radiographic and laboratory data in the context of their chronic comorbidities is felt to place them at high risk for further clinical deterioration. Furthermore, it is not anticipated that the patient will be medically stable for discharge from the hospital within 2 midnights of admission.   * I certify that at the point of admission it is my clinical judgment that the patient will require inpatient hospital care spanning beyond 2 midnights from the point of admission due to high intensity of service, high risk for further deterioration and high frequency of surveillance  required.Darreld Mclean MD [facility]  If 7PM-7AM, please contact night-coverage [email or web address]  10/16/2021, 8:59 PM

## 2021-10-16 NOTE — Hospital Course (Signed)
Todd Pena is a 36 y.o. male with medical history significant for hypertension who is admitted with multifocal pneumonia most prominently in the right lower lobe with associated loculated pleural effusion.

## 2021-10-16 NOTE — Plan of Care (Signed)
Originating Facility: [facility] Requesting Physician/APP: Harlene Salts, PA  History: Todd Pena is a 36 year old male with past medical history significant for HTN, obesity who presented to med [facility] with progressive shortness of breath, hemoptysis and ongoing pleuritic chest pain over the last week.  Additionally with fatigue and chills.  He was recently seen and prescribed doxycycline for pneumonia but has not had any significant improvement.  On arrival to the ED, patient was noted to be tachypneic, tachycardic, and hypertensive.  Oxygen saturations low at 91 on room air.  Work-up revealing elevated WBC count of 19.9 and CT angiogram chest with moderate loculated appearing right pleural effusion with extensive consolidation right lung base and more focal opacity superior segment right lower lobe consistent with multifocal infection and concern for aspiration.  Patient was initially given 2 L IV fluid bolus, started on azithromycin and ceftriaxone.  Given concern for aspiration, recommended EDP started on Zosyn.  Patient is hemodynamically stable.   Plan of Care:  -- Accepted in transfer to [facility] telemetry bed. -- Will likely need pulmonology input regarding loculated pleural effusion, may need chest tube   [facility] will assume care on arrival to accepting facility. Until arrival, care as per EDP. However, [facility] available 24/7 for questions and assistance.   Please page [facility] Admits and Consults [phone]) as soon as the patient arrives to the hospital.   Todd Boehringer [place], DO

## 2021-10-16 NOTE — Assessment & Plan Note (Signed)
CT shows multifocal pneumonia, greatest in right lower lobe with associated loculated pleural effusion.  Failed outpatient antibiotic with doxycycline. -Continue IV Zosyn -Follow blood cultures, check strep pneumonia and Legionella urinary antigens -Continue supplemental oxygen as needed -Consider pulmonology consult pending clinical progress on antibiotics

## 2021-10-16 NOTE — Progress Notes (Signed)
   10/16/21 1846  Assess: MEWS Score  Temp 99.7 F (37.6 C)  BP (!) 181/123  MAP (mmHg) 140  SpO2 97 %  O2 Device Nasal Cannula  O2 Flow Rate (L/min) 3 L/min  Assess: MEWS Score  MEWS Temp 0  MEWS Systolic 0  MEWS Pulse 2  MEWS RR 2  MEWS LOC 0  MEWS Score 4  MEWS Score Color Red  Assess: if the MEWS score is Yellow or Red  Were vital signs taken at a resting state? Yes  Focused Assessment No change from prior assessment  Does the patient meet 2 or more of the SIRS criteria? Yes  Does the patient have a confirmed or suspected source of infection? Yes  Provider and Rapid Response Notified? Yes (Rapid response RN not notified)  MEWS guidelines implemented *See Row Information* Yes  Treat  MEWS Interventions Escalated (See documentation below)  Pain Scale 0-10  Pain Score 8  Pain Type Acute pain  Pain Location Head  Pain Descriptors / Indicators Headache  Patients Stated Pain Goal 0  Pain Intervention(s) Emotional support (new admit, waiting on orders)  Take Vital Signs  Increase Vital Sign Frequency  Red: Q 1hr X 4 then Q 4hr X 4, if remains red, continue Q 4hrs  Escalate  MEWS: Escalate Red: discuss with charge nurse/RN and provider, consider discussing with RRT  Notify: Charge Nurse/RN  Name of Charge Nurse/RN Notified Megan RN  Date Charge Nurse/RN Notified 10/16/21  Time Charge Nurse/RN Notified 1859  Notify: Provider  Provider Name/Title Terrilee Croak  Date Provider Notified 10/16/21  Time Provider Notified 1857  Method of Notification Page  Notification Reason  (Red Mews and pain complaint)  Assess: SIRS CRITERIA  SIRS Temperature  0  SIRS Pulse 1  SIRS Respirations  1  SIRS WBC 1  SIRS Score Sum  3

## 2021-10-16 NOTE — ED Notes (Signed)
Patient transported to X-ray 

## 2021-10-16 NOTE — Progress Notes (Signed)
Pharmacy Antibiotic Note  Todd Pena is a 36 y.o. male who presented to the ED on 10/13/21 with c/o back pain, cough and SOB. CXR on 8/16 showed findings with concern for PNA.  He was discharged on doxycycline for PNA.  He presented back to the ED  on 10/16/2021 with SOB and hemoptysis.  Chest CT on 8/19 showed pleural effusions and findings that are consistent with "multifocal infection and/or aspiration."  Pharmacy has been consulted to dose zosyn for suspected asp PNA.    Plan: - zosyn 3.375 gm IV q8h (infuse over 4 hrs) - With good renal function, pharmacy will sign off for abx consult.  Reconsult [place] if need further assistance.  __________________________________ Height: 5\' 11"  (180.3 cm) Weight: 130.9 kg (288 lb 9.6 oz) IBW/kg (Calculated) : 75.3  Temp (24hrs), Avg:98.9 F (37.2 C), Min:98.2 F (36.8 C), Max:99.7 F (37.6 C)  Recent Labs  Lab 10/16/21 0946 10/16/21 0956  WBC 19.9*  --   CREATININE 1.13  --   LATICACIDVEN  --  1.2    Estimated Creatinine Clearance: 125.8 mL/min (by C-G formula based on SCr of 1.13 mg/dL).    No Known Allergies   Thank you for allowing pharmacy to be a part of this patient's care.  10/18/21 10/16/2021 7:05 PM

## 2021-10-16 NOTE — Assessment & Plan Note (Signed)
BP elevated on admission.  He self discontinued antihypertensives a while ago. -IV labetalol as needed

## 2021-10-17 DIAGNOSIS — J189 Pneumonia, unspecified organism: Secondary | ICD-10-CM

## 2021-10-17 LAB — CBC
HCT: 43.9 % (ref 39.0–52.0)
Hemoglobin: 14.5 g/dL (ref 13.0–17.0)
MCH: 29.5 pg (ref 26.0–34.0)
MCHC: 33 g/dL (ref 30.0–36.0)
MCV: 89.2 fL (ref 80.0–100.0)
Platelets: 714 10*3/uL — ABNORMAL HIGH (ref 150–400)
RBC: 4.92 MIL/uL (ref 4.22–5.81)
RDW: 12.2 % (ref 11.5–15.5)
WBC: 21.4 10*3/uL — ABNORMAL HIGH (ref 4.0–10.5)
nRBC: 0 % (ref 0.0–0.2)

## 2021-10-17 LAB — BASIC METABOLIC PANEL
Anion gap: 8 (ref 5–15)
BUN: 13 mg/dL (ref 6–20)
CO2: 26 mmol/L (ref 22–32)
Calcium: 8.9 mg/dL (ref 8.9–10.3)
Chloride: 101 mmol/L (ref 98–111)
Creatinine, Ser: 1.17 mg/dL (ref 0.61–1.24)
GFR, Estimated: >60 mL/min (ref >60)
Glucose, Bld: 129 mg/dL — ABNORMAL HIGH (ref 70–99)
Potassium: 4.1 mmol/L (ref 3.5–5.1)
Sodium: 135 mmol/L (ref 135–145)

## 2021-10-17 LAB — MRSA NEXT GEN BY PCR, NASAL: MRSA by PCR Next Gen: NOT DETECTED

## 2021-10-17 LAB — HIV ANTIBODY (ROUTINE TESTING W REFLEX): HIV Screen 4th Generation wRfx: NONREACTIVE

## 2021-10-17 MED ORDER — AMLODIPINE BESYLATE 5 MG PO TABS
5.0000 mg | ORAL_TABLET | Freq: Every day | ORAL | Status: DC
  Administered 2021-10-17: 5 mg via ORAL
  Filled 2021-10-17: qty 1

## 2021-10-17 MED ORDER — HYDROMORPHONE HCL 1 MG/ML IJ SOLN
1.0000 mg | INTRAMUSCULAR | Status: DC | PRN
  Administered 2021-10-17 – 2021-10-30 (×24): 1 mg via INTRAVENOUS
  Filled 2021-10-17 (×26): qty 1

## 2021-10-17 MED ORDER — OXYCODONE HCL 5 MG PO TABS
5.0000 mg | ORAL_TABLET | ORAL | Status: DC | PRN
  Administered 2021-10-17 – 2021-10-29 (×33): 5 mg via ORAL
  Filled 2021-10-17 (×35): qty 1

## 2021-10-17 MED ORDER — HYDROCODONE BIT-HOMATROP MBR 5-1.5 MG/5ML PO SOLN
5.0000 mL | Freq: Four times a day (QID) | ORAL | Status: DC | PRN
Start: 2021-10-17 — End: 2021-10-28
  Administered 2021-10-17 – 2021-10-26 (×7): 5 mL via ORAL
  Filled 2021-10-17 (×7): qty 5

## 2021-10-17 NOTE — Consult Note (Signed)
NAME:  Todd Pena, MRN:  [id number], DOB:  15-Feb-1986, LOS: 1 ADMISSION DATE:  10/16/2021, CONSULTATION DATE: 10/17/2021 REFERRING MD: Dr. [place], CHIEF COMPLAINT: Loculated effusion, multilobar pneumonia  History of Present Illness:  Patient admitted with worsening shortness of breath Recent diagnosis of pneumonia for which she was treated with a course of doxycycline, symptoms did progress leading to presentation to the emergency department Has had about a week of feeling unwell with exertional dyspnea cough, mild hemoptysis Subjective fevers chills diaphoresis Decreased exercise tolerance  Did not see any significant improvement in his symptoms despite completing course of antibiotics, there was associated diarrhea with this antibiotic use  Denies any chest pains or chest discomfort  Background history of hypertension  Pertinent  Medical History   Past Medical History:  Diagnosis Date   Hypertension     Significant Hospital Events: Including procedures, antibiotic start and stop dates in addition to other pertinent events   CT chest with loculated effusion, multilobar infiltrate  Interim History / Subjective:  Denies any pain or discomfort, feels slightly better  Objective   Blood pressure (!) 188/112, pulse (!) 118, temperature 99.6 F (37.6 C), temperature source Oral, resp. rate 20, height 5\' 11"  (1.803 m), weight 130 kg, SpO2 95 %.    FiO2 (%):  [28 %] 28 %   Intake/Output Summary (Last 24 hours) at 10/17/2021 0944 Last data filed at 10/17/2021 0300 Gross per 24 hour  Intake 2629.23 ml  Output 550 ml  Net 2079.23 ml   Filed Weights   10/16/21 0912 10/16/21 1843 10/17/21 0500  Weight: 127 kg 130.9 kg 130 kg    Examination: General: Middle-age, does not need to be in distress HENT: Moist oral mucosa Lungs: Decreased air movement right base Cardiovascular: S1-S2 appreciated Abdomen: Soft, bowel sounds appreciated Extremities: No clubbing, no  edema Neuro: Alert and oriented x3, nonfocal GU:   Chest x-ray reviewed CT scan of the chest reviewed by myself showing loculated effusion, multiple lobar infiltrate  Influenza PCR negative SARS negative  Resolved Hospital Problem list     Assessment & Plan:  Hypoxemic respiratory failure Respiratory failure secondary to multilobar pneumonia Hemoptysis related to pneumonia -Failed outpatient treatment with doxycycline  Loculated pleural effusion -Will need to sample the fluid -May require chest tube placement  Continue antibiotic therapy  Follow-up on cultures, strep pneumo and Legionella antigens  Continue oxygen supplementation  Treatment for his hypertension  Best Practice (right click and "Reselect all SmartList Selections" daily)   Diet/type: Regular consistency (see orders) DVT prophylaxis: prophylactic heparin  GI prophylaxis: N/A Lines: N/A Foley:  N/A Code Status:  full code Last date of multidisciplinary goals of care discussion [Per primary]  Labs   CBC: Recent Labs  Lab 10/16/21 0946 10/17/21 0456  WBC 19.9* 21.4*  NEUTROABS 15.6*  --   HGB 14.8 14.5  HCT 43.3 43.9  MCV 86.6 89.2  PLT 729* 714*    Basic Metabolic Panel: Recent Labs  Lab 10/16/21 0946 10/17/21 0456  NA 136 135  K 4.0 4.1  CL 102 101  CO2 27 26  GLUCOSE 115* 129*  BUN 10 13  CREATININE 1.13 1.17  CALCIUM 9.1 8.9   GFR: Estimated Creatinine Clearance: 121.2 mL/min (by C-G formula based on SCr of 1.17 mg/dL). Recent Labs  Lab 10/16/21 0946 10/16/21 0956 10/17/21 0456  WBC 19.9*  --  21.4*  LATICACIDVEN  --  1.2  --     Liver Function Tests: Recent Labs  Lab  10/16/21 0946  AST 17  ALT 23  ALKPHOS 57  BILITOT 1.3*  PROT 6.4*  ALBUMIN 2.8*   No results for input(s): "LIPASE", "AMYLASE" in the last 168 hours. No results for input(s): "AMMONIA" in the last 168 hours.  ABG    Component Value Date/Time   HCO3 28.9 (H) 12/24/2006 2329   TCO2 30  05/09/2009 1115     Coagulation Profile: Recent Labs  Lab 10/16/21 0946  INR 1.2    Cardiac Enzymes: No results for input(s): "CKTOTAL", "CKMB", "CKMBINDEX", "TROPONINI" in the last 168 hours.  HbA1C: No results found for: "HGBA1C"  CBG: No results for input(s): "GLUCAP" in the last 168 hours.  Review of Systems:   Denies any chest pains or chest discomfort  Past Medical History:  He,  has a past medical history of Hypertension.   Surgical History:   Past Surgical History:  Procedure Laterality Date   FRACTURE SURGERY       Social History:   reports that he has quit smoking. His smoking use included cigarettes. He has never used smokeless tobacco. He reports that he does not drink alcohol and does not use drugs.   Family History:  His family history is not on file.   Allergies No Known Allergies   Home Medications  Prior to Admission medications   Medication Sig Start Date End Date Taking? Authorizing Provider  benzonatate (TESSALON) 100 MG capsule Take 1 capsule (100 mg total) by mouth every 8 (eight) hours. 10/13/21  Yes Melene Plan, DO  doxycycline (VIBRAMYCIN) 100 MG capsule Take 1 capsule (100 mg total) by mouth 2 (two) times daily. One po bid x 7 days 10/13/21  Yes Melene Plan, DO  ibuprofen (ADVIL) 200 MG tablet Take 400 mg by mouth every 6 (six) hours as needed for moderate pain.   Yes [provider]  naproxen sodium (ALEVE) 220 MG tablet Take 440 mg by mouth 2 (two) times daily as needed (pain).   Yes [provider]  amLODipine (NORVASC) 5 MG tablet Take 1 tablet (5 mg total) by mouth daily. 12/31/18 01/30/19  Long, Arlyss Repress, MD  amoxicillin (AMOXIL) 500 MG capsule Take 1 capsule (500 mg total) by mouth 3 (three) times daily. Patient not taking: Reported on 10/16/2021 01/17/19   Molpus, John, MD  HYDROcodone-acetaminophen (NORCO) 5-325 MG tablet Take 1 tablet by mouth every 4 (four) hours as needed for severe pain. Patient not taking: Reported  on 10/16/2021 01/17/19   Molpus, Jonny Ruiz, MD  ibuprofen (ADVIL) 800 MG tablet Take 1 tablet (800 mg total) by mouth every 8 (eight) hours as needed for moderate pain. Patient not taking: Reported on 10/16/2021 11/11/18   Long, Arlyss Repress, MD    Virl Diamond, MD [facility] Pager: See Loretha Stapler

## 2021-10-17 NOTE — Progress Notes (Addendum)
PROGRESS NOTE    Todd Pena  YCX:[id number] DOB: [birth year] DOA: 10/16/2021 PCP: Patient, No Pcp Per    Brief Narrative:   [name] is a 36 y.o. male with past medical history significant for essential hypertension, morbid obesity presented to [facility] ED on 8/19 with progressive shortness of breath, cough, subjective fevers, chills, diaphoresis and fatigue.  Patient was recently seen in the ED on 8/16 with chest x-ray findings of a right lower lobe pneumonia and was discharged home with a course of doxycycline.  Patient reports no improvement despite compliance with the antibiotic.  Patient also reported 1 episode of emesis day prior with frequent bowel movements but denies any watery diarrhea.  Denies swelling in his legs, no cramping/pain in his cast.  Denies tobacco/alcohol or illicit drug use.  Reports that he was previously taking medications for high blood pressure but has not continued their use for some time now.  In the ED, temperature 98.2 F, HR 109, RR 22, BP 191/127, SPO2 91% on room air; and patient was placed on 2 L nasal cannula.  WBC 19.9, hemoglobin 14.8, platelets 729.  Sodium 136, potassium 4.0, chloride 102, CO2 27, glucose 115, BUN 10, creatinine 1.13.  AST 17, ALT 23, total bilirubin 1.3.  Lactic acid 1.2.  High sensitive troponin 7.  BNP 82.7.  Urinalysis unrevealing.  COVID-19 PCR negative.  Influenza A/B PCR negative.  Chest x-ray with bilateral lung opacities, increased on the right and stable on the left compared with most recent chest x-ray consistent with pneumonia.  CT angiogram chest PE no evidence of pulmonary embolism although limited study, moderate loculated appearing right pleural effusion and trace left pleural effusion, extensive associated atelectasis or consolidation particularly of the right lung base with more focal opacity in the superior segment right lower lobe consistent with multifocal infection and possible aspiration.  Patient was initially given  2 L NS, IV ceftriaxone and azithromycin.  Due to aspiration he was then given a dose of Zosyn.  TRH consulted for further evaluation and management of multifocal pneumonia, loculated right pleural effusion.  Assessment & Plan:   Sepsis, POA Multifocal pneumonia Patient representing to ED with progressive shortness of breath, productive cough despite outpatient doxycycline.  CT angiogram chest shows multifocal pneumonia, greatest in right lower lobe with associated loculated pleural effusion.  Patient was tachycardic, tachypneic, with SPO2 91% on room air with associated confusion.  WBC count elevated 19.9.  Seen by pulmonology for consideration of chest tube placement, per POC ultrasound 8/20 no good pocket for sampling of fluid and recommended continue IV antibiotics for now. --Pulmonology following, appreciate assistance --WBC 19.9>21.4 --Blood cultures x2: No growth less than 24 hours --Urine Legionella and strep pneumo antigen: Pending --Zosyn 3.375 g IV every 8 hours --Mucinex 600 mg p.o. twice daily --Supportive care --CBC daily  Acute metabolic encephalopathy, POA: Resolved Upon arrival to the ED, patient reported that he was confused.  Now started on empiric antibiotics for multifocal pneumonia and mental status appears now to be back at his typical baseline.  Essential hypertension, poorly controlled Blood pressure notably elevated 191/127 on admission.  Was previously on amlodipine but has been noncompliant. --Restart amlodipine 5 mg p.o. daily --Labetalol 10 mg IV every 4 hours as needed SBP >180 or DBP >110  Morbid obesity Body mass index is 39.97 kg/m.  Discussed with patient needs for aggressive lifestyle changes/weight loss as this complicates all facets of care.  Outpatient follow-up with PCP.    DVT  prophylaxis: Refusing chemical DVT prophylaxis    Code Status: Full Code Family Communication: No family present at bedside this morning  Disposition Plan:  Level of  care: Telemetry Status is: Inpatient Remains inpatient appropriate because: IV antibiotics    Consultants:  PCCM  Procedures:  None  Antimicrobials:  Azithromycin 8/19 - 8/19 Ceftriaxone 8/19 - 8/19 Zosyn 8/19>>   Subjective: Patient seen examined bedside, resting comfortably.  Sitting in bedside chair.  Reports his confusion is now resolved.  Continues with shortness of breath, remains on supplemental oxygen and with intermittent productive cough.  Discussed with patient that we will consult pulmonary critical care for consideration of chest tube/sampling of loculated pleural effusion.  Also discussed need to reinitiate antihypertensive therapy given his severely elevated blood pressure.  No other specific questions or concerns at this time.  Denies headache, no visual changes, no chest pain, no palpitations, no abdominal pain, no current nausea/vomiting, no focal weakness, no congestion, no paresthesias.  No acute events noted overnight per nurse staff.  Objective: Vitals:   10/17/21 0500 10/17/21 0627 10/17/21 0809 10/17/21 1044  BP:  (!) 188/112  (!) 166/116  Pulse:  (!) 118  (!) 115  Resp:  20  20  Temp:  (!) 100.8 F (38.2 C) 99.6 F (37.6 C) 99.5 F (37.5 C)  TempSrc:  Oral Oral Oral  SpO2:  95%  94%  Weight: 130 kg     Height:        Intake/Output Summary (Last 24 hours) at 10/17/2021 1143 Last data filed at 10/17/2021 0300 Gross per 24 hour  Intake 2629.23 ml  Output 550 ml  Net 2079.23 ml   Filed Weights   10/16/21 0912 10/16/21 1843 10/17/21 0500  Weight: 127 kg 130.9 kg 130 kg    Examination:  Physical Exam: GEN: NAD, alert and oriented x 3, obese HEENT: NCAT, PERRL, EOMI, sclera clear, MMM PULM: Breath sounds diminished right base with crackles, normal respiratory effort without accessory muscle use, no wheezing, on 2 L nasal cannula with SPO2 95% at rest CV: RRR w/o M/G/R GI: abd soft, NTND, NABS, no R/G/M MSK: no peripheral edema, muscle strength  globally intact 5/5 bilateral upper/lower extremities NEURO: CN II-XII intact, no focal deficits, sensation to light touch intact PSYCH: normal mood/affect Integumentary: dry/intact, no rashes or wounds    Data Reviewed: I have personally reviewed following labs and imaging studies  CBC: Recent Labs  Lab 10/16/21 0946 10/17/21 0456  WBC 19.9* 21.4*  NEUTROABS 15.6*  --   HGB 14.8 14.5  HCT 43.3 43.9  MCV 86.6 89.2  PLT 729* 714*   Basic Metabolic Panel: Recent Labs  Lab 10/16/21 0946 10/17/21 0456  NA 136 135  K 4.0 4.1  CL 102 101  CO2 27 26  GLUCOSE 115* 129*  BUN 10 13  CREATININE 1.13 1.17  CALCIUM 9.1 8.9   GFR: Estimated Creatinine Clearance: 121.2 mL/min (by C-G formula based on SCr of 1.17 mg/dL). Liver Function Tests: Recent Labs  Lab 10/16/21 0946  AST 17  ALT 23  ALKPHOS 57  BILITOT 1.3*  PROT 6.4*  ALBUMIN 2.8*   No results for input(s): "LIPASE", "AMYLASE" in the last 168 hours. No results for input(s): "AMMONIA" in the last 168 hours. Coagulation Profile: Recent Labs  Lab 10/16/21 0946  INR 1.2   Cardiac Enzymes: No results for input(s): "CKTOTAL", "CKMB", "CKMBINDEX", "TROPONINI" in the last 168 hours. BNP (last 3 results) No results for input(s): "PROBNP" in the last  8760 hours. HbA1C: No results for input(s): "HGBA1C" in the last 72 hours. CBG: No results for input(s): "GLUCAP" in the last 168 hours. Lipid Profile: No results for input(s): "CHOL", "HDL", "LDLCALC", "TRIG", "CHOLHDL", "LDLDIRECT" in the last 72 hours. Thyroid Function Tests: No results for input(s): "TSH", "T4TOTAL", "FREET4", "T3FREE", "THYROIDAB" in the last 72 hours. Anemia Panel: No results for input(s): "VITAMINB12", "FOLATE", "FERRITIN", "TIBC", "IRON", "RETICCTPCT" in the last 72 hours. Sepsis Labs: Recent Labs  Lab 10/16/21 0956  LATICACIDVEN 1.2    Recent Results (from the past 240 hour(s))  SARS Coronavirus 2 by RT PCR (hospital order, performed  in [facility]) *cepheid single result test* Anterior Nasal Swab     Status: None   Collection Time: 10/13/21  8:47 PM   Specimen: Anterior Nasal Swab  Result Value Ref Range Status   SARS Coronavirus 2 by RT PCR NEGATIVE NEGATIVE Final    Comment: (NOTE) SARS-CoV-2 target nucleic acids are NOT DETECTED.  The SARS-CoV-2 RNA is generally detectable in upper and lower respiratory specimens during the acute phase of infection. The lowest concentration of SARS-CoV-2 viral copies this assay can detect is 250 copies / mL. A negative result does not preclude SARS-CoV-2 infection and should not be used as the sole basis for treatment or other patient management decisions.  A negative result may occur with improper specimen collection / handling, submission of specimen other than nasopharyngeal swab, presence of viral mutation(s) within the areas targeted by this assay, and inadequate number of viral copies (<250 copies / mL). A negative result must be combined with clinical observations, patient history, and epidemiological information.  Fact Sheet for Patients:   RoadLapTop.co.zahttps://www.fda.gov/media/158405/download  Fact Sheet for Healthcare Providers: http://kim-miller.com/https://www.fda.gov/media/158404/download  This test is not yet approved or  cleared by the [place] FDA and has been authorized for detection and/or diagnosis of SARS-CoV-2 by FDA under an Emergency Use Authorization (EUA).  This EUA will remain in effect (meaning this test can be used) for the duration of the COVID-19 declaration under Section 564(b)(1) of the Act, 21 U.S.C. section 360bbb-3(b)(1), unless the authorization is terminated or revoked sooner.  Performed at [facility] [place], [address] [place], [place] [address]   Blood Culture (routine x 2)     Status: None (Preliminary result)   Collection Time: 10/16/21  9:50 AM   Specimen: Right Antecubital; Blood  Result Value Ref Range Status   Specimen  Description   Final    RIGHT ANTECUBITAL BLOOD Performed at [facility] [place], [address] [place], [place] [address]    Special Requests   Final    Blood Culture adequate volume BOTTLES DRAWN AEROBIC AND ANAEROBIC Performed at [facility] [place], [address] [place], [place] [address]    Culture   Final    NO GROWTH < 24 HOURS Performed at [facility], [address] [place], [place] [address]    Report Status PENDING  Incomplete  Blood Culture (routine x 2)     Status: None (Preliminary result)   Collection Time: 10/16/21 10:05 AM   Specimen: Left Antecubital; Blood  Result Value Ref Range Status   Specimen Description   Final    LEFT ANTECUBITAL BLOOD Performed at [facility] [place], [address] [place], [place] [address]    Special Requests   Final    Blood Culture adequate volume BOTTLES DRAWN AEROBIC AND ANAEROBIC Performed at [facility]  [address] [place], [place] [address]    Culture   Final    NO GROWTH < 24 HOURS Performed at [facility], [address], [place], [place] [address]    Report Status PENDING  Incomplete  Resp Panel by RT-PCR (Flu A&B, Covid) Anterior Nasal Swab     Status: None   Collection Time: 10/16/21 12:13 PM   Specimen: Anterior Nasal Swab  Result Value Ref Range Status   SARS Coronavirus 2 by RT PCR NEGATIVE NEGATIVE Final    Comment: (NOTE) SARS-CoV-2 target nucleic acids are NOT DETECTED.  The SARS-CoV-2 RNA is generally detectable in upper respiratory specimens during the acute phase of infection. The lowest concentration of SARS-CoV-2 viral copies this assay can detect is 138 copies/mL. A negative result does not preclude SARS-Cov-2 infection and should not be used as the sole basis for treatment or other patient management decisions. A negative result may occur with  improper specimen collection/handling, submission of specimen other than nasopharyngeal swab, presence of  viral mutation(s) within the areas targeted by this assay, and inadequate number of viral copies(<138 copies/mL). A negative result must be combined with clinical observations, patient history, and epidemiological information. The expected result is Negative.  Fact Sheet for Patients:  BloggerCourse.com  Fact Sheet for Healthcare Providers:  SeriousBroker.it  This test is no t yet approved or cleared by the [place] FDA and  has been authorized for detection and/or diagnosis of SARS-CoV-2 by FDA under an Emergency Use Authorization (EUA). This EUA will remain  in effect (meaning this test can be used) for the duration of the COVID-19 declaration under Section 564(b)(1) of the Act, 21 U.S.C.section 360bbb-3(b)(1), unless the authorization is terminated  or revoked sooner.       Influenza A by PCR NEGATIVE NEGATIVE Final   Influenza B by PCR NEGATIVE NEGATIVE Final    Comment: (NOTE) The Xpert Xpress SARS-CoV-2/FLU/RSV plus assay is intended as an aid in the diagnosis of influenza from Nasopharyngeal swab specimens and should not be used as a sole basis for treatment. Nasal washings and aspirates are unacceptable for Xpert Xpress SARS-CoV-2/FLU/RSV testing.  Fact Sheet for Patients: BloggerCourse.com  Fact Sheet for Healthcare Providers: SeriousBroker.it  This test is not yet approved or cleared by the [place] FDA and has been authorized for detection and/or diagnosis of SARS-CoV-2 by FDA under an Emergency Use Authorization (EUA). This EUA will remain in effect (meaning this test can be used) for the duration of the COVID-19 declaration under Section 564(b)(1) of the Act, 21 U.S.C. section 360bbb-3(b)(1), unless the authorization is terminated or revoked.  Performed at [facility], [address] [place], [place] [address]          Radiology  Studies: CT Angio Chest PE W and/or Wo Contrast  Result Date: 10/16/2021 CLINICAL DATA:  Pulmonary embolism suspected, recent diagnosis of pneumonia EXAM: CT ANGIOGRAPHY CHEST WITH CONTRAST TECHNIQUE: Multidetector CT imaging of the chest was performed using the standard protocol during bolus administration of intravenous contrast. Multiplanar CT image reconstructions and MIPs were obtained to evaluate the vascular anatomy. RADIATION DOSE REDUCTION: This exam was performed according to the departmental dose-optimization program which includes automated exposure control, adjustment of the mA and/or kV according to patient size and/or use of iterative reconstruction technique. CONTRAST:  OMNIPAQUE IOHEXOL 350 MG/ML SOLN COMPARISON:  None Available. FINDINGS: Cardiovascular: Examination for pulmonary embolism is somewhat limited by marginal contrast bolus and breath motion  artifact, particularly in the lung bases. Within this limitation, no evidence of pulmonary embolism through the proximal segmental pulmonary arterial level. Normal heart size. No pericardial effusion. Mediastinum/Nodes: No enlarged mediastinal, hilar, or axillary lymph nodes. Thyroid gland, trachea, and esophagus demonstrate no significant findings. Lungs/Pleura: Moderate, loculated appearing right pleural effusion and trace left pleural effusion. Extensive associated atelectasis or consolidation, particularly of the right lung base, with a somewhat more focal opacity of the superior segment right lower lobe (series 5, image 42). Upper Abdomen: No acute abnormality. Musculoskeletal: No chest wall abnormality. No acute osseous findings. Review of the MIP images confirms the above findings. IMPRESSION: 1. Examination for pulmonary embolism is somewhat limited by marginal contrast bolus and breath motion artifact, particularly in the lung bases. Within this limitation, no evidence of pulmonary embolism through the proximal segmental pulmonary  arterial level. 2. Moderate, loculated appearing right pleural effusion and trace left pleural effusion. Extensive associated atelectasis or consolidation, particularly of the right lung base, with a somewhat more focal opacity of the superior segment right lower lobe. Findings are consistent with multifocal infection and/or aspiration. Electronically Signed   By: Jearld Lesch M.D.   On: 10/16/2021 13:12   DG Chest 2 View  Result Date: 10/16/2021 CLINICAL DATA:  Progressing pneumonia symptoms. EXAM: CHEST - 2 VIEW COMPARISON:  10/13/2021 and older studies. FINDINGS: Cardiac silhouette is normal in size. No mediastinal or hilar masses or evidence of adenopathy. Lung volumes are low. There is bilateral lung base opacity mostly obscuring the left and obscuring the right hemidiaphragms, similar on the left but increased on the right compared to the prior exam. Mid to upper lungs remain clear. No convincing pleural effusion.  No pneumothorax. Skeletal structures are intact. IMPRESSION: 1. Bilateral lung base opacities, increased on the right and stable on the left compared to the most recent prior chest radiograph, consistent with pneumonia, atelectasis or a combination. Findings are accentuated by low lung volumes. Electronically Signed   By: Amie Portland M.D.   On: 10/16/2021 09:35        Scheduled Meds:  amLODipine  5 mg Oral Daily   guaiFENesin  600 mg Oral BID   heparin  5,000 Units Subcutaneous Q8H   Continuous Infusions:  piperacillin-tazobactam (ZOSYN)  IV Stopped (10/17/21 0909)     LOS: 1 day    Time spent: 51 minutes spent on chart review, discussion with nursing staff, consultants, updating family and interview/physical exam; more than 50% of that time was spent in counseling and/or coordination of care.    Alvira Philips [place], DO [facility] Available via Epic secure chat 7am-7pm After these hours, please refer to coverage provider listed on [email or web address] 10/17/2021, 11:43 AM

## 2021-10-17 NOTE — Progress Notes (Signed)
Bedside ultrasound performed  Did not find a good pocket of fluid for thoracentesis  We will suggest to continue antibiotics at present

## 2021-10-18 ENCOUNTER — Inpatient Hospital Stay (HOSPITAL_COMMUNITY): Payer: Self-pay

## 2021-10-18 DIAGNOSIS — J90 Pleural effusion, not elsewhere classified: Secondary | ICD-10-CM

## 2021-10-18 LAB — RESPIRATORY PANEL BY PCR

## 2021-10-18 LAB — CBC
HCT: 43.6 % (ref 39.0–52.0)
Hemoglobin: 14.3 g/dL (ref 13.0–17.0)
MCH: 29.2 pg (ref 26.0–34.0)
MCHC: 32.8 g/dL (ref 30.0–36.0)
MCV: 89.2 fL (ref 80.0–100.0)
Platelets: 790 10*3/uL — ABNORMAL HIGH (ref 150–400)
RBC: 4.89 MIL/uL (ref 4.22–5.81)
RDW: 12.5 % (ref 11.5–15.5)
WBC: 26.9 10*3/uL — ABNORMAL HIGH (ref 4.0–10.5)
nRBC: 0 % (ref 0.0–0.2)

## 2021-10-18 LAB — BASIC METABOLIC PANEL
Anion gap: 11 (ref 5–15)
BUN: 10 mg/dL (ref 6–20)
CO2: 24 mmol/L (ref 22–32)
Calcium: 8.7 mg/dL — ABNORMAL LOW (ref 8.9–10.3)
Chloride: 99 mmol/L (ref 98–111)
Creatinine, Ser: 1.06 mg/dL (ref 0.61–1.24)
GFR, Estimated: >60 mL/min (ref >60)
Glucose, Bld: 115 mg/dL — ABNORMAL HIGH (ref 70–99)
Potassium: 3.7 mmol/L (ref 3.5–5.1)
Sodium: 134 mmol/L — ABNORMAL LOW (ref 135–145)

## 2021-10-18 LAB — EXPECTORATED SPUTUM ASSESSMENT W GRAM STAIN, RFLX TO RESP C

## 2021-10-18 LAB — STREP PNEUMONIAE URINARY ANTIGEN: Strep Pneumo Urinary Antigen: NEGATIVE

## 2021-10-18 MED ORDER — AMLODIPINE BESYLATE 10 MG PO TABS
10.0000 mg | ORAL_TABLET | Freq: Every day | ORAL | Status: DC
  Administered 2021-10-18 – 2021-10-30 (×13): 10 mg via ORAL
  Filled 2021-10-18 (×13): qty 1

## 2021-10-18 MED ORDER — LIDOCAINE HCL 1 % IJ SOLN
INTRAMUSCULAR | Status: AC
  Filled 2021-10-18: qty 20

## 2021-10-18 MED ORDER — HYDRALAZINE HCL 25 MG PO TABS
25.0000 mg | ORAL_TABLET | Freq: Three times a day (TID) | ORAL | Status: DC | PRN
  Administered 2021-10-18 – 2021-10-19 (×3): 25 mg via ORAL
  Filled 2021-10-18 (×3): qty 1

## 2021-10-18 MED ORDER — HYDRALAZINE HCL 20 MG/ML IJ SOLN
10.0000 mg | Freq: Four times a day (QID) | INTRAMUSCULAR | Status: DC | PRN
  Administered 2021-10-18: 10 mg via INTRAVENOUS
  Filled 2021-10-18: qty 1

## 2021-10-18 MED ORDER — CARVEDILOL 12.5 MG PO TABS
12.5000 mg | ORAL_TABLET | Freq: Two times a day (BID) | ORAL | Status: DC
  Administered 2021-10-18: 12.5 mg via ORAL
  Filled 2021-10-18: qty 1

## 2021-10-18 MED ORDER — TRAZODONE HCL 50 MG PO TABS
50.0000 mg | ORAL_TABLET | Freq: Every evening | ORAL | Status: DC | PRN
  Administered 2021-10-18: 50 mg via ORAL
  Filled 2021-10-18: qty 1

## 2021-10-18 NOTE — TOC Initial Note (Signed)
Transition of Care [facility]) - Initial/Assessment Note    Patient Details  Name: Todd Pena MRN: [id number] Date of Birth: 1985/06/28  Transition of Care [facility]) CM/SW Contact:    Golda Acre, RN Phone Number: 10/18/2021, 7:55 AM  Clinical Narrative:                  Transition of Care [facility]) Screening Note   Patient Details  Name: Todd Pena Date of Birth: Jan 27, 1986   Transition of Care [facility]) CM/SW Contact:    Golda Acre, RN Phone Number: 10/18/2021, 7:55 AM    Transition of Care Department [facility]) has reviewed patient and no TOC needs have been identified at this time. We will continue to monitor patient advancement through interdisciplinary progression rounds. If new patient transition needs arise, please place a TOC consult.    Expected Discharge Plan: Home/Self Care Barriers to Discharge: Continued Medical Work up   Patient Goals and CMS Choice Patient states their goals for this hospitalization and ongoing recovery are:: to return to my home and feel well CMS Medicare.gov Compare Post Acute Care list provided to:: Patient Choice offered to / list presented to : Patient  Expected Discharge Plan and Services Expected Discharge Plan: Home/Self Care   Discharge Planning Services: CM Consult   Living arrangements for the past 2 months: Single Family Home                                      Prior Living Arrangements/Services Living arrangements for the past 2 months: Single Family Home Lives with:: Spouse Patient language and need for interpreter reviewed:: Yes Do you feel safe going back to the place where you live?: Yes            Criminal Activity/Legal Involvement Pertinent to Current Situation/Hospitalization: No - Comment as needed  Activities of Daily Living Home Assistive Devices/Equipment: None ADL Screening (condition at time of admission) Patient's cognitive ability adequate to safely complete daily activities?: Yes Is  the patient deaf or have difficulty hearing?: No Does the patient have difficulty seeing, even when wearing glasses/contacts?: No Does the patient have difficulty concentrating, remembering, or making decisions?: No Patient able to express need for assistance with ADLs?: Yes Does the patient have difficulty dressing or bathing?: No Independently performs ADLs?: Yes (appropriate for developmental age) Does the patient have difficulty walking or climbing stairs?: No Weakness of Legs: None Weakness of Arms/Hands: None  Permission Sought/Granted                  Emotional Assessment Appearance:: Appears stated age Attitude/Demeanor/Rapport: Engaged Affect (typically observed): Calm Orientation: : Oriented to Self, Oriented to Place, Oriented to  Time, Oriented to Situation Alcohol / Substance Use: Never Used Psych Involvement: No (comment)  Admission diagnosis:  Multifocal pneumonia [J18.9] Sepsis without acute organ dysfunction, due to unspecified organism [facility]) [A41.9] Patient Active Problem List   Diagnosis Date Noted   Multifocal pneumonia 10/16/2021   Hypertension    Loculated right pleural effusion    PCP:  Patient, No Pcp Per Pharmacy:   [id number] [place], [place] - [address] [place] [address] Phone: [phone] Fax: [phone]  [id number] - [place], [place] - [address] AT [facility] [address] [place] [address] Phone: [phone] Fax: [phone]  [facility] [place]  [facility] [address] [place] [address] Phone: [phone] Fax: [phone]  [facility] [phone] - [place], [place] - [address] AT [facility] [address] [place] [address] Phone: [phone] Fax: [phone]  [facility] - [place], [place] - [address] [place] [address] Phone: [phone] Fax: [phone]     Social Determinants of Health (SDOH) Interventions    Readmission Risk Interventions     No data to display

## 2021-10-18 NOTE — Progress Notes (Signed)
Patient ID: Todd Pena, male   DOB: [birth year], 36 y.o.   MRN: [id number] Pt presented to [place] dept today for rt thoracentesis. On limited US rt post chest there is only a small but significantly loculated effusion which precludes safe needle placement at this time (some of fluid is behind scapula). Images were reviewed by Dr. Archer Asa. Procedure cancelled. Pt updated.

## 2021-10-18 NOTE — Progress Notes (Signed)
Contacted provider about labetalol and patient's continued overnight hypertension. Pt reports feeling 'woozy'. Pt states that at home his blood pressure medications caused migraine's which he felt was attributed to his blood pressure dropping. Pt had reported that the dilaudid was making him feel 'off' but no dilaudid was given this shift and pt still reports sensation. Pt educated on fall protocol and be alarm engaged. Pt demonstrates understanding.

## 2021-10-18 NOTE — Progress Notes (Signed)
PROGRESS NOTE    Todd Pena  UJW:[id number] DOB: 01/25/1986 DOA: 10/16/2021 PCP: Patient, No Pcp Per    Brief Narrative:   Todd Skeansaron L Kleen is a 36 y.o. male with past medical history significant for essential hypertension, morbid obesity presented to [facility] ED on 8/19 with progressive shortness of breath, cough, subjective fevers, chills, diaphoresis and fatigue.  Patient was recently seen in the ED on 8/16 with chest x-ray findings of a right lower lobe pneumonia and was discharged home with a course of doxycycline.  Patient reports no improvement despite compliance with the antibiotic.  Patient also reported 1 episode of emesis day prior with frequent bowel movements but denies any watery diarrhea.  Denies swelling in his legs, no cramping/pain in his cast.  Denies tobacco/alcohol or illicit drug use.  Reports that he was previously taking medications for high blood pressure but has not continued their use for some time now.  In the ED, temperature 98.2 F, HR 109, RR 22, BP 191/127, SPO2 91% on room air; and patient was placed on 2 L nasal cannula.  WBC 19.9, hemoglobin 14.8, platelets 729.  Sodium 136, potassium 4.0, chloride 102, CO2 27, glucose 115, BUN 10, creatinine 1.13.  AST 17, ALT 23, total bilirubin 1.3.  Lactic acid 1.2.  High sensitive troponin 7.  BNP 82.7.  Urinalysis unrevealing.  COVID-19 PCR negative.  Influenza A/B PCR negative.  Chest x-ray with bilateral lung opacities, increased on the right and stable on the left compared with most recent chest x-ray consistent with pneumonia.  CT angiogram chest PE no evidence of pulmonary embolism although limited study, moderate loculated appearing right pleural effusion and trace left pleural effusion, extensive associated atelectasis or consolidation particularly of the right lung base with more focal opacity in the superior segment right lower lobe consistent with multifocal infection and possible aspiration.  Patient was initially given  2 L NS, IV ceftriaxone and azithromycin.  Due to aspiration he was then given a dose of Zosyn.  TRH consulted for further evaluation and management of multifocal pneumonia, loculated right pleural effusion.  Assessment & Plan:   Sepsis, POA Multifocal pneumonia Patient representing to ED with progressive shortness of breath, productive cough despite outpatient doxycycline.  CT angiogram chest shows multifocal pneumonia, greatest in right lower lobe with associated loculated pleural effusion.  Patient was tachycardic, tachypneic, with SPO2 91% on room air with associated confusion.  WBC count elevated 19.9.  Seen by pulmonology for consideration of chest tube placement, per POC ultrasound 8/20 no good pocket for sampling of fluid and recommended continue IV antibiotics for now.  IR attempted ultrasound-guided thoracentesis 8/21, no window for sampling. --Pulmonology following, appreciate assistance --WBC 19.9>21.4>26.9 --Blood cultures x2: No growth x 2 days --Urine Legionella and strep pneumo antigen: Pending; reported as not collected --Zosyn 3.375 g IV every 8 hours --Mucinex 600 mg p.o. twice daily --Supportive care --CBC daily  Acute metabolic encephalopathy, POA: Resolved Upon arrival to the ED, patient reported that he was confused.  Now started on empiric antibiotics for multifocal pneumonia and mental status appears now to be back at his typical baseline.  Essential hypertension, poorly controlled Blood pressure notably elevated 191/127 on admission.  Was previously on amlodipine but has been noncompliant. --Increase amlodipine to 10 mg p.o. daily --Hydralazine 25 mg PO q8h PRN SBP >170 or DBP >110  Morbid obesity Body mass index is 40.59 kg/m.  Discussed with patient needs for aggressive lifestyle changes/weight loss as this complicates all facets  of care.  Outpatient follow-up with PCP.    DVT prophylaxis: Refusing chemical DVT prophylaxis    Code Status: Full Code Family  Communication: No family present at bedside this morning  Disposition Plan:  Level of care: Telemetry Status is: Inpatient Remains inpatient appropriate because: IV antibiotics    Consultants:  PCCM IR  Procedures:  None  Antimicrobials:  Azithromycin 8/19 - 8/19 Ceftriaxone 8/19 - 8/19 Zosyn 8/19>>   Subjective: Patient seen examined bedside, resting comfortably.  Sitting in bedside chair.  Continues to be uncomfortable.  Shortness of breath stable, not significantly improved since yesterday.  Referred to IR by PCCM for right thoracentesis, no safe window noted. No other specific questions or concerns at this time.  Denies headache, no visual changes, no chest pain, no palpitations, no abdominal pain, no current nausea/vomiting, no focal weakness, no congestion, no paresthesias.  No acute events noted overnight per nurse staff.  Objective: Vitals:   10/17/21 1744 10/18/21 0245 10/18/21 0500 10/18/21 0546  BP: (!) 161/118 (!) 192/136  (!) 174/127  Pulse: (!) 117 (!) 103  (!) 113  Resp: 16 18  18   Temp: 100.1 F (37.8 C) 99.3 F (37.4 C)  99 F (37.2 C)  TempSrc: Oral Oral  Oral  SpO2: 98% 95%  93%  Weight:   132 kg   Height:        Intake/Output Summary (Last 24 hours) at 10/18/2021 1140 Last data filed at 10/18/2021 0458 Gross per 24 hour  Intake 1131.93 ml  Output --  Net 1131.93 ml   Filed Weights   10/16/21 1843 10/17/21 0500 10/18/21 0500  Weight: 130.9 kg 130 kg 132 kg    Examination:  Physical Exam: GEN: NAD, alert and oriented x 3, obese HEENT: NCAT, PERRL, EOMI, sclera clear, MMM PULM: Breath sounds diminished right base with crackles, normal respiratory effort without accessory muscle use, no wheezing, on 2 L nasal cannula with SPO2 93% at rest CV: RRR w/o M/G/R GI: abd soft, NTND, NABS, no R/G/M MSK: no peripheral edema, muscle strength globally intact 5/5 bilateral upper/lower extremities NEURO: CN II-XII intact, no focal deficits, sensation to  light touch intact PSYCH: normal mood/affect Integumentary: dry/intact, no rashes or wounds    Data Reviewed: I have personally reviewed following labs and imaging studies  CBC: Recent Labs  Lab 10/16/21 0946 10/17/21 0456 10/18/21 0454  WBC 19.9* 21.4* 26.9*  NEUTROABS 15.6*  --   --   HGB 14.8 14.5 14.3  HCT 43.3 43.9 43.6  MCV 86.6 89.2 89.2  PLT 729* 714* 790*   Basic Metabolic Panel: Recent Labs  Lab 10/16/21 0946 10/17/21 0456 10/18/21 0454  NA 136 135 134*  K 4.0 4.1 3.7  CL 102 101 99  CO2 27 26 24   GLUCOSE 115* 129* 115*  BUN 10 13 10   CREATININE 1.13 1.17 1.06  CALCIUM 9.1 8.9 8.7*   GFR: Estimated Creatinine Clearance: 134.8 mL/min (by C-G formula based on SCr of 1.06 mg/dL). Liver Function Tests: Recent Labs  Lab 10/16/21 0946  AST 17  ALT 23  ALKPHOS 57  BILITOT 1.3*  PROT 6.4*  ALBUMIN 2.8*   No results for input(s): "LIPASE", "AMYLASE" in the last 168 hours. No results for input(s): "AMMONIA" in the last 168 hours. Coagulation Profile: Recent Labs  Lab 10/16/21 0946  INR 1.2   Cardiac Enzymes: No results for input(s): "CKTOTAL", "CKMB", "CKMBINDEX", "TROPONINI" in the last 168 hours. BNP (last 3 results) No results for input(s): "PROBNP"  in the last 8760 hours. HbA1C: No results for input(s): "HGBA1C" in the last 72 hours. CBG: No results for input(s): "GLUCAP" in the last 168 hours. Lipid Profile: No results for input(s): "CHOL", "HDL", "LDLCALC", "TRIG", "CHOLHDL", "LDLDIRECT" in the last 72 hours. Thyroid Function Tests: No results for input(s): "TSH", "T4TOTAL", "FREET4", "T3FREE", "THYROIDAB" in the last 72 hours. Anemia Panel: No results for input(s): "VITAMINB12", "FOLATE", "FERRITIN", "TIBC", "IRON", "RETICCTPCT" in the last 72 hours. Sepsis Labs: Recent Labs  Lab 10/16/21 0956  LATICACIDVEN 1.2    Recent Results (from the past 240 hour(s))  SARS Coronavirus 2 by RT PCR (hospital order, performed in [facility]  hospital lab) *cepheid single result test* Anterior Nasal Swab     Status: None   Collection Time: 10/13/21  8:47 PM   Specimen: Anterior Nasal Swab  Result Value Ref Range Status   SARS Coronavirus 2 by RT PCR NEGATIVE NEGATIVE Final    Comment: (NOTE) SARS-CoV-2 target nucleic acids are NOT DETECTED.  The SARS-CoV-2 RNA is generally detectable in upper and lower respiratory specimens during the acute phase of infection. The lowest concentration of SARS-CoV-2 viral copies this assay can detect is 250 copies / mL. A negative result does not preclude SARS-CoV-2 infection and should not be used as the sole basis for treatment or other patient management decisions.  A negative result may occur with improper specimen collection / handling, submission of specimen other than nasopharyngeal swab, presence of viral mutation(s) within the areas targeted by this assay, and inadequate number of viral copies (<250 copies / mL). A negative result must be combined with clinical observations, patient history, and epidemiological information.  Fact Sheet for Patients:   RoadLapTop.co.za  Fact Sheet for Healthcare Providers: http://kim-miller.com/  This test is not yet approved or  cleared by the [place] FDA and has been authorized for detection and/or diagnosis of SARS-CoV-2 by FDA under an Emergency Use Authorization (EUA).  This EUA will remain in effect (meaning this test can be used) for the duration of the COVID-19 declaration under Section 564(b)(1) of the Act, 21 U.S.C. section 360bbb-3(b)(1), unless the authorization is terminated or revoked sooner.  Performed at [facility], [address] [place], [place] [address]   Blood Culture (routine x 2)     Status: None (Preliminary result)   Collection Time: 10/16/21  9:50 AM   Specimen: Right Antecubital; Blood  Result Value Ref Range Status   Specimen Description   Final     RIGHT ANTECUBITAL BLOOD Performed at [facility], [address] [place], [place] [address]    Special Requests   Final    Blood Culture adequate volume BOTTLES DRAWN AEROBIC AND ANAEROBIC Performed at [facility], [address] [place], [place] [address]    Culture   Final    NO GROWTH 2 DAYS Performed at [facility], [address] [place] [address], [place], [place] [address]    Report Status PENDING  Incomplete  Blood Culture (routine x 2)     Status: None (Preliminary result)   Collection Time: 10/16/21 10:05 AM   Specimen: Left Antecubital; Blood  Result Value Ref Range Status   Specimen Description   Final    LEFT ANTECUBITAL BLOOD Performed at [facility], [address] [place], [place] [address]    Special Requests   Final    Blood Culture adequate volume BOTTLES DRAWN AEROBIC AND ANAEROBIC Performed at [facility], [address] [place], [place] [address]    Culture   Final    NO GROWTH 2 DAYS Performed at [facility], [address], [place], [place] [address]    Report Status PENDING  Incomplete  Resp Panel by RT-PCR (Flu A&B, Covid) Anterior Nasal Swab     Status: None   Collection Time: 10/16/21 12:13 PM   Specimen: Anterior Nasal Swab  Result Value Ref Range Status   SARS Coronavirus 2 by RT PCR NEGATIVE NEGATIVE Final    Comment: (NOTE) SARS-CoV-2 target nucleic acids are NOT DETECTED.  The SARS-CoV-2 RNA is generally detectable in upper respiratory specimens during the acute phase of infection. The lowest concentration of SARS-CoV-2 viral copies this assay can detect is 138 copies/mL. A negative result does not preclude SARS-Cov-2 infection and should not be used as the sole basis for treatment or other patient management decisions. A negative result may occur with  improper specimen collection/handling, submission of specimen other than nasopharyngeal swab, presence of viral mutation(s) within the areas  targeted by this assay, and inadequate number of viral copies(<138 copies/mL). A negative result must be combined with clinical observations, patient history, and epidemiological information. The expected result is Negative.  Fact Sheet for Patients:  BloggerCourse.com  Fact Sheet for Healthcare Providers:  SeriousBroker.it  This test is no t yet approved or cleared by the [place] FDA and  has been authorized for detection and/or diagnosis of SARS-CoV-2 by FDA under an Emergency Use Authorization (EUA). This EUA will remain  in effect (meaning this test can be used) for the duration of the COVID-19 declaration under Section 564(b)(1) of the Act, 21 U.S.C.section 360bbb-3(b)(1), unless the authorization is terminated  or revoked sooner.       Influenza A by PCR NEGATIVE NEGATIVE Final   Influenza B by PCR NEGATIVE NEGATIVE Final    Comment: (NOTE) The Xpert Xpress SARS-CoV-2/FLU/RSV plus assay is intended as an aid in the diagnosis of influenza from Nasopharyngeal swab specimens and should not be used as a sole basis for treatment. Nasal washings and aspirates are unacceptable for Xpert Xpress SARS-CoV-2/FLU/RSV testing.  Fact Sheet for Patients: BloggerCourse.com  Fact Sheet for Healthcare Providers: SeriousBroker.it  This test is not yet approved or cleared by the [place] FDA and has been authorized for detection and/or diagnosis of SARS-CoV-2 by FDA under an Emergency Use Authorization (EUA). This EUA will remain in effect (meaning this test can be used) for the duration of the COVID-19 declaration under Section 564(b)(1) of the Act, 21 U.S.C. section 360bbb-3(b)(1), unless the authorization is terminated or revoked.  Performed at [facility], [address] [place], [place] [address]   MRSA Next Gen by PCR, Nasal     Status: None   Collection  Time: 10/17/21  1:53 PM   Specimen: Nasal Mucosa; Nasal Swab  Result Value Ref Range Status   MRSA by PCR Next Gen NOT DETECTED NOT DETECTED Final    Comment: (NOTE) The GeneXpert MRSA Assay (FDA approved for NASAL specimens only), is one component of a comprehensive MRSA colonization surveillance program. It is not intended to diagnose MRSA infection nor to guide or monitor treatment for MRSA infections. Test performance is not FDA approved in patients less than 74 years old. Performed at [facility], [address], [place], [place] [address]          Radiology Studies: CT Angio Chest PE W and/or  Wo Contrast  Result Date: 10/16/2021 CLINICAL DATA:  Pulmonary embolism suspected, recent diagnosis of pneumonia EXAM: CT ANGIOGRAPHY CHEST WITH CONTRAST TECHNIQUE: Multidetector CT imaging of the chest was performed using the standard protocol during bolus administration of intravenous contrast. Multiplanar CT image reconstructions and MIPs were obtained to evaluate the vascular anatomy. RADIATION DOSE REDUCTION: This exam was performed according to the departmental dose-optimization program which includes automated exposure control, adjustment of the mA and/or kV according to patient size and/or use of iterative reconstruction technique. CONTRAST:  OMNIPAQUE IOHEXOL 350 MG/ML SOLN COMPARISON:  None Available. FINDINGS: Cardiovascular: Examination for pulmonary embolism is somewhat limited by marginal contrast bolus and breath motion artifact, particularly in the lung bases. Within this limitation, no evidence of pulmonary embolism through the proximal segmental pulmonary arterial level. Normal heart size. No pericardial effusion. Mediastinum/Nodes: No enlarged mediastinal, hilar, or axillary lymph nodes. Thyroid gland, trachea, and esophagus demonstrate no significant findings. Lungs/Pleura: Moderate, loculated appearing right pleural effusion and trace left pleural  effusion. Extensive associated atelectasis or consolidation, particularly of the right lung base, with a somewhat more focal opacity of the superior segment right lower lobe (series 5, image 42). Upper Abdomen: No acute abnormality. Musculoskeletal: No chest wall abnormality. No acute osseous findings. Review of the MIP images confirms the above findings. IMPRESSION: 1. Examination for pulmonary embolism is somewhat limited by marginal contrast bolus and breath motion artifact, particularly in the lung bases. Within this limitation, no evidence of pulmonary embolism through the proximal segmental pulmonary arterial level. 2. Moderate, loculated appearing right pleural effusion and trace left pleural effusion. Extensive associated atelectasis or consolidation, particularly of the right lung base, with a somewhat more focal opacity of the superior segment right lower lobe. Findings are consistent with multifocal infection and/or aspiration. Electronically Signed   By: Jearld Lesch M.D.   On: 10/16/2021 13:12        Scheduled Meds:  amLODipine  10 mg Oral Daily   guaiFENesin  600 mg Oral BID   Continuous Infusions:  piperacillin-tazobactam (ZOSYN)  IV 3.375 g (10/18/21 0458)     LOS: 2 days    Time spent: 51 minutes spent on chart review, discussion with nursing staff, consultants, updating family and interview/physical exam; more than 50% of that time was spent in counseling and/or coordination of care.    Alvira Philips [place], DO [facility] Available via Epic secure chat 7am-7pm After these hours, please refer to coverage provider listed on [email or web address] 10/18/2021, 11:40 AM

## 2021-10-18 NOTE — Consult Note (Signed)
NAME:  Todd Pena, MRN:  [id number], DOB:  01-Sep-1985, LOS: 2 ADMISSION DATE:  10/16/2021, CONSULTATION DATE: 10/17/2021 REFERRING MD: Dr. [place], CHIEF COMPLAINT: Loculated effusion, multilobar pneumonia  bRIEF  Patient admitted with worsening shortness of breath Recent diagnosis of pneumonia for which she was treated with a course of doxycycline, symptoms did progress leading to presentation to the emergency department Has had about a week of feeling unwell with exertional dyspnea cough, mild hemoptysis Subjective fevers chills diaphoresis Decreased exercise tolerance  Did not see any significant improvement in his symptoms despite completing course of antibiotics, there was associated diarrhea with this antibiotic use  Denies any chest pains or chest discomfort  Background history of hypertension  Pertinent  Medical History   Past Medical History:  Diagnosis Date   Hypertension     Significant Hospital Events: Including procedures, antibiotic start and stop dates in addition to other pertinent events   CT chest with loculated effusion, multilobar infiltrate  Interim History / Subjective:    10/18/2021 - TMax 101 yesterday. AFebrile since then. WBC high. On abx. On 2L [place]. He nis not sure if he is better   Objective   Blood pressure (!) 174/127, pulse (!) 113, temperature 99 F (37.2 C), temperature source Oral, resp. rate 18, height 5\' 11"  (1.803 m), weight 132 kg, SpO2 93 %.        Intake/Output Summary (Last 24 hours) at 10/18/2021 1000 Last data filed at 10/18/2021 0458 Gross per 24 hour  Intake 1131.93 ml  Output --  Net 1131.93 ml   Filed Weights   10/16/21 1843 10/17/21 0500 10/18/21 0500  Weight: 130.9 kg 130 kg 132 kg    Examination: General: looks mildly miserable Neuro: Alert and Oriented x 3. GCS 15. Speech normal Psych: Pleasant Resp:  Barrel Chest - no.  Wheeze - no, Crackles - no, No overt respiratory distress CVS: Normal heart sounds.  Murmurs - no Ext: Stigmata of Connective Tissue Disease - no HEENT: Normal upper airway. PEERL +. No post nasal drip On o2  Resolved Hospital Problem list     Assessment & Plan:  Hypoxemic respiratory failure Respiratory failure secondary to multilobar pneumonia Hemoptysis related to pneumonia -Failed outpatient treatment with doxycycline  Plan - Abx as below - check pct, rvp, urine strep, urine leg - keep pusle ox  >92%  Loculated pleural effusion - right  - not much per CCM MD yesterday  Plan  - try luck with IR guided 10/20/21 - May require chest tube placement versus Continued antibiotic therapy    Anti-infectives (From admission, onward)    Start     Dose/Rate Route Frequency Ordered Stop   10/16/21 2100  piperacillin-tazobactam (ZOSYN) IVPB 3.375 g        3.375 g 12.5 mL/hr over 240 Minutes Intravenous Every 8 hours 10/16/21 1912     10/16/21 1400  piperacillin-tazobactam (ZOSYN) IVPB 3.375 g        3.375 g 12.5 mL/hr over 240 Minutes Intravenous  Once 10/16/21 1347 10/16/21 1735   10/16/21 1045  cefTRIAXone (ROCEPHIN) 2 g in sodium chloride 0.9 % 100 mL IVPB  Status:  Discontinued        2 g 200 mL/hr over 30 Minutes Intravenous Every 24 hours 10/16/21 1036 10/16/21 1902   10/16/21 1045  azithromycin (ZITHROMAX) 500 mg in sodium chloride 0.9 % 250 mL IVPB  Status:  Discontinued        500 mg 250 mL/hr over 60 Minutes Intravenous  Every 24 hours 10/16/21 1036 10/16/21 1902        Best Practice (right click and "Reselect all SmartList Selections" daily)   Diet/type: Regular consistency (see orders) DVT prophylaxis: prophylactic heparin  GI prophylaxis: N/A Lines: N/A Foley:  N/A Code Status:  full code Last date of multidisciplinary goals of care discussion [Per primary      SIGNATURE    Dr. Brand Males, M.D., F.C.C.P,  Pulmonary and Critical Care Medicine Staff Physician, [place] Director - Interstitial Lung Disease  Program   Medical Director - [facility] [place], [place], [address]  NPI Number:  NPI [id number] [place] Number: [id number]  Pager: [phone], If no answer  -> Check AMION or Try [place] Telephone (clinical office): [phone] Telephone (research): [phone]  10:00 AM 10/18/2021   LABS    PULMONARY No results for input(s): "PHART", "PCO2ART", "PO2ART", "HCO3", "TCO2", "O2SAT" in the last 168 hours.  Invalid input(s): "PCO2", "PO2"  CBC Recent Labs  Lab 10/16/21 0946 10/17/21 0456 10/18/21 0454  HGB 14.8 14.5 14.3  HCT 43.3 43.9 43.6  WBC 19.9* 21.4* 26.9*  PLT 729* 714* 790*    COAGULATION Recent Labs  Lab 10/16/21 0946  INR 1.2    CARDIAC  No results for input(s): "TROPONINI" in the last 168 hours. No results for input(s): "PROBNP" in the last 168 hours.   CHEMISTRY Recent Labs  Lab 10/16/21 0946 10/17/21 0456 10/18/21 0454  NA 136 135 134*  K 4.0 4.1 3.7  CL 102 101 99  CO2 27 26 24   GLUCOSE 115* 129* 115*  BUN 10 13 10   CREATININE 1.13 1.17 1.06  CALCIUM 9.1 8.9 8.7*   Estimated Creatinine Clearance: 134.8 mL/min (by C-G formula based on SCr of 1.06 mg/dL).   LIVER Recent Labs  Lab 10/16/21 0946  AST 17  ALT 23  ALKPHOS 57  BILITOT 1.3*  PROT 6.4*  ALBUMIN 2.8*  INR 1.2     INFECTIOUS Recent Labs  Lab 10/16/21 0956  LATICACIDVEN 1.2     ENDOCRINE CBG (last 3)  No results for input(s): "GLUCAP" in the last 72 hours.       IMAGING x48h  - image(s) personally visualized  -   highlighted in bold CT Angio Chest PE W and/or Wo Contrast  Result Date: 10/16/2021 CLINICAL DATA:  Pulmonary embolism suspected, recent diagnosis of pneumonia EXAM: CT ANGIOGRAPHY CHEST WITH CONTRAST TECHNIQUE: Multidetector CT imaging of the chest was performed using the standard protocol during bolus administration of intravenous contrast. Multiplanar CT image  reconstructions and MIPs were obtained to evaluate the vascular anatomy. RADIATION DOSE REDUCTION: This exam was performed according to the departmental dose-optimization program which includes automated exposure control, adjustment of the mA and/or kV according to patient size and/or use of iterative reconstruction technique. CONTRAST:  132mL OMNIPAQUE IOHEXOL 350 MG/ML SOLN COMPARISON:  None Available. FINDINGS: Cardiovascular: Examination for pulmonary embolism is somewhat limited by marginal contrast bolus and breath motion artifact, particularly in the lung bases. Within this limitation, no evidence of pulmonary embolism through the proximal segmental pulmonary arterial level. Normal heart size. No pericardial effusion. Mediastinum/Nodes: No enlarged mediastinal, hilar, or axillary lymph nodes. Thyroid gland, trachea, and esophagus demonstrate no significant findings. Lungs/Pleura: Moderate, loculated appearing right pleural effusion and trace left pleural effusion. Extensive associated atelectasis or consolidation, particularly of the right lung base, with a somewhat more focal opacity  of the superior segment right lower lobe (series 5, image 42). Upper Abdomen: No acute abnormality. Musculoskeletal: No chest wall abnormality. No acute osseous findings. Review of the MIP images confirms the above findings. IMPRESSION: 1. Examination for pulmonary embolism is somewhat limited by marginal contrast bolus and breath motion artifact, particularly in the lung bases. Within this limitation, no evidence of pulmonary embolism through the proximal segmental pulmonary arterial level. 2. Moderate, loculated appearing right pleural effusion and trace left pleural effusion. Extensive associated atelectasis or consolidation, particularly of the right lung base, with a somewhat more focal opacity of the superior segment right lower lobe. Findings are consistent with multifocal infection and/or aspiration. Electronically  Signed   By: Jearld Lesch M.D.   On: 10/16/2021 13:12

## 2021-10-19 LAB — PHOSPHORUS: Phosphorus: 3.2 mg/dL (ref 2.5–4.6)

## 2021-10-19 LAB — CBC
HCT: 40.6 % (ref 39.0–52.0)
Hemoglobin: 13.7 g/dL (ref 13.0–17.0)
MCH: 29.3 pg (ref 26.0–34.0)
MCHC: 33.7 g/dL (ref 30.0–36.0)
MCV: 86.9 fL (ref 80.0–100.0)
Platelets: 760 10*3/uL — ABNORMAL HIGH (ref 150–400)
RBC: 4.67 MIL/uL (ref 4.22–5.81)
RDW: 12.8 % (ref 11.5–15.5)
WBC: 24.7 10*3/uL — ABNORMAL HIGH (ref 4.0–10.5)
nRBC: 0 % (ref 0.0–0.2)

## 2021-10-19 LAB — HEPATIC FUNCTION PANEL
ALT: 19 U/L (ref 0–44)
AST: 16 U/L (ref 15–41)
Albumin: 3.3 g/dL — ABNORMAL LOW (ref 3.5–5.0)
Alkaline Phosphatase: 73 U/L (ref 38–126)
Bilirubin, Direct: 0.4 mg/dL — ABNORMAL HIGH (ref 0.0–0.2)
Indirect Bilirubin: 1.2 mg/dL — ABNORMAL HIGH (ref 0.3–0.9)
Total Bilirubin: 1.6 mg/dL — ABNORMAL HIGH (ref 0.3–1.2)
Total Protein: 7.3 g/dL (ref 6.5–8.1)

## 2021-10-19 LAB — BASIC METABOLIC PANEL
Anion gap: 11 (ref 5–15)
BUN: 12 mg/dL (ref 6–20)
CO2: 26 mmol/L (ref 22–32)
Calcium: 9.1 mg/dL (ref 8.9–10.3)
Chloride: 98 mmol/L (ref 98–111)
Creatinine, Ser: 1.1 mg/dL (ref 0.61–1.24)
GFR, Estimated: >60 mL/min (ref >60)
Glucose, Bld: 102 mg/dL — ABNORMAL HIGH (ref 70–99)
Potassium: 3.6 mmol/L (ref 3.5–5.1)
Sodium: 135 mmol/L (ref 135–145)

## 2021-10-19 LAB — MAGNESIUM: Magnesium: 2 mg/dL (ref 1.7–2.4)

## 2021-10-19 LAB — LEGIONELLA PNEUMOPHILA SEROGP 1 UR AG: L. pneumophila Serogp 1 Ur Ag: NEGATIVE

## 2021-10-19 LAB — PROCALCITONIN: Procalcitonin: 1.13 ng/mL

## 2021-10-19 LAB — LACTATE DEHYDROGENASE: LDH: 145 U/L (ref 98–192)

## 2021-10-19 MED ORDER — MELATONIN 3 MG PO TABS
3.0000 mg | ORAL_TABLET | Freq: Every day | ORAL | Status: DC
  Administered 2021-10-19 – 2021-10-29 (×10): 3 mg via ORAL
  Filled 2021-10-19 (×11): qty 1

## 2021-10-19 MED ORDER — CARVEDILOL 25 MG PO TABS
25.0000 mg | ORAL_TABLET | Freq: Two times a day (BID) | ORAL | Status: DC
Start: 2021-10-19 — End: 2021-10-30
  Administered 2021-10-19 – 2021-10-30 (×23): 25 mg via ORAL
  Filled 2021-10-19 (×23): qty 1

## 2021-10-19 MED ORDER — TRAZODONE HCL 100 MG PO TABS
100.0000 mg | ORAL_TABLET | Freq: Every evening | ORAL | Status: DC | PRN
Start: 2021-10-19 — End: 2021-10-30
  Administered 2021-10-20 – 2021-10-28 (×3): 100 mg via ORAL
  Filled 2021-10-19 (×3): qty 1

## 2021-10-19 MED ORDER — IPRATROPIUM-ALBUTEROL 0.5-2.5 (3) MG/3ML IN SOLN
3.0000 mL | Freq: Four times a day (QID) | RESPIRATORY_TRACT | Status: DC | PRN
  Administered 2021-10-19 – 2021-10-20 (×3): 3 mL via RESPIRATORY_TRACT
  Filled 2021-10-19 (×3): qty 3

## 2021-10-19 NOTE — Consult Note (Addendum)
NAME:  Todd Pena, MRN:  [id number], DOB:  Dec 08, 1985, LOS: 3 ADMISSION DATE:  10/16/2021, CONSULTATION DATE: 10/17/2021 REFERRING MD: Dr. [place], CHIEF COMPLAINT: Loculated effusion, multilobar pneumonia  bRIEF  Patient admitted with worsening shortness of breath Recent diagnosis of pneumonia for which she was treated with a course of doxycycline, symptoms did progress leading to presentation to the emergency department Has had about a week of feeling unwell with exertional dyspnea cough, mild hemoptysis Subjective fevers chills diaphoresis Decreased exercise tolerance  Did not see any significant improvement in his symptoms despite completing course of antibiotics, there was associated diarrhea with this antibiotic use  Denies any chest pains or chest discomfort  Background history of hypertension  Pertinent  Medical History   Past Medical History:  Diagnosis Date   Hypertension     Significant Hospital Events: Including procedures, antibiotic start and stop dates in addition to other pertinent events   CT chest with loculated effusion, multilobar infiltrate- CCM [place] 0-not enough fluid to tap on right side 8/22 - TMax 101 yesterday. AFebrile since then. WBC high. On abx. On 2L [place]. He nis not sure if he is better. IR done [place] -> small loculated effusion with fluid behind scapula. Procdure could not be done due to size and safety  Interim History / Subjective:    10/19/2021 - fever curve better. WBC better. Feels better. Looks better. Took shower with o2. Culure negative so far  Objective   Blood pressure (!) 148/97, pulse (!) 118, temperature 99.2 F (37.3 C), temperature source Oral, resp. rate 20, height 5\' 11"  (1.803 m), weight 132.1 kg, SpO2 96 %.        Intake/Output Summary (Last 24 hours) at 10/19/2021 1154 Last data filed at 10/19/2021 1000 Gross per 24 hour  Intake 808.43 ml  Output --  Net 808.43 ml   Filed Weights   10/17/21 0500 10/18/21 0500 10/19/21  0500  Weight: 130 kg 132 kg 132.1 kg    Examination: General: No distress. Looks better Neuro: Alert and Oriented x 3. GCS 15. Speech normal Psych: Pleasant Resp:  Barrel Chest - no.  Wheeze - no, Crackles - no, No overt respiratory distress. DIMINISHED RIGH BASE CVS: Normal heart sounds. Murmurs - no Ext: Stigmata of Connective Tissue Disease - no HEENT: Normal upper airway. PEERL +. No post nasal drip   Resolved Hospital Problem list     Assessment & Plan:  Hypoxemic respiratory failure Respiratory failure secondary to multilobar pneumonia and RLL small loculated effuion Hemoptysis related to pneumonia -Failed outpatient treatment with doxycycline - culture and pcr neg  10/19/2021 - clinicaly better. Effusion too small to tap   Plan - keep pusle ox  >92% - retest toda on room air with exertional pulse ox -continue antibioptics - can change to oral antibiotics broad for total 2-3 weeks - aim for dc  home 10/20/21 +/- with o2 - PCCM followp as below with CXR Future Appointments  Date Time Provider Department Center  10/28/2021  9:00 AM Parrett, 10/30/2021, NP [facility] None       Anti-infectives (From admission, onward)    Start     Dose/Rate Route Frequency Ordered Stop   10/16/21 2100  piperacillin-tazobactam (ZOSYN) IVPB 3.375 g        3.375 g 12.5 mL/hr over 240 Minutes Intravenous Every 8 hours 10/16/21 1912     10/16/21 1400  piperacillin-tazobactam (ZOSYN) IVPB 3.375 g        3.375 g 12.5 mL/hr  over 240 Minutes Intravenous  Once 10/16/21 1347 10/16/21 1735   10/16/21 1045  cefTRIAXone (ROCEPHIN) 2 g in sodium chloride 0.9 % 100 mL IVPB  Status:  Discontinued        2 g 200 mL/hr over 30 Minutes Intravenous Every 24 hours 10/16/21 1036 10/16/21 1902   10/16/21 1045  azithromycin (ZITHROMAX) 500 mg in sodium chloride 0.9 % 250 mL IVPB  Status:  Discontinued        500 mg 250 mL/hr over 60 Minutes Intravenous Every 24 hours 10/16/21 1036 10/16/21 1902         Best Practice (right click and "Reselect all SmartList Selections" daily)   Diet/type: Regular consistency (see orders) DVT prophylaxis: prophylactic heparin  GI prophylaxis: N/A Lines: N/A Foley:  N/A Code Status:  full code Last date of multidisciplinary goals of care discussion [Per primary  Followup Future Appointments  Date Time Provider Department Center  10/28/2021  9:00 AM Parrett, Virgel Bouquet, NP [facility] None       SIGNATURE    Dr. Kalman Shan, M.D., F.C.C.P,  Pulmonary and Critical Care Medicine Staff Physician, [facility] Director - Interstitial Lung Disease  Program  Medical Director - [facility] Pulmonary Fibrosis [facility] [place], [place], [address]  NPI Number:  NPI #[id number] DEA Number: [id number]  Pager: [phone], If no answer  -> Check AMION or Try [phone] Telephone (clinical office): [phone] Telephone (research): [phone]  11:54 AM 10/19/2021   LABS    PULMONARY No results for input(s): "PHART", "PCO2ART", "PO2ART", "HCO3", "TCO2", "O2SAT" in the last 168 hours.  Invalid input(s): "PCO2", "PO2"  CBC Recent Labs  Lab 10/17/21 0456 10/18/21 0454 10/19/21 0458  HGB 14.5 14.3 13.7  HCT 43.9 43.6 40.6  WBC 21.4* 26.9* 24.7*  PLT 714* 790* 760*    COAGULATION Recent Labs  Lab 10/16/21 0946  INR 1.2    CARDIAC  No results for input(s): "TROPONINI" in the last 168 hours. No results for input(s): "PROBNP" in the last 168 hours.   CHEMISTRY Recent Labs  Lab 10/16/21 0946 10/17/21 0456 10/18/21 0454 10/19/21 0458  NA 136 135 134* 135  K 4.0 4.1 3.7 3.6  CL 102 101 99 98  CO2 27 26 24 26   GLUCOSE 115* 129* 115* 102*  BUN 10 13 10 12   CREATININE 1.13 1.17 1.06 1.10  CALCIUM 9.1 8.9 8.7* 9.1  MG  --   --   --  2.0  PHOS  --   --   --  3.2   Estimated Creatinine Clearance: 129.9 mL/min (by C-G formula based on SCr of 1.1  mg/dL).   LIVER Recent Labs  Lab 10/16/21 0946 10/19/21 0458  AST 17 16  ALT 23 19  ALKPHOS 57 73  BILITOT 1.3* 1.6*  PROT 6.4* 7.3  ALBUMIN 2.8* 3.3*  INR 1.2  --      INFECTIOUS Recent Labs  Lab 10/16/21 0956 10/19/21 0458  LATICACIDVEN 1.2  --   PROCALCITON  --  1.13     ENDOCRINE CBG (last 3)  No results for input(s): "GLUCAP" in the last 72 hours.       IMAGING x48h  - image(s) personally visualized  -   highlighted in bold 10/18/21 CHEST (PLEURAL EFFUSION)  Result Date: 10/18/2021 CLINICAL DATA:  36 year old male with right-sided pleural effusion EXAM: CHEST ULTRASOUND COMPARISON:  None Available. FINDINGS: Highly complicated/loculated but small  right-sided pleural effusion. The due to the internal septations, the collapse portion of the right lower lobe is held close to the pleural surface. There is not sufficient space for safe thoracentesis. IMPRESSION: Small a highly complex and loculated right pleural effusion. There is no safe window for thoracentesis. Electronically Signed   By: Malachy Moan M.D.   On: 10/18/2021 13:17

## 2021-10-19 NOTE — Progress Notes (Signed)
PROGRESS NOTE    Todd Pena  IRS:[id number] DOB: 04/21/1985 DOA: 10/16/2021 PCP: Patient, No Pcp Per    Brief Narrative:   Todd Pena is a 36 y.o. male with past medical history significant for essential hypertension, morbid obesity presented to [facility] ED on 8/19 with progressive shortness of breath, cough, subjective fevers, chills, diaphoresis and fatigue.  Patient was recently seen in the ED on 8/16 with chest x-ray findings of a right lower lobe pneumonia and was discharged home with a course of doxycycline.  Patient reports no improvement despite compliance with the antibiotic.  Patient also reported 1 episode of emesis day prior with frequent bowel movements but denies any watery diarrhea.  Denies swelling in his legs, no cramping/pain in his cast.  Denies tobacco/alcohol or illicit drug use.  Reports that he was previously taking medications for high blood pressure but has not continued their use for some time now.  In the ED, temperature 98.2 F, HR 109, RR 22, BP 191/127, SPO2 91% on room air; and patient was placed on 2 L nasal cannula.  WBC 19.9, hemoglobin 14.8, platelets 729.  Sodium 136, potassium 4.0, chloride 102, CO2 27, glucose 115, BUN 10, creatinine 1.13.  AST 17, ALT 23, total bilirubin 1.3.  Lactic acid 1.2.  High sensitive troponin 7.  BNP 82.7.  Urinalysis unrevealing.  COVID-19 PCR negative.  Influenza A/B PCR negative.  Chest x-ray with bilateral lung opacities, increased on the right and stable on the left compared with most recent chest x-ray consistent with pneumonia.  CT angiogram chest PE no evidence of pulmonary embolism although limited study, moderate loculated appearing right pleural effusion and trace left pleural effusion, extensive associated atelectasis or consolidation particularly of the right lung base with more focal opacity in the superior segment right lower lobe consistent with multifocal infection and possible aspiration.  Patient was initially given  2 L NS, IV ceftriaxone and azithromycin.  Due to aspiration he was then given a dose of Zosyn.  TRH consulted for further evaluation and management of multifocal pneumonia, loculated right pleural effusion.  Assessment & Plan:   Sepsis, POA Multifocal pneumonia Patient representing to ED with progressive shortness of breath, productive cough despite outpatient doxycycline.  CT angiogram chest shows multifocal pneumonia, greatest in right lower lobe with associated loculated pleural effusion.  Patient was tachycardic, tachypneic, with SPO2 91% on room air with associated confusion.  WBC count elevated 19.9.  Seen by pulmonology for consideration of chest tube placement, per POC ultrasound 8/20 no good pocket for sampling of fluid and recommended continue IV antibiotics for now.  IR attempted ultrasound-guided thoracentesis 8/21, no window for sampling.  Respiratory viral panel negative.  Sputum culture not acceptable for testing.  MRSA PCR negative.  Strep pneumo urinary antigen negative. --Pulmonology following, appreciate assistance --WBC 19.9>21.4>26.9>24.7 --Blood cultures x2: No growth x 3 days --Urine Legionella: Pending  --Zosyn 3.375 g IV every 8 hours --Mucinex 600 mg p.o. twice daily --Supportive care --CBC daily  Acute metabolic encephalopathy, POA: Resolved Upon arrival to the ED, patient reported that he was confused.  Now started on empiric antibiotics for multifocal pneumonia and mental status appears now to be back at his typical baseline.  Essential hypertension, poorly controlled Blood pressure notably elevated 191/127 on admission.  Was previously on amlodipine but has been noncompliant. --Amlodipine to 10 mg p.o. daily --Increase carvedilol to 25 mg p.o. twice daily --Hydralazine 25 mg PO q8h PRN SBP >170 or DBP >110  Insomnia --  Melatonin 3 mg p.o. nightly --trazodone 100mg  PO qHS PRN  Morbid obesity Body mass index is 40.62 kg/m.  Discussed with patient needs for  aggressive lifestyle changes/weight loss as this complicates all facets of care.  Outpatient follow-up with PCP.    DVT prophylaxis: Refusing chemical DVT prophylaxis    Code Status: Full Code Family Communication: No family present at bedside this morning  Disposition Plan:  Level of care: Telemetry Status is: Inpatient Remains inpatient appropriate because: IV antibiotics    Consultants:  PCCM IR  Procedures:  None  Antimicrobials:  Azithromycin 8/19 - 8/19 Ceftriaxone 8/19 - 8/19 Zosyn 8/19>>   Subjective: Patient seen examined bedside, resting comfortably.  Sitting in bedside chair.  Patient's fianc on speaker phone in room.  States shortness of breath slightly improved, continues coughing up sputum.  Requesting sleep aid for nighttime.  No other specific questions or concerns at this time.  Denies headache, no visual changes, no chest pain, no palpitations, no abdominal pain, no current nausea/vomiting, no focal weakness, no congestion, no paresthesias.  No acute events noted overnight per nurse staff.  Objective: Vitals:   10/18/21 1252 10/18/21 1953 10/19/21 0500 10/19/21 0845  BP: (!) 189/119 (!) 161/111  (!) 148/97  Pulse: (!) 131 (!) 105  (!) 118  Resp: 20 18  20   Temp: 99.9 F (37.7 C) 98.2 F (36.8 C)  99.2 F (37.3 C)  TempSrc: Oral Oral  Oral  SpO2: 92% 97%  96%  Weight:   132.1 kg   Height:        Intake/Output Summary (Last 24 hours) at 10/19/2021 1119 Last data filed at 10/19/2021 1000 Gross per 24 hour  Intake 808.43 ml  Output --  Net 808.43 ml   Filed Weights   10/17/21 0500 10/18/21 0500 10/19/21 0500  Weight: 130 kg 132 kg 132.1 kg    Examination:  Physical Exam: GEN: NAD, alert and oriented x 3, obese HEENT: NCAT, PERRL, EOMI, sclera clear, MMM PULM: Breath sounds diminished right base with crackles, normal respiratory effort without accessory muscle use, no wheezing, on 2 L nasal cannula with SPO2 97% at rest CV: RRR w/o  M/G/R GI: abd soft, NTND, NABS, no R/G/M MSK: no peripheral edema, muscle strength globally intact 5/5 bilateral upper/lower extremities NEURO: CN II-XII intact, no focal deficits, sensation to light touch intact PSYCH: normal mood/affect Integumentary: dry/intact, no rashes or wounds    Data Reviewed: I have personally reviewed following labs and imaging studies  CBC: Recent Labs  Lab 10/16/21 0946 10/17/21 0456 10/18/21 0454 10/19/21 0458  WBC 19.9* 21.4* 26.9* 24.7*  NEUTROABS 15.6*  --   --   --   HGB 14.8 14.5 14.3 13.7  HCT 43.3 43.9 43.6 40.6  MCV 86.6 89.2 89.2 86.9  PLT 729* 714* 790* 760*   Basic Metabolic Panel: Recent Labs  Lab 10/16/21 0946 10/17/21 0456 10/18/21 0454 10/19/21 0458  NA 136 135 134* 135  K 4.0 4.1 3.7 3.6  CL 102 101 99 98  CO2 27 26 24 26   GLUCOSE 115* 129* 115* 102*  BUN 10 13 10 12   CREATININE 1.13 1.17 1.06 1.10  CALCIUM 9.1 8.9 8.7* 9.1  MG  --   --   --  2.0  PHOS  --   --   --  3.2   GFR: Estimated Creatinine Clearance: 129.9 mL/min (by C-G formula based on SCr of 1.1 mg/dL). Liver Function Tests: Recent Labs  Lab 10/16/21 0946 10/19/21 0458  AST 17 16  ALT 23 19  ALKPHOS 57 73  BILITOT 1.3* 1.6*  PROT 6.4* 7.3  ALBUMIN 2.8* 3.3*   No results for input(s): "LIPASE", "AMYLASE" in the last 168 hours. No results for input(s): "AMMONIA" in the last 168 hours. Coagulation Profile: Recent Labs  Lab 10/16/21 0946  INR 1.2   Cardiac Enzymes: No results for input(s): "CKTOTAL", "CKMB", "CKMBINDEX", "TROPONINI" in the last 168 hours. BNP (last 3 results) No results for input(s): "PROBNP" in the last 8760 hours. HbA1C: No results for input(s): "HGBA1C" in the last 72 hours. CBG: No results for input(s): "GLUCAP" in the last 168 hours. Lipid Profile: No results for input(s): "CHOL", "HDL", "LDLCALC", "TRIG", "CHOLHDL", "LDLDIRECT" in the last 72 hours. Thyroid Function Tests: No results for input(s): "TSH", "T4TOTAL",  "FREET4", "T3FREE", "THYROIDAB" in the last 72 hours. Anemia Panel: No results for input(s): "VITAMINB12", "FOLATE", "FERRITIN", "TIBC", "IRON", "RETICCTPCT" in the last 72 hours. Sepsis Labs: Recent Labs  Lab 10/16/21 0956 10/19/21 0458  PROCALCITON  --  1.13  LATICACIDVEN 1.2  --     Recent Results (from the past 240 hour(s))  SARS Coronavirus 2 by RT PCR (hospital order, performed in [facility] hospital lab) *cepheid single result test* Anterior Nasal Swab     Status: None   Collection Time: 10/13/21  8:47 PM   Specimen: Anterior Nasal Swab  Result Value Ref Range Status   SARS Coronavirus 2 by RT PCR NEGATIVE NEGATIVE Final    Comment: (NOTE) SARS-CoV-2 target nucleic acids are NOT DETECTED.  The SARS-CoV-2 RNA is generally detectable in upper and lower respiratory specimens during the acute phase of infection. The lowest concentration of SARS-CoV-2 viral copies this assay can detect is 250 copies / mL. A negative result does not preclude SARS-CoV-2 infection and should not be used as the sole basis for treatment or other patient management decisions.  A negative result may occur with improper specimen collection / handling, submission of specimen other than nasopharyngeal swab, presence of viral mutation(s) within the areas targeted by this assay, and inadequate number of viral copies (<250 copies / mL). A negative result must be combined with clinical observations, patient history, and epidemiological information.  Fact Sheet for Patients:   RoadLapTop.co.za  Fact Sheet for Healthcare Providers: http://kim-miller.com/  This test is not yet approved or  cleared by the [place] FDA and has been authorized for detection and/or diagnosis of SARS-CoV-2 by FDA under an Emergency Use Authorization (EUA).  This EUA will remain in effect (meaning this test can be used) for the duration of the COVID-19 declaration under Section  564(b)(1) of the Act, 21 U.S.C. section 360bbb-3(b)(1), unless the authorization is terminated or revoked sooner.  Performed at [facility], [address] [place], [place] [address]   Blood Culture (routine x 2)     Status: None (Preliminary result)   Collection Time: 10/16/21  9:50 AM   Specimen: Right Antecubital; Blood  Result Value Ref Range Status   Specimen Description   Final    RIGHT ANTECUBITAL BLOOD Performed at [facility], [address] [place], [place] [address]    Special Requests   Final    Blood Culture adequate volume BOTTLES DRAWN AEROBIC AND ANAEROBIC Performed at [facility], [address] [place], [place] [address]    Culture   Final    NO GROWTH 3 DAYS Performed at [facility], [address]  [place], [place] [address]    Report Status PENDING  Incomplete  Blood Culture (routine x 2)     Status: None (Preliminary result)   Collection Time: 10/16/21 10:05 AM   Specimen: Left Antecubital; Blood  Result Value Ref Range Status   Specimen Description   Final    LEFT ANTECUBITAL BLOOD Performed at [facility], [address] [place], [place] [address]    Special Requests   Final    Blood Culture adequate volume BOTTLES DRAWN AEROBIC AND ANAEROBIC Performed at [facility], [address] [place], [place] [address]    Culture   Final    NO GROWTH 3 DAYS Performed at [facility], [address], [place], [place] [address]    Report Status PENDING  Incomplete  Resp Panel by RT-PCR (Flu A&B, Covid) Anterior Nasal Swab     Status: None   Collection Time: 10/16/21 12:13 PM   Specimen: Anterior Nasal Swab  Result Value Ref Range Status   SARS Coronavirus 2 by RT PCR NEGATIVE NEGATIVE Final    Comment: (NOTE) SARS-CoV-2 target nucleic acids are NOT DETECTED.  The SARS-CoV-2 RNA is generally detectable in upper respiratory specimens during the acute phase of infection. The  lowest concentration of SARS-CoV-2 viral copies this assay can detect is 138 copies/mL. A negative result does not preclude SARS-Cov-2 infection and should not be used as the sole basis for treatment or other patient management decisions. A negative result may occur with  improper specimen collection/handling, submission of specimen other than nasopharyngeal swab, presence of viral mutation(s) within the areas targeted by this assay, and inadequate number of viral copies(<138 copies/mL). A negative result must be combined with clinical observations, patient history, and epidemiological information. The expected result is Negative.  Fact Sheet for Patients:  BloggerCourse.com  Fact Sheet for Healthcare Providers:  SeriousBroker.it  This test is no t yet approved or cleared by the [place] FDA and  has been authorized for detection and/or diagnosis of SARS-CoV-2 by FDA under an Emergency Use Authorization (EUA). This EUA will remain  in effect (meaning this test can be used) for the duration of the COVID-19 declaration under Section 564(b)(1) of the Act, 21 U.S.C.section 360bbb-3(b)(1), unless the authorization is terminated  or revoked sooner.       Influenza A by PCR NEGATIVE NEGATIVE Final   Influenza B by PCR NEGATIVE NEGATIVE Final    Comment: (NOTE) The Xpert Xpress SARS-CoV-2/FLU/RSV plus assay is intended as an aid in the diagnosis of influenza from Nasopharyngeal swab specimens and should not be used as a sole basis for treatment. Nasal washings and aspirates are unacceptable for Xpert Xpress SARS-CoV-2/FLU/RSV testing.  Fact Sheet for Patients: BloggerCourse.com  Fact Sheet for Healthcare Providers: SeriousBroker.it  This test is not yet approved or cleared by the [place] FDA and has been authorized for detection and/or diagnosis of SARS-CoV-2 by FDA under  an Emergency Use Authorization (EUA). This EUA will remain in effect (meaning this test can be used) for the duration of the COVID-19 declaration under Section 564(b)(1) of the Act, 21 U.S.C. section 360bbb-3(b)(1), unless the authorization is terminated or revoked.  Performed at [facility], [address] [place], [place] [address]   Expectorated Sputum Assessment w Gram Stain, Rflx to Resp Cult     Status: None   Collection Time: 10/16/21 12:17 PM   Specimen: Sputum  Result Value Ref Range Status   Specimen  Description SPUTUM  Final   Special Requests NONE  Final   Sputum evaluation   Final    Sputum specimen not acceptable for testing.  Please recollect.   Performed at [facility], [address] [place], [place] [address]    Report Status 10/18/2021 FINAL  Final  MRSA Next Gen by PCR, Nasal     Status: None   Collection Time: 10/17/21  1:53 PM   Specimen: Nasal Mucosa; Nasal Swab  Result Value Ref Range Status   MRSA by PCR Next Gen NOT DETECTED NOT DETECTED Final    Comment: (NOTE) The GeneXpert MRSA Assay (FDA approved for NASAL specimens only), is one component of a comprehensive MRSA colonization surveillance program. It is not intended to diagnose MRSA infection nor to guide or monitor treatment for MRSA infections. Test performance is not FDA approved in patients less than 27 years old. Performed at [facility], [address] [place], [place] [address]   Respiratory (~20 pathogens) panel by PCR     Status: None   Collection Time: 10/18/21 12:53 PM   Specimen: Nasopharyngeal Swab; Respiratory  Result Value Ref Range Status   Adenovirus NOT DETECTED NOT DETECTED Final   Coronavirus 229E NOT DETECTED NOT DETECTED Final    Comment: (NOTE) The Coronavirus on the Respiratory Panel, DOES NOT test for the novel  Coronavirus (2019 nCoV)    Coronavirus HKU1 NOT DETECTED NOT DETECTED Final   Coronavirus NL63 NOT  DETECTED NOT DETECTED Final   Coronavirus OC43 NOT DETECTED NOT DETECTED Final   Metapneumovirus NOT DETECTED NOT DETECTED Final   Rhinovirus / Enterovirus NOT DETECTED NOT DETECTED Final   Influenza A NOT DETECTED NOT DETECTED Final   Influenza B NOT DETECTED NOT DETECTED Final   Parainfluenza Virus 1 NOT DETECTED NOT DETECTED Final   Parainfluenza Virus 2 NOT DETECTED NOT DETECTED Final   Parainfluenza Virus 3 NOT DETECTED NOT DETECTED Final   Parainfluenza Virus 4 NOT DETECTED NOT DETECTED Final   Respiratory Syncytial Virus NOT DETECTED NOT DETECTED Final   Bordetella pertussis NOT DETECTED NOT DETECTED Final   Bordetella Parapertussis NOT DETECTED NOT DETECTED Final   Chlamydophila pneumoniae NOT DETECTED NOT DETECTED Final   Mycoplasma pneumoniae NOT DETECTED NOT DETECTED Final    Comment: Performed at [facility], [address] [place], [place] [address]         Radiology Studies: [place] CHEST (PLEURAL EFFUSION)  Result Date: 10/18/2021 CLINICAL DATA:  36 year old male with right-sided pleural effusion EXAM: CHEST ULTRASOUND COMPARISON:  None Available. FINDINGS: Highly complicated/loculated but small right-sided pleural effusion. The due to the internal septations, the collapse portion of the right lower lobe is held close to the pleural surface. There is not sufficient space for safe thoracentesis. IMPRESSION: Small a highly complex and loculated right pleural effusion. There is no safe window for thoracentesis. Electronically Signed   By: Malachy Moan M.D.   On: 10/18/2021 13:17        Scheduled Meds:  amLODipine  10 mg Oral Daily   carvedilol  25 mg Oral BID WC   guaiFENesin  600 mg Oral BID   Continuous Infusions:  piperacillin-tazobactam (ZOSYN)  IV 3.375 g (10/19/21 0458)     LOS: 3 days    Time spent: 48 minutes spent on chart review, discussion with nursing staff, consultants, updating family and interview/physical exam; more than 50% of that time  was spent in counseling and/or coordination of care.  Alvira PhilipsEric J [place], DO [facility] Available via Epic secure chat 7am-7pm After these hours, please refer to coverage provider listed on [email or web address] 10/19/2021, 11:19 AM

## 2021-10-19 NOTE — Progress Notes (Signed)
   10/19/21 0845  Assess: MEWS Score  Temp 99.2 F (37.3 C)  BP (!) 148/97  MAP (mmHg) 114  Pulse Rate (!) 118  Resp 20  SpO2 96 %  O2 Device Nasal Cannula  O2 Flow Rate (L/min) 2.5 L/min  Assess: MEWS Score  MEWS Temp 0  MEWS Systolic 0  MEWS Pulse 2  MEWS RR 0  MEWS LOC 0  MEWS Score 2  MEWS Score Color Yellow  Assess: if the MEWS score is Yellow or Red  Were vital signs taken at a resting state? Yes  Focused Assessment No change from prior assessment  Does the patient meet 2 or more of the SIRS criteria? Yes  Does the patient have a confirmed or suspected source of infection? Yes  Provider and Rapid Response Notified? No  MEWS guidelines implemented *See Row Information* No, previously yellow, continue vital signs every 4 hours  Notify: Charge Nurse/RN  Name of Charge Nurse/RN Notified Benjaman Lobe, RN  Date Charge Nurse/RN Notified 10/19/21  Time Charge Nurse/RN Notified 1023  Assess: SIRS CRITERIA  SIRS Temperature  0  SIRS Pulse 1  SIRS Respirations  0  SIRS WBC 1  SIRS Score Sum  2

## 2021-10-19 NOTE — Plan of Care (Signed)
  Problem: Education: Goal: Knowledge of General Education information will improve Description: Including pain rating scale, medication(s)/side effects and non-pharmacologic comfort measures Outcome: Progressing   Problem: Clinical Measurements: Goal: Ability to maintain clinical measurements within normal limits will improve Outcome: Progressing Goal: Will remain free from infection Outcome: Progressing Goal: Diagnostic test results will improve Outcome: Progressing Goal: Respiratory complications will improve Outcome: Progressing Goal: Cardiovascular complication will be avoided Outcome: Progressing   Problem: Activity: Goal: Risk for activity intolerance will decrease Outcome: Progressing   Problem: Nutrition: Goal: Adequate nutrition will be maintained Outcome: Progressing   Problem: Coping: Goal: Level of anxiety will decrease Outcome: Progressing   Problem: Elimination: Goal: Will not experience complications related to bowel motility Outcome: Progressing Goal: Will not experience complications related to urinary retention Outcome: Progressing   Problem: Pain Managment: Goal: General experience of comfort will improve Outcome: Progressing   Problem: Safety: Goal: Ability to remain free from injury will improve Outcome: Progressing   Problem: Skin Integrity: Goal: Risk for impaired skin integrity will decrease Outcome: Progressing   Problem: Activity: Goal: Ability to tolerate increased activity will improve Outcome: Progressing   Problem: Clinical Measurements: Goal: Ability to maintain a body temperature in the normal range will improve Outcome: Progressing   Problem: Respiratory: Goal: Ability to maintain adequate ventilation will improve Outcome: Progressing Goal: Ability to maintain a clear airway will improve Outcome: Progressing

## 2021-10-20 ENCOUNTER — Inpatient Hospital Stay (HOSPITAL_COMMUNITY): Payer: Self-pay

## 2021-10-20 DIAGNOSIS — R9431 Abnormal electrocardiogram [ECG] [EKG]: Secondary | ICD-10-CM

## 2021-10-20 DIAGNOSIS — M7989 Other specified soft tissue disorders: Secondary | ICD-10-CM

## 2021-10-20 LAB — BASIC METABOLIC PANEL
Anion gap: 11 (ref 5–15)
BUN: 13 mg/dL (ref 6–20)
CO2: 25 mmol/L (ref 22–32)
Calcium: 8.5 mg/dL — ABNORMAL LOW (ref 8.9–10.3)
Chloride: 95 mmol/L — ABNORMAL LOW (ref 98–111)
Creatinine, Ser: 1.08 mg/dL (ref 0.61–1.24)
GFR, Estimated: >60 mL/min (ref >60)
Glucose, Bld: 112 mg/dL — ABNORMAL HIGH (ref 70–99)
Potassium: 3.3 mmol/L — ABNORMAL LOW (ref 3.5–5.1)
Sodium: 131 mmol/L — ABNORMAL LOW (ref 135–145)

## 2021-10-20 LAB — CBC
HCT: 38.7 % — ABNORMAL LOW (ref 39.0–52.0)
Hemoglobin: 12.8 g/dL — ABNORMAL LOW (ref 13.0–17.0)
MCH: 28.8 pg (ref 26.0–34.0)
MCHC: 33.1 g/dL (ref 30.0–36.0)
MCV: 87.2 fL (ref 80.0–100.0)
Platelets: 816 10*3/uL — ABNORMAL HIGH (ref 150–400)
RBC: 4.44 MIL/uL (ref 4.22–5.81)
RDW: 12.7 % (ref 11.5–15.5)
WBC: 24.3 10*3/uL — ABNORMAL HIGH (ref 4.0–10.5)
nRBC: 0 % (ref 0.0–0.2)

## 2021-10-20 LAB — ECHOCARDIOGRAM COMPLETE
AR max vel: 4.22 cm2
AV Area VTI: 4.56 cm2
AV Area mean vel: 4.2 cm2
AV Mean grad: 2 mmHg
AV Peak grad: 4.5 mmHg
Ao pk vel: 1.06 m/s
Area-P 1/2: 4.86 cm2
Height: 71 in
S' Lateral: 3.2 cm
Weight: 4656.12 oz

## 2021-10-20 LAB — MAGNESIUM: Magnesium: 2.1 mg/dL (ref 1.7–2.4)

## 2021-10-20 MED ORDER — GUAIFENESIN ER 600 MG PO TB12
1200.0000 mg | ORAL_TABLET | Freq: Two times a day (BID) | ORAL | Status: DC
  Administered 2021-10-20 – 2021-10-30 (×20): 1200 mg via ORAL
  Filled 2021-10-20 (×22): qty 2

## 2021-10-20 MED ORDER — CEFDINIR 300 MG PO CAPS
300.0000 mg | ORAL_CAPSULE | Freq: Two times a day (BID) | ORAL | Status: DC
  Administered 2021-10-20 (×2): 300 mg via ORAL
  Filled 2021-10-20 (×3): qty 1

## 2021-10-20 MED ORDER — GUAIFENESIN-DM 100-10 MG/5ML PO SYRP
5.0000 mL | ORAL_SOLUTION | ORAL | Status: DC | PRN
  Administered 2021-10-21 – 2021-10-26 (×5): 5 mL via ORAL
  Filled 2021-10-20 (×4): qty 10
  Filled 2021-10-20: qty 5

## 2021-10-20 MED ORDER — METRONIDAZOLE 500 MG PO TABS
500.0000 mg | ORAL_TABLET | Freq: Two times a day (BID) | ORAL | Status: DC
  Administered 2021-10-20 – 2021-10-21 (×3): 500 mg via ORAL
  Filled 2021-10-20 (×3): qty 1

## 2021-10-20 MED ORDER — IPRATROPIUM-ALBUTEROL 0.5-2.5 (3) MG/3ML IN SOLN
3.0000 mL | Freq: Four times a day (QID) | RESPIRATORY_TRACT | Status: DC
Start: 2021-10-20 — End: 2021-10-22
  Administered 2021-10-20 – 2021-10-22 (×6): 3 mL via RESPIRATORY_TRACT
  Filled 2021-10-20 (×8): qty 3

## 2021-10-20 MED ORDER — ENOXAPARIN SODIUM 60 MG/0.6ML IJ SOSY
60.0000 mg | PREFILLED_SYRINGE | INTRAMUSCULAR | Status: DC
  Administered 2021-10-20: 60 mg via SUBCUTANEOUS
  Filled 2021-10-20: qty 0.6

## 2021-10-20 NOTE — Progress Notes (Signed)
   Repeat CXR visualized  The right effusion is now significant and worse  Plan  - [place] thora - IR guided pigtail in anticipation of drainage +/- IP lyitcs      SIGNATURE    Dr. Kalman Shan, M.D., F.C.C.P,  Pulmonary and Critical Care Medicine Staff Physician, [facility] Center Director - Interstitial Lung Disease  Program  Medical Director - [facility] Long ICU Pulmonary Fibrosis [facility] [place], [place], [address]  NPI Number:  NPI #[id number] [facility] Number: [id number]  Pager: [phone], If no answer  -> Check AMION or Try [phone] Telephone (clinical office): [phone] Telephone (research): [phone]  4:47 PM 10/20/2021

## 2021-10-20 NOTE — Progress Notes (Signed)
Dr. Marchelle Gearing removed patient's nasal cannula and instructed nursing staff to check saturations in about 15 mins.  Patient's O2 on RA was 83% at rest. Reapplied O2 2L, patient able to recover within 5 minutes, saturations up to 95%. Dr. Marchelle Gearing updated.

## 2021-10-20 NOTE — Progress Notes (Signed)
NAME:  Todd Pena, MRN:  [id number], DOB:  [birth year], LOS: 4 ADMISSION DATE:  10/16/2021, CONSULTATION DATE: 10/17/2021 REFERRING MD: Dr. [place], CHIEF COMPLAINT: Loculated effusion, multilobar pneumonia  bRIEF  Patient admitted with worsening shortness of breath Recent diagnosis of pneumonia for which she was treated with a course of doxycycline, symptoms did progress leading to presentation to the emergency department Has had about a week of feeling unwell with exertional dyspnea cough, mild hemoptysis Subjective fevers chills diaphoresis Decreased exercise tolerance  Did not see any significant improvement in his symptoms despite completing course of antibiotics, there was associated diarrhea with this antibiotic use  Denies any chest pains or chest discomfort  Background history of hypertension  Pertinent  Medical History   Past Medical History:  Diagnosis Date   Hypertension     Significant Hospital Events: Including procedures, antibiotic start and stop dates in addition to other pertinent events   CT chest with loculated effusion, multilobar infiltrate- CCM [place] 0-not enough fluid to tap on right side 8/22 - TMax 101 yesterday. AFebrile since then. WBC high. On abx. On 2L [place]. He nis not sure if he is better. IR done [place] -> small loculated effusion with fluid behind scapula. Procdure could not be done due to size and safety 8/22 - fever curve better. WBC better. Feels better. Looks better. Took shower with o2. Culure negative so far  Interim History / Subjective:    10/20/2021 - low grade fever +. WBC still high but some better. STill on 2L [place]. Says he is better  Objective   Blood pressure (!) 147/106, pulse (!) 106, temperature 98.3 F (36.8 C), temperature source Oral, resp. rate 18, height 5\' 11"  (1.803 m), weight 132 kg, SpO2 97 %.        Intake/Output Summary (Last 24 hours) at 10/20/2021 0830 Last data filed at 10/20/2021 0654 Gross per 24 hour  Intake 498.74  ml  Output --  Net 498.74 ml   Filed Weights   10/18/21 0500 10/19/21 0500 10/20/21 0457  Weight: 132 kg 132.1 kg 132 kg    Examination: General: No distress. Siting edge of bed like yesterday Neuro: Alert and Oriented x 3. GCS 15. Speech normal Psych: Pleasant Resp:  Barrel Chest - no.  Wheeze - no, Crackles - no. RIT BASE AIR ENTRY DOWN, No overt respiratory distress CVS: Normal heart sounds. Murmurs - no Ext: Stigmata of Connective Tissue Disease - no HEENT: Normal upper airway. PEERL +. No post nasal drip    Resolved Hospital Problem list     Assessment & Plan:  Hypoxemic respiratory failure Respiratory failure secondary to multilobar pneumonia and RLL small loculated effuion Hemoptysis related to pneumonia -Failed outpatient treatment with doxycycline - culture and pcr neg  10/20/2021 - clinicaly better overall but low grade fever. Effusion too small to tap up until 2 days ago   Plan - keep pusle ox  >92% - retest toda on room air with exertional pulse ox (d/w beside nurse to get this done) - get CXR 2 view - aim for dc  home 10/21/21 +/- with o2 -   Cefdinir 300mg  bid and Flagyl 500mg  bid x 2 weeks; both oral - ordered 10/20/2021  Future Appointments  Date Time Provider Department Center  10/28/2021  9:00 AM Parrett, , NP [facility] None       Anti-infectives (From admission, onward)    Start     Dose/Rate Route Frequency Ordered Stop   10/20/21 1000  cefdinir (OMNICEF) capsule 300 mg        300 mg Oral Every 12 hours 10/20/21 0835     10/20/21 1000  metroNIDAZOLE (FLAGYL) tablet 500 mg        500 mg Oral 2 times daily 10/20/21 0835     10/16/21 2100  piperacillin-tazobactam (ZOSYN) IVPB 3.375 g  Status:  Discontinued        3.375 g 12.5 mL/hr over 240 Minutes Intravenous Every 8 hours 10/16/21 1912 10/20/21 0835   10/16/21 1400  piperacillin-tazobactam (ZOSYN) IVPB 3.375 g        3.375 g 12.5 mL/hr over 240 Minutes Intravenous  Once 10/16/21  1347 10/16/21 1735   10/16/21 1045  cefTRIAXone (ROCEPHIN) 2 g in sodium chloride 0.9 % 100 mL IVPB  Status:  Discontinued        2 g 200 mL/hr over 30 Minutes Intravenous Every 24 hours 10/16/21 1036 10/16/21 1902   10/16/21 1045  azithromycin (ZITHROMAX) 500 mg in sodium chloride 0.9 % 250 mL IVPB  Status:  Discontinued        500 mg 250 mL/hr over 60 Minutes Intravenous Every 24 hours 10/16/21 1036 10/16/21 1902        Best Practice (right click and "Reselect all SmartList Selections" daily)   Diet/type: Regular consistency (see orders) DVT prophylaxis: prophylactic heparin  GI prophylaxis: N/A Lines: N/A Foley:  N/A Code Status:  full code Last date of multidisciplinary goals of care discussion [Per primary  Followup Future Appointments  Date Time Provider Department Center  10/28/2021  9:00 AM Parrett, Virgel Bouquet, NP [facility] None   l    SIGNATURE    Dr. Kalman Shan, M.D., F.C.C.P,  Pulmonary and Critical Care Medicine Staff Physician, [facility] Center Director - Interstitial Lung Disease  Program  Medical Director - [facility] Pulmonary Fibrosis [facility] [place], [place], [address]  NPI Number:  NPI #[id number] DEA Number: [id number]  Pager: [phone], If no answer  -> Check AMION or Try [phone] Telephone (clinical office): [phone] Telephone (research): [phone]  8:30 AM 10/20/2021   LABS    PULMONARY No results for input(s): "PHART", "PCO2ART", "PO2ART", "HCO3", "TCO2", "O2SAT" in the last 168 hours.  Invalid input(s): "PCO2", "PO2"  CBC Recent Labs  Lab 10/18/21 0454 10/19/21 0458 10/20/21 0530  HGB 14.3 13.7 12.8*  HCT 43.6 40.6 38.7*  WBC 26.9* 24.7* 24.3*  PLT 790* 760* 816*    COAGULATION Recent Labs  Lab 10/16/21 0946  INR 1.2    CARDIAC  No results for input(s): "TROPONINI" in the last 168 hours. No results for input(s): "PROBNP" in the last 168  hours.   CHEMISTRY Recent Labs  Lab 10/16/21 0946 10/17/21 0456 10/18/21 0454 10/19/21 0458 10/20/21 0530  NA 136 135 134* 135 131*  K 4.0 4.1 3.7 3.6 3.3*  CL 102 101 99 98 95*  CO2 27 26 24 26 25   GLUCOSE 115* 129* 115* 102* 112*  BUN 10 13 10 12 13   CREATININE 1.13 1.17 1.06 1.10 1.08  CALCIUM 9.1 8.9 8.7* 9.1 8.5*  MG  --   --   --  2.0 2.1  PHOS  --   --   --  3.2  --    Estimated Creatinine Clearance: 132.3 mL/min (by C-G formula based on SCr of 1.08 mg/dL).   LIVER Recent Labs  Lab 10/16/21 0946 10/19/21 0458  AST 17  16  ALT 23 19  ALKPHOS 57 73  BILITOT 1.3* 1.6*  PROT 6.4* 7.3  ALBUMIN 2.8* 3.3*  INR 1.2  --      INFECTIOUS Recent Labs  Lab 10/16/21 0956 10/19/21 0458  LATICACIDVEN 1.2  --   PROCALCITON  --  1.13     ENDOCRINE CBG (last 3)  No results for input(s): "GLUCAP" in the last 72 hours.       IMAGING x48h  - image(s) personally visualized  -   highlighted in bold [place] CHEST (PLEURAL EFFUSION)  Result Date: 10/18/2021 CLINICAL DATA:  36 year old male with right-sided pleural effusion EXAM: CHEST ULTRASOUND COMPARISON:  None Available. FINDINGS: Highly complicated/loculated but small right-sided pleural effusion. The due to the internal septations, the collapse portion of the right lower lobe is held close to the pleural surface. There is not sufficient space for safe thoracentesis. IMPRESSION: Small a highly complex and loculated right pleural effusion. There is no safe window for thoracentesis. Electronically Signed   By: Malachy Moan M.D.   On: 10/18/2021 13:17

## 2021-10-20 NOTE — Progress Notes (Signed)
PROGRESS NOTE  Todd Pena  DOB: [birth year]  PCP: Patient, No Pcp Per SAY:[id number]  DOA: 10/16/2021  LOS: 4 days  Hospital Day: 5  Brief narrative: Todd Pena is a 36 y.o. male with PMH significant for morbid obesity, hypertension 8/16, patient presented to the ED with complaint of cough, chest pain.  Chest x-ray showed right lower lobe pneumonia and he was discharged home with a course of doxycycline.  Despite compliance to the antibiotic, patient did not improve and he returned to ED on 8/19.  Also reported vomiting and watery diarrhea.  In the ED, he was afebrile, heart rate and blood pressure elevated, he required 2 L oxygen by nasal cannula WBC count was elevated to 19.9, lactic acid normal COVID-19 PCR negative.  Influenza A/B PCR negative.   Chest x-ray compared to previous x-ray showed bilateral lung opacities, increased on the right and stable on the left  CTPA did not show any pulmonary embolism but showed moderate loculated appearing right pleural effusion and trace left pleural effusion, extensive associated atelectasis or consolidation particularly of the right lung base with more focal opacity in the superior segment right lower lobe consistent with multifocal infection and possible aspiration.  Patient was started on IV fluids, IV antibiotics  Admitted to hospitalist service  See below for details.  Subjective: Patient was seen and examined this morning.  Pleasant young African-American male.  Remains on 2 L oxygen by nasal cannula while at rest.  Coughs on deep breathing.  Also has trace bilateral pedal edema. Chart reviewed Last 24 hours, Tmax 100.3 yesterday evening, heart rate in the 90s and low 100s, requiring 2 to 3 L oxygen by nasal cannula, blood pressure in 140s  Assessment and plan: Sepsis, POA Multifocal pneumonia Presented with pneumonia not responding to doxycycline as an outpatient  CT scan with multifocal pneumonia greatest in right lower lobe  with associated loculated pleural effusion.  PCCM and IR were consulted for possible need of chest tube placement.  8/21, ultrasound did not show adequate fluid pocket for pleural fluid drainage. Initially started on IV Zosyn.  Blood culture negative.  Strep pneumo antigen negative.  Anti-Rx subsequently switched to oral cefdinir and oral Flagyl.  Planned for total of 2 weeks Continues to have low-grade fever and leukocytosis.  Repeat x-ray ordered by pulmonary Dr. Monica Becton today. Continue Mucinex, incentive spirometry Recent Labs  Lab 10/16/21 0946 10/16/21 0956 10/17/21 0456 10/18/21 0454 10/19/21 0458 10/20/21 0530  WBC 19.9*  --  21.4* 26.9* 24.7* 24.3*  LATICACIDVEN  --  1.2  --   --   --   --   PROCALCITON  --   --   --   --  1.13  --    Acute respiratory failure with hypoxia Remains on 2 to 3 L oxygen by nasal cannula Check ambulatory oxygen requirement I would also obtain DVT scan of both legs as well as echocardiogram to rule out CHF specially because his recovery is slower than expected and he has trace bilateral pedal edema.  Hemoptysis Related to pneumonia Continue to monitor   Acute metabolic encephalopathy, POA Confusion on admission probably due to pneumonia.   mental status appears now to be back at his typical baseline.   Essential hypertension, poorly controlled Blood pressure notably elevated 191/127 on admission.  Was previously on amlodipine but has been noncompliant. This hospitalization, he was initiated on multiple blood pressure meds.  Gradually improving blood pressure.   Continue Coreg 25 mg twice  daily, amlodipine to 10 mg p.o. daily, hydralazine 25 mg PO q8h PRN SBP >170 or DBP >110   Insomnia Melatonin 3 mg p.o. nightly trazodone 100mg  PO qHS PRN   Morbid obesity  -Body mass index is 40.59 kg/m. Patient has been advised to make an attempt to improve diet and exercise patterns to aid in weight loss.  Goals of care   Code Status: Full Code     Mobility: Encourage ambulation  Skin assessment:     Nutritional status:  Body mass index is 40.59 kg/m.          Diet:  Diet Order             Diet Heart Room service appropriate? Yes; Fluid consistency: Thin  Diet effective now                   DVT prophylaxis: Start Lovenox subcu enoxaparin (LOVENOX) injection 40 mg Start: 10/20/21 1415   Antimicrobials: Cefdinir, Flagyl Fluid: None Consultants: Pulmonology Family Communication: None at bedside  Status is: Inpatient  Continue in-hospital care because: Continues to be short of breath Level of care: Telemetry   Dispo: The patient is from: Home              Anticipated d/c is to: Home in 1 to 2 days              Patient currently is not medically stable to d/c.   Difficult to place patient No     Infusions:    Scheduled Meds:  amLODipine  10 mg Oral Daily   carvedilol  25 mg Oral BID WC   cefdinir  300 mg Oral Q12H   enoxaparin (LOVENOX) injection  40 mg Subcutaneous Q24H   guaiFENesin  1,200 mg Oral BID   ipratropium-albuterol  3 mL Nebulization Q6H WA   melatonin  3 mg Oral QHS   metroNIDAZOLE  500 mg Oral BID    PRN meds: acetaminophen, hydrALAZINE, HYDROcodone bit-homatropine, HYDROmorphone (DILAUDID) injection, ipratropium-albuterol, ondansetron **OR** ondansetron (ZOFRAN) IV, oxyCODONE, senna-docusate, traZODone   Antimicrobials: Anti-infectives (From admission, onward)    Start     Dose/Rate Route Frequency Ordered Stop   10/20/21 1000  cefdinir (OMNICEF) capsule 300 mg        300 mg Oral Every 12 hours 10/20/21 0835     10/20/21 1000  metroNIDAZOLE (FLAGYL) tablet 500 mg        500 mg Oral 2 times daily 10/20/21 0835     10/16/21 2100  piperacillin-tazobactam (ZOSYN) IVPB 3.375 g  Status:  Discontinued        3.375 g 12.5 mL/hr over 240 Minutes Intravenous Every 8 hours 10/16/21 1912 10/20/21 0835   10/16/21 1400  piperacillin-tazobactam (ZOSYN) IVPB 3.375 g        3.375  g 12.5 mL/hr over 240 Minutes Intravenous  Once 10/16/21 1347 10/16/21 1735   10/16/21 1045  cefTRIAXone (ROCEPHIN) 2 g in sodium chloride 0.9 % 100 mL IVPB  Status:  Discontinued        2 g 200 mL/hr over 30 Minutes Intravenous Every 24 hours 10/16/21 1036 10/16/21 1902   10/16/21 1045  azithromycin (ZITHROMAX) 500 mg in sodium chloride 0.9 % 250 mL IVPB  Status:  Discontinued        500 mg 250 mL/hr over 60 Minutes Intravenous Every 24 hours 10/16/21 1036 10/16/21 1902       Objective: Vitals:   10/20/21 0855 10/20/21 1314  BP:  (!) 138/98  Pulse: 97 (!) 103  Resp:  19  Temp:  100 F (37.8 C)  SpO2: 94% 93%    Intake/Output Summary (Last 24 hours) at 10/20/2021 1328 Last data filed at 10/20/2021 0900 Gross per 24 hour  Intake 448.74 ml  Output --  Net 448.74 ml   Filed Weights   10/18/21 0500 10/19/21 0500 10/20/21 0457  Weight: 132 kg 132.1 kg 132 kg   Weight change: -0.1 kg Body mass index is 40.59 kg/m.   Physical Exam: General exam: Pleasant, young African-American male.  On 2 L oxygen by nasal at rest Skin: No rashes, lesions or ulcers. HEENT: Atraumatic, normocephalic, no obvious bleeding Lungs: No crackles or wheezing, diminished air entry in both bases CVS: Regular rate and rhythm, no murmur GI/Abd soft, nontender, nondistended, bowel sound present CNS: Alert, awake, oriented x3 Psychiatry: Mood appropriate Extremities: Pedal edema trace bilaterally  Data Review: I have personally reviewed the laboratory data and studies available.  F/u labs ordered Unresulted Labs (From admission, onward)     Start     Ordered   10/16/21 1924  Strep pneumoniae urinary antigen  (COPD / Pneumonia / Cellulitis / Lower Extremity Wound)  Once,   R        10/16/21 1925   10/16/21 1924  Legionella Pneumophila Serogp 1 Ur Ag  (COPD / Pneumonia / Cellulitis / Lower Extremity Wound)  Once,   R        10/16/21 1925            Signed, Lorin Glass, MD [facility] 10/20/2021

## 2021-10-20 NOTE — Progress Notes (Signed)
Bilateral lower extremity venous duplex has been completed. Preliminary results can be found in CV Proc through chart review.   10/20/21 2:02 PM Olen Cordial RVT

## 2021-10-21 ENCOUNTER — Inpatient Hospital Stay (HOSPITAL_COMMUNITY): Payer: Self-pay

## 2021-10-21 LAB — CULTURE, BLOOD (ROUTINE X 2)
Culture: NO GROWTH
Culture: NO GROWTH
Special Requests: ADEQUATE
Special Requests: ADEQUATE

## 2021-10-21 LAB — BASIC METABOLIC PANEL
Anion gap: 12 (ref 5–15)
BUN: 9 mg/dL (ref 6–20)
CO2: 30 mmol/L (ref 22–32)
Calcium: 8.9 mg/dL (ref 8.9–10.3)
Chloride: 99 mmol/L (ref 98–111)
Creatinine, Ser: 1.12 mg/dL (ref 0.61–1.24)
GFR, Estimated: >60 mL/min (ref >60)
Glucose, Bld: 108 mg/dL — ABNORMAL HIGH (ref 70–99)
Potassium: 3.2 mmol/L — ABNORMAL LOW (ref 3.5–5.1)
Sodium: 141 mmol/L (ref 135–145)

## 2021-10-21 LAB — BODY FLUID CELL COUNT WITH DIFFERENTIAL
Eos, Fluid: 0 %
Lymphs, Fluid: 5 %
Monocyte-Macrophage-Serous Fluid: 2 % — ABNORMAL LOW (ref 50–90)
Neutrophil Count, Fluid: 93 % — ABNORMAL HIGH (ref 0–25)
Total Nucleated Cell Count, Fluid: 637 cu mm (ref 0–1000)

## 2021-10-21 LAB — CBC WITH DIFFERENTIAL/PLATELET
Abs Immature Granulocytes: 0.81 10*3/uL — ABNORMAL HIGH (ref 0.00–0.07)
Basophils Absolute: 0.2 10*3/uL — ABNORMAL HIGH (ref 0.0–0.1)
Basophils Relative: 1 %
Eosinophils Absolute: 0.3 10*3/uL (ref 0.0–0.5)
Eosinophils Relative: 1 %
HCT: 43.2 % (ref 39.0–52.0)
Hemoglobin: 14.1 g/dL (ref 13.0–17.0)
Immature Granulocytes: 4 %
Lymphocytes Relative: 8 %
Lymphs Abs: 1.8 10*3/uL (ref 0.7–4.0)
MCH: 28.7 pg (ref 26.0–34.0)
MCHC: 32.6 g/dL (ref 30.0–36.0)
MCV: 88 fL (ref 80.0–100.0)
Monocytes Absolute: 2.4 10*3/uL — ABNORMAL HIGH (ref 0.1–1.0)
Monocytes Relative: 11 %
Neutro Abs: 16.2 10*3/uL — ABNORMAL HIGH (ref 1.7–7.7)
Neutrophils Relative %: 75 %
Platelets: 898 10*3/uL — ABNORMAL HIGH (ref 150–400)
RBC: 4.91 MIL/uL (ref 4.22–5.81)
RDW: 12.9 % (ref 11.5–15.5)
WBC: 21.7 10*3/uL — ABNORMAL HIGH (ref 4.0–10.5)
nRBC: 0 % (ref 0.0–0.2)

## 2021-10-21 LAB — PROCALCITONIN: Procalcitonin: 0.71 ng/mL

## 2021-10-21 MED ORDER — SODIUM CHLORIDE 0.9 % IV SOLN
2.0000 g | Freq: Three times a day (TID) | INTRAVENOUS | Status: DC
  Administered 2021-10-21 – 2021-10-30 (×27): 2 g via INTRAVENOUS
  Filled 2021-10-21 (×28): qty 12.5

## 2021-10-21 MED ORDER — FENTANYL CITRATE (PF) 100 MCG/2ML IJ SOLN
INTRAMUSCULAR | Status: AC
  Administered 2021-10-21 (×3): 50 ug
  Filled 2021-10-21: qty 4

## 2021-10-21 MED ORDER — MIDAZOLAM HCL 2 MG/2ML IJ SOLN
INTRAMUSCULAR | Status: AC
  Administered 2021-10-21 (×2): 1 mg
  Filled 2021-10-21: qty 4

## 2021-10-21 MED ORDER — ENOXAPARIN SODIUM 60 MG/0.6ML IJ SOSY
60.0000 mg | PREFILLED_SYRINGE | INTRAMUSCULAR | Status: DC
  Administered 2021-10-22 – 2021-10-28 (×6): 60 mg via SUBCUTANEOUS
  Filled 2021-10-21 (×7): qty 0.6

## 2021-10-21 NOTE — Procedures (Signed)
Interventional Radiology Procedure Note  Procedure: Placement of 54F right sided chest tube  Complications: None  Estimated Blood Loss: None  Recommendations: - Chest tube to LWS - PCCM to manage tPA dwells   Signed,  Sterling Big, MD

## 2021-10-21 NOTE — Progress Notes (Signed)
2 specimen bottles of pleural fluid handed to the lab for analysis.

## 2021-10-21 NOTE — Progress Notes (Signed)
PROGRESS NOTE  Todd Pena  DOB: 02/20/1986  PCP: Patient, No Pcp Per ZDG:[id number]  DOA: 10/16/2021  LOS: 5 days  Hospital Day: 6  Brief narrative: Todd Pena is a 36 y.o. male with PMH significant for morbid obesity, hypertension 8/16, patient presented to the ED with complaint of cough, chest pain.  Chest x-ray showed right lower lobe pneumonia and he was discharged home with a course of doxycycline.  Despite compliance to the antibiotic, patient did not improve and he returned to ED on 8/19.  Also reported vomiting and watery diarrhea.  In the ED, he was afebrile, heart rate and blood pressure elevated, he required 2 L oxygen by nasal cannula WBC count was elevated to 19.9, lactic acid normal COVID-19 PCR negative.  Influenza A/B PCR negative.   Chest x-ray compared to previous x-ray showed bilateral lung opacities, increased on the right and stable on the left  CTPA did not show any pulmonary embolism but showed moderate loculated appearing right pleural effusion and trace left pleural effusion, extensive associated atelectasis or consolidation particularly of the right lung base with more focal opacity in the superior segment right lower lobe consistent with multifocal infection and possible aspiration.  Patient was started on IV fluids, IV antibiotics  Admitted to hospitalist service  See below for details.  Subjective: Patient was seen and examined this morning.  Sitting up in bed.  On 4 L oxygen by nasal cannula.   Tmax 100.5 last night. Waiting for thoracentesis today  Assessment and plan: Sepsis, POA Multifocal pneumonia Large right pleural effusion Presented with pneumonia not responding to doxycycline as an outpatient  CT scan with multifocal pneumonia greatest in right lower lobe with associated loculated pleural effusion.  PCCM and IR were consulted for possible need of chest tube placement.  8/21, ultrasound did not show adequate fluid pocket for pleural  fluid drainage. Initially started on IV Zosyn.  Blood culture negative.  Strep pneumo antigen negative.  Anti-Rx subsequently switched to oral cefdinir and oral Flagyl on 8/23 Because of persistent leukocytosis and low-grade fever, checks x-ray was repeated on 8/23.  It showed large right pleural effusion which is worse than previous x-ray few days ago.  Plan for ultrasound-guided thoracentesis and Pleurx catheter placement by IR today Continue Mucinex, incentive spirometry Recent Labs  Lab 10/16/21 0956 10/17/21 0456 10/18/21 0454 10/19/21 0458 10/20/21 0530 10/21/21 0532  WBC  --  21.4* 26.9* 24.7* 24.3* 21.7*  LATICACIDVEN 1.2  --   --   --   --   --   PROCALCITON  --   --   --  1.13  --  0.71    Acute respiratory failure with hypoxia Remains on 2 to 3 L oxygen by nasal cannula Check ambulatory oxygen requirement 8/23, echocardiogram and ultrasound duplex lower extremities unremarkable  Hemoptysis Related to pneumonia Continue to monitor   Acute metabolic encephalopathy, POA Confusion on admission probably due to pneumonia.   mental status appears now to be back at his typical baseline.   Essential hypertension, poorly controlled Blood pressure notably elevated 191/127 on admission.  Was previously on amlodipine but has been noncompliant. This hospitalization, he was initiated on multiple blood pressure meds.  Gradually improving blood pressure.   Continue Coreg 25 mg twice daily, amlodipine to 10 mg p.o. daily, hydralazine 25 mg PO q8h PRN SBP >170 or DBP >110   Insomnia Melatonin 3 mg p.o. nightly trazodone 100mg  PO qHS PRN   Morbid obesity  -Body  mass index is 40.59 kg/m. Patient has been advised to make an attempt to improve diet and exercise patterns to aid in weight loss.  Goals of care   Code Status: Full Code    Mobility: Encourage ambulation  Skin assessment:     Nutritional status:  Body mass index is 40.59 kg/m.          Diet:  Diet Order              Diet NPO time specified  Diet effective midnight                   DVT prophylaxis: Start Lovenox subcu    Antimicrobials: Cefdinir, Flagyl Fluid: None Consultants: Pulmonology Family Communication: None at bedside  Status is: Inpatient  Continue in-hospital care because: Still remains on supplemental oxygen.  Plan for thoracentesis and Pleurx catheter placement today. Level of care: Telemetry   Dispo: The patient is from: Home              Anticipated d/c is to: Home in 1 to 2 days              Patient currently is not medically stable to d/c.   Difficult to place patient No     Infusions:    Scheduled Meds:  amLODipine  10 mg Oral Daily   carvedilol  25 mg Oral BID WC   cefdinir  300 mg Oral Q12H   enoxaparin (LOVENOX) injection  60 mg Subcutaneous Q24H   guaiFENesin  1,200 mg Oral BID   ipratropium-albuterol  3 mL Nebulization Q6H WA   melatonin  3 mg Oral QHS   metroNIDAZOLE  500 mg Oral BID    PRN meds: acetaminophen, guaiFENesin-dextromethorphan, hydrALAZINE, HYDROcodone bit-homatropine, HYDROmorphone (DILAUDID) injection, ipratropium-albuterol, ondansetron **OR** ondansetron (ZOFRAN) IV, oxyCODONE, senna-docusate, traZODone   Antimicrobials: Anti-infectives (From admission, onward)    Start     Dose/Rate Route Frequency Ordered Stop   10/20/21 1000  cefdinir (OMNICEF) capsule 300 mg        300 mg Oral Every 12 hours 10/20/21 0835     10/20/21 1000  metroNIDAZOLE (FLAGYL) tablet 500 mg        500 mg Oral 2 times daily 10/20/21 0835     10/16/21 2100  piperacillin-tazobactam (ZOSYN) IVPB 3.375 g  Status:  Discontinued        3.375 g 12.5 mL/hr over 240 Minutes Intravenous Every 8 hours 10/16/21 1912 10/20/21 0835   10/16/21 1400  piperacillin-tazobactam (ZOSYN) IVPB 3.375 g        3.375 g 12.5 mL/hr over 240 Minutes Intravenous  Once 10/16/21 1347 10/16/21 1735   10/16/21 1045  cefTRIAXone (ROCEPHIN) 2 g in sodium chloride 0.9 % 100 mL  IVPB  Status:  Discontinued        2 g 200 mL/hr over 30 Minutes Intravenous Every 24 hours 10/16/21 1036 10/16/21 1902   10/16/21 1045  azithromycin (ZITHROMAX) 500 mg in sodium chloride 0.9 % 250 mL IVPB  Status:  Discontinued        500 mg 250 mL/hr over 60 Minutes Intravenous Every 24 hours 10/16/21 1036 10/16/21 1902       Objective: Vitals:   10/21/21 0756 10/21/21 0758  BP:    Pulse:    Resp:    Temp:    SpO2: 94% 94%    Intake/Output Summary (Last 24 hours) at 10/21/2021 1021 Last data filed at 10/20/2021 2122 Gross per 24 hour  Intake 960 ml  Output --  Net 960 ml    Filed Weights   10/18/21 0500 10/19/21 0500 10/20/21 0457  Weight: 132 kg 132.1 kg 132 kg   Weight change:  Body mass index is 40.59 kg/m.   Physical Exam: General exam: Pleasant, young African-American male.  On 2 L oxygen by nasal at rest Skin: No rashes, lesions or ulcers. HEENT: Atraumatic, normocephalic, no obvious bleeding Lungs: No crackles or wheezing, diminished air entry in the right. CVS: Regular rate and rhythm, no murmur GI/Abd soft, nontender, nondistended, bowel sound present CNS: Alert, awake, oriented x3 Psychiatry: Mood appropriate Extremities: Pedal edema trace bilaterally  Data Review: I have personally reviewed the laboratory data and studies available.  F/u labs ordered Unresulted Labs (From admission, onward)     Start     Ordered   10/21/21 0500  Procalcitonin  Daily at 5am,   R     Question:  Specimen collection method  Answer:  Lab=Lab collect   10/20/21 1646   10/16/21 1924  Strep pneumoniae urinary antigen  (COPD / Pneumonia / Cellulitis / Lower Extremity Wound)  Once,   R        10/16/21 1925   10/16/21 1924  Legionella Pneumophila Serogp 1 Ur Ag  (COPD / Pneumonia / Cellulitis / Lower Extremity Wound)  Once,   R        10/16/21 1925            Signed, Lorin Glass, MD [facility] 10/21/2021

## 2021-10-21 NOTE — Progress Notes (Signed)
0200 Pt was asleep. Duoneb not given.

## 2021-10-21 NOTE — Progress Notes (Signed)
   PCCM evening note  PAtient back from Rt chest tube placdment   [place] has 100cc cloudy yellow fluids He is comfortable  Placee 3way stop cock at proximal port near chest wall - drained 60cc of cloudy amber fluids for analysis - contain #1 and used drain to train another 40cc-50cc from distal drain end int container #2  Sending for pleural analysis Get CXR Consider lysis 10/22/21    SIGNATURE    Dr. Kalman Shan, M.D., F.C.C.P,  Pulmonary and Critical Care Medicine Staff Physician, [facility] Director - Interstitial Lung Disease  Program  Medical Director - [facility] Pulmonary Fibrosis [facility] [place], [place], [address]  NPI Number:  NPI #[id number] DEA Number: [id number]  Pager: [phone], If no answer  -> Check AMION or Try [phone] Telephone (clinical office): [phone] Telephone (research): [phone]  6:39 PM 10/21/2021

## 2021-10-21 NOTE — Consult Note (Signed)
Chief Complaint: Patient was seen in consultation today for image guided aspiration/drainage of complex right pleural effusion  Chief Complaint  Patient presents with   Shortness of Breath    Referring Physician(s): Ramaswamy,M  Supervising Physician: Roanna Banning  Patient Status: [facility] - In-pt  History of Present Illness: Todd Pena is a 36 y.o. male ,ex-smoker ,with past medical history significant for obesity, hypertension who was admitted to [facility] on 8/19 with persistent cough, chest pain, imaging findings concerning for right lower lobe pneumonia with complex right pleural effusion.  He has failed to improve despite course of antibiotic therapy.  CT angio chest performed on 8/19 revealed: 1. Examination for pulmonary embolism is somewhat limited by marginal contrast bolus and breath motion artifact, particularly in the lung bases. Within this limitation, no evidence of pulmonary embolism through the proximal segmental pulmonary arterial level. 2. Moderate, loculated appearing right pleural effusion and trace left pleural effusion. Extensive associated atelectasis or consolidation, particularly of the right lung base, with a somewhat more focal opacity of the superior segment right lower lobe. Findings are consistent with multifocal infection and/or aspiration  He was evaluated for possible thoracentesis on 8/21 ,however there was no safe window access to fluid.  Latest CXR  today shows an enlarging right pleural effusion.  Request received from pulmonology for aspiration/drainage of complex right pleural effusion.  He is currently afebrile, slightly tachycardic, BC 21.7, hemoglobin normal, platelets 898k,/INR normal, blood culture negative respiratory panel negative.      Past Medical History:  Diagnosis Date   Hypertension     Past Surgical History:  Procedure Laterality Date   FRACTURE SURGERY      Allergies: Patient has no known  allergies.  Medications: Prior to Admission medications   Medication Sig Start Date End Date Taking? Authorizing Provider  benzonatate (TESSALON) 100 MG capsule Take 1 capsule (100 mg total) by mouth every 8 (eight) hours. 10/13/21  Yes Melene Plan, DO  doxycycline (VIBRAMYCIN) 100 MG capsule Take 1 capsule (100 mg total) by mouth 2 (two) times daily. One po bid x 7 days 10/13/21  Yes Melene Plan, DO  ibuprofen (ADVIL) 200 MG tablet Take 400 mg by mouth every 6 (six) hours as needed for moderate pain.   Yes [provider]  naproxen sodium (ALEVE) 220 MG tablet Take 440 mg by mouth 2 (two) times daily as needed (pain).   Yes [provider]  amLODipine (NORVASC) 5 MG tablet Take 1 tablet (5 mg total) by mouth daily. 12/31/18 01/30/19  Long, Arlyss Repress, MD  amoxicillin (AMOXIL) 500 MG capsule Take 1 capsule (500 mg total) by mouth 3 (three) times daily. Patient not taking: Reported on 10/16/2021 01/17/19   Molpus, John, MD  HYDROcodone-acetaminophen (NORCO) 5-325 MG tablet Take 1 tablet by mouth every 4 (four) hours as needed for severe pain. Patient not taking: Reported on 10/16/2021 01/17/19   Molpus, Jonny Ruiz, MD  ibuprofen (ADVIL) 800 MG tablet Take 1 tablet (800 mg total) by mouth every 8 (eight) hours as needed for moderate pain. Patient not taking: Reported on 10/16/2021 11/11/18   Long, Arlyss Repress, MD     History reviewed. No pertinent family history.  Social History   Socioeconomic History   Marital status: Married    Spouse name: Not on file   Number of children: Not on file   Years of education: Not on file   Highest education level: Not on file  Occupational History   Not on  file  Tobacco Use   Smoking status: Former    Types: Cigarettes   Smokeless tobacco: Never  Vaping Use   Vaping Use: Never used  Substance and Sexual Activity   Alcohol use: No   Drug use: No   Sexual activity: Not on file  Other Topics Concern   Not on file  Social History Narrative   Not on  file   Social Determinants of Health   Financial Resource Strain: Not on file  Food Insecurity: Not on file  Transportation Needs: Not on file  Physical Activity: Not on file  Stress: Not on file  Social Connections: Not on file      Review of Systems currently denies fever, worsening chest pain, nausea, vomiting or bleeding.  Does have mild headache, chest discomfort, cough, and back pain; c/o some "fullness" in abdomen  Vital Signs: BP 124/80 (BP Location: Right Arm)   Pulse (!) 110   Temp 98.8 F (37.1 C)   Resp 18   Ht 5\' 11"  (1.803 m)   Wt 291 lb 0.1 oz (132 kg)   SpO2 91%   BMI 40.59 kg/m     Physical Exam awake, alert.  Chest with diminished breath sounds bases, right greater than left.  Heart with tachycardic but regular rhythm.  Abdomen obese, soft, positive bowel sounds, nontender.  No significant lower extremity edema  Imaging: VAS LOWER EXTREMITY VENOUS (DVT)  Result Date: 10/20/2021  Lower Venous DVT Study Patient Name:  Todd Pena [facility]  Date of Exam:   10/20/2021 Medical Rec #: [id number]/22/2021        Accession #:    [id number] Date of Birth: [birth year]        Patient Gender: M Patient Age:   68 years Exam Location:  [facility] Procedure:      [facility] LOWER EXTREMITY VENOUS (DVT) Referring Phys: [place] --------------------------------------------------------------------------------  Indications: Swelling.  Risk Factors: None identified. Limitations: Poor ultrasound/tissue interface. Comparison Study: No prior studies. Performing Technologist: Lorin Glass RVT  Examination Guidelines: A complete evaluation includes B-mode imaging, spectral Doppler, color Doppler, and power Doppler as needed of all accessible portions of each vessel. Bilateral testing is considered an integral part of a complete examination. Limited examinations for reoccurring indications may be performed as noted. The reflux portion of the exam is performed with the patient in reverse  Trendelenburg.  +---------+---------------+---------+-----------+----------+--------------+ RIGHT    CompressibilityPhasicitySpontaneityPropertiesThrombus Aging +---------+---------------+---------+-----------+----------+--------------+ CFV      Full           Yes      Yes                                 +---------+---------------+---------+-----------+----------+--------------+ SFJ      Full                                                        +---------+---------------+---------+-----------+----------+--------------+ FV Prox  Full                                                        +---------+---------------+---------+-----------+----------+--------------+ FV Mid  Full                                                        +---------+---------------+---------+-----------+----------+--------------+ FV DistalFull                                                        +---------+---------------+---------+-----------+----------+--------------+ PFV      Full                                                        +---------+---------------+---------+-----------+----------+--------------+ Pena      Full           Yes      Yes                                 +---------+---------------+---------+-----------+----------+--------------+ PTV      Full                                                        +---------+---------------+---------+-----------+----------+--------------+ PERO     Full                                                        +---------+---------------+---------+-----------+----------+--------------+   +---------+---------------+---------+-----------+----------+--------------+ LEFT     CompressibilityPhasicitySpontaneityPropertiesThrombus Aging +---------+---------------+---------+-----------+----------+--------------+ CFV      Full           Yes      Yes                                  +---------+---------------+---------+-----------+----------+--------------+ SFJ      Full                                                        +---------+---------------+---------+-----------+----------+--------------+ FV Prox  Full                                                        +---------+---------------+---------+-----------+----------+--------------+ FV Mid   Full                                                        +---------+---------------+---------+-----------+----------+--------------+  FV Distal               Yes      Yes                                 +---------+---------------+---------+-----------+----------+--------------+ PFV      Full                                                        +---------+---------------+---------+-----------+----------+--------------+ Pena      Full           Yes      Yes                                 +---------+---------------+---------+-----------+----------+--------------+ PTV      Full                                                        +---------+---------------+---------+-----------+----------+--------------+ PERO     Full                                                        +---------+---------------+---------+-----------+----------+--------------+     Summary: RIGHT: - There is no evidence of deep vein thrombosis in the lower extremity. However, portions of this examination were limited- see technologist comments above.  - No cystic structure found in the popliteal fossa.  LEFT: - There is no evidence of deep vein thrombosis in the lower extremity. However, portions of this examination were limited- see technologist comments above.  - No cystic structure found in the popliteal fossa.  *See table(s) above for measurements and observations. Electronically signed by Coral Else MD on 10/20/2021 at 8:27:50 PM.    Final    ECHOCARDIOGRAM COMPLETE  Result Date: 10/20/2021    ECHOCARDIOGRAM  REPORT   Patient Name:   Todd Pena [facility] Date of Exam: 10/20/2021 Medical Rec #:  [id number]       Height:       71.0 in Accession #:    [id number]      Weight:       291.0 lb Date of Birth:  12/10/1985       BSA:          2.473 m Patient Age:    35 years        BP:           138/98 mmHg Patient Gender: M               HR:           104 bpm. Exam Location:  Inpatient Procedure: 2D Echo, Cardiac Doppler and Color Doppler Indications:    Abnormal ECG  History:        Patient has no prior history of Echocardiogram examinations.                 Risk  Factors:Hypertension.  Sonographer:    Gaynell FaceEllisa Machuca Referring Phys: [id number] Todd Pena IMPRESSIONS  1. Left ventricular ejection fraction, by estimation, is 60 to 65%. The left ventricle has normal function. The left ventricle has no regional wall motion abnormalities. There is moderate left ventricular hypertrophy. Left ventricular diastolic parameters were normal.  2. Right ventricular systolic function is normal. The right ventricular size is normal.  3. The mitral valve is normal in structure. Trivial mitral valve regurgitation.  4. The aortic valve is tricuspid. Aortic valve regurgitation is not visualized.  5. Aortic dilatation noted. There is moderate dilatation of the aortic root, measuring 44 mm.  6. The inferior vena cava is dilated in size with >50% respiratory variability, suggesting right atrial pressure of 8 mmHg. FINDINGS  Left Ventricle: Left ventricular ejection fraction, by estimation, is 60 to 65%. The left ventricle has normal function. The left ventricle has no regional wall motion abnormalities. The left ventricular internal cavity size was normal in size. There is  moderate left ventricular hypertrophy. Left ventricular diastolic parameters were normal. Right Ventricle: The right ventricular size is normal. Right vetricular wall thickness was not assessed. Right ventricular systolic function is normal. Left Atrium: Left atrial size was normal in  size. Right Atrium: Right atrial size was normal in size. Pericardium: There is no evidence of pericardial effusion. Mitral Valve: The mitral valve is normal in structure. Trivial mitral valve regurgitation. Tricuspid Valve: The tricuspid valve is normal in structure. Tricuspid valve regurgitation is trivial. Aortic Valve: The aortic valve is tricuspid. Aortic valve regurgitation is not visualized. Aortic valve mean gradient measures 2.0 mmHg. Aortic valve peak gradient measures 4.5 mmHg. Aortic valve area, by VTI measures 4.56 cm. Pulmonic Valve: The pulmonic valve was normal in structure. Pulmonic valve regurgitation is not visualized. Aorta: Aortic dilatation noted. There is moderate dilatation of the aortic root, measuring 44 mm. Venous: The inferior vena cava is dilated in size with greater than 50% respiratory variability, suggesting right atrial pressure of 8 mmHg. IAS/Shunts: No atrial level shunt detected by color flow Doppler.  LEFT VENTRICLE PLAX 2D LVIDd:         5.00 cm   Diastology LVIDs:         3.20 cm   LV e' medial:    9.36 cm/s LV PW:         1.30 cm   LV E/e' medial:  7.3 LV IVS:        1.60 cm   LV e' lateral:   13.50 cm/s LVOT diam:     2.60 cm   LV E/e' lateral: 5.1 LV SV:         71 LV SV Index:   29 LVOT Area:     5.31 cm  RIGHT VENTRICLE RV S prime:     14.30 cm/s TAPSE (M-mode): 2.0 cm LEFT ATRIUM             Index        RIGHT ATRIUM           Index LA Vol (A2C):   53.0 ml 21.43 ml/m  RA Area:     14.30 cm LA Vol (A4C):   61.1 ml 24.71 ml/m  RA Volume:   38.40 ml  15.53 ml/m LA Biplane Vol: 57.1 ml 23.09 ml/m  AORTIC VALVE AV Area (Vmax):    4.22 cm AV Area (Vmean):   4.20 cm AV Area (VTI):     4.56 cm AV Vmax:  106.00 cm/s AV Vmean:          71.900 cm/s AV VTI:            0.155 m AV Peak Grad:      4.5 mmHg AV Mean Grad:      2.0 mmHg LVOT Vmax:         84.20 cm/s LVOT Vmean:        56.900 cm/s LVOT VTI:          0.133 m LVOT/AV VTI ratio: 0.86  AORTA Ao Root diam:  4.40 cm Ao Asc diam:  3.40 cm MITRAL VALVE MV Area (PHT): 4.86 cm    SHUNTS MV Decel Time: 156 msec    Systemic VTI:  0.13 m MV E velocity: 68.40 cm/s  Systemic Diam: 2.60 cm MV A velocity: 48.10 cm/s MV E/A ratio:  1.42 Dietrich Pates MD Electronically signed by Dietrich Pates MD Signature Date/Time: 10/20/2021/3:57:18 PM    Final    DG Chest 2 View  Result Date: 10/20/2021 CLINICAL DATA:  Pneumonia EXAM: CHEST - 2 VIEW COMPARISON:  10/16/2021 FINDINGS: Heart size within normal limits. Large right pleural effusion, increased from prior. Progressive bibasilar airspace opacities. No pneumothorax. IMPRESSION: 1. Large right pleural effusion, increased from prior. 2. Progressive bibasilar airspace opacities. Electronically Signed   By: Duanne Guess D.O.   On: 10/20/2021 15:21   [place] CHEST (PLEURAL EFFUSION)  Result Date: 10/18/2021 CLINICAL DATA:  36 year old male with right-sided pleural effusion EXAM: CHEST ULTRASOUND COMPARISON:  None Available. FINDINGS: Highly complicated/loculated but small right-sided pleural effusion. The due to the internal septations, the collapse portion of the right lower lobe is held close to the pleural surface. There is not sufficient space for safe thoracentesis. IMPRESSION: Small a highly complex and loculated right pleural effusion. There is no safe window for thoracentesis. Electronically Signed   By: Malachy Moan M.D.   On: 10/18/2021 13:17   CT Angio Chest PE W and/or Wo Contrast  Result Date: 10/16/2021 CLINICAL DATA:  Pulmonary embolism suspected, recent diagnosis of pneumonia EXAM: CT ANGIOGRAPHY CHEST WITH CONTRAST TECHNIQUE: Multidetector CT imaging of the chest was performed using the standard protocol during bolus administration of intravenous contrast. Multiplanar CT image reconstructions and MIPs were obtained to evaluate the vascular anatomy. RADIATION DOSE REDUCTION: This exam was performed according to the departmental dose-optimization program which includes  automated exposure control, adjustment of the mA and/or kV according to patient size and/or use of iterative reconstruction technique. CONTRAST:  OMNIPAQUE IOHEXOL 350 MG/ML SOLN COMPARISON:  None Available. FINDINGS: Cardiovascular: Examination for pulmonary embolism is somewhat limited by marginal contrast bolus and breath motion artifact, particularly in the lung bases. Within this limitation, no evidence of pulmonary embolism through the proximal segmental pulmonary arterial level. Normal heart size. No pericardial effusion. Mediastinum/Nodes: No enlarged mediastinal, hilar, or axillary lymph nodes. Thyroid gland, trachea, and esophagus demonstrate no significant findings. Lungs/Pleura: Moderate, loculated appearing right pleural effusion and trace left pleural effusion. Extensive associated atelectasis or consolidation, particularly of the right lung base, with a somewhat more focal opacity of the superior segment right lower lobe (series 5, image 42). Upper Abdomen: No acute abnormality. Musculoskeletal: No chest wall abnormality. No acute osseous findings. Review of the MIP images confirms the above findings. IMPRESSION: 1. Examination for pulmonary embolism is somewhat limited by marginal contrast bolus and breath motion artifact, particularly in the lung bases. Within this limitation, no evidence of pulmonary embolism through the proximal segmental pulmonary arterial level. 2.  Moderate, loculated appearing right pleural effusion and trace left pleural effusion. Extensive associated atelectasis or consolidation, particularly of the right lung base, with a somewhat more focal opacity of the superior segment right lower lobe. Findings are consistent with multifocal infection and/or aspiration. Electronically Signed   By: Jearld Lesch M.D.   On: 10/16/2021 13:12   DG Chest 2 View  Result Date: 10/16/2021 CLINICAL DATA:  Progressing pneumonia symptoms. EXAM: CHEST - 2 VIEW COMPARISON:  10/13/2021 and  older studies. FINDINGS: Cardiac silhouette is normal in size. No mediastinal or hilar masses or evidence of adenopathy. Lung volumes are low. There is bilateral lung base opacity mostly obscuring the left and obscuring the right hemidiaphragms, similar on the left but increased on the right compared to the prior exam. Mid to upper lungs remain clear. No convincing pleural effusion.  No pneumothorax. Skeletal structures are intact. IMPRESSION: 1. Bilateral lung base opacities, increased on the right and stable on the left compared to the most recent prior chest radiograph, consistent with pneumonia, atelectasis or a combination. Findings are accentuated by low lung volumes. Electronically Signed   By: Amie Portland M.D.   On: 10/16/2021 09:35   DG Chest 2 View  Result Date: 10/13/2021 CLINICAL DATA:  Cough. EXAM: CHEST - 2 VIEW COMPARISON:  Chest x-ray 03/06/2018 FINDINGS: There is patchy airspace disease in the left lower lobe. There is some minimal atelectasis in the right mid lung. Lung volumes are low. No pleural effusion or pneumothorax. Cardiomediastinal silhouette within normal limits. No acute fractures. IMPRESSION: Left lower lobe airspace disease worrisome for pneumonia. Follow-up chest x-ray recommended in 4-6 weeks to confirm resolution. Low lung volumes. Electronically Signed   By: Darliss Cheney M.D.   On: 10/13/2021 21:07    Labs:  CBC: Recent Labs    10/18/21 0454 10/19/21 0458 10/20/21 0530 10/21/21 0532  WBC 26.9* 24.7* 24.3* 21.7*  HGB 14.3 13.7 12.8* 14.1  HCT 43.6 40.6 38.7* 43.2  PLT 790* 760* 816* 898*    COAGS: Recent Labs    10/16/21 0946  INR 1.2  APTT 40*    BMP: Recent Labs    10/18/21 0454 10/19/21 0458 10/20/21 0530 10/21/21 0532  NA 134* 135 131* 141  K 3.7 3.6 3.3* 3.2*  CL 99 98 95* 99  CO2 24 26 25 30   GLUCOSE 115* 102* 112* 108*  BUN 10 12 13 9   CALCIUM 8.7* 9.1 8.5* 8.9  CREATININE 1.06 1.10 1.08 1.12  GFRNONAA >60 >60 >60 >60    LIVER  FUNCTION TESTS: Recent Labs    10/16/21 0946 10/19/21 0458  BILITOT 1.3* 1.6*  AST 17 16  ALT 23 19  ALKPHOS 57 73  PROT 6.4* 7.3  ALBUMIN 2.8* 3.3*    TUMOR MARKERS: No results for input(s): "AFPTM", "CEA", "CA199", "CHROMGRNA" in the last 8760 hours.  Assessment and Plan: 36 y.o. male ,ex-smoker ,with past medical history significant for obesity, hypertension who was admitted to [facility] on 8/19 with persistent cough, chest pain, imaging findings concerning for right lower lobe pneumonia with complex right pleural effusion.  He has failed to improve despite course of antibiotic therapy.  CT angio chest performed on 8/19 revealed: 1. Examination for pulmonary embolism is somewhat limited by marginal contrast bolus and breath motion artifact, particularly in the lung bases. Within this limitation, no evidence of pulmonary embolism through the proximal segmental pulmonary arterial level. 2. Moderate, loculated appearing right pleural effusion and trace left pleural effusion. Extensive  associated atelectasis or consolidation, particularly of the right lung base, with a somewhat more focal opacity of the superior segment right lower lobe. Findings are consistent with multifocal infection and/or aspiration  He was evaluated for possible thoracentesis on 8/21 ,however there was no safe window access to fluid.  Latest CXR  today shows an enlarging right pleural effusion.  Request received from pulmonology for aspiration/drainage of complex right pleural effusion.  He is currently afebrile, slightly tachycardic, BC 21.7, hemoglobin normal, platelets 898k,/INR normal, blood culture negative respiratory panel negative.  Case has been reviewed by Dr. Milford Cage. Details/risks of procedure, including but not limited to, internal bleeding, infection, injury to adjacent structures/pneumothorax, need for prolonged drainage discussed with patient with his understanding and consent.  Procedure  tentatively scheduled for this afternoon.   Thank you for this interesting consult.  I greatly enjoyed meeting Todd Pena and look forward to participating in their care.  A copy of this report was sent to the requesting provider on this date.  Electronically Signed: D. Jeananne Rama, PA-C 10/21/2021, 2:39 PM   I spent a total of  25 minutes   in face to face in clinical consultation, greater than 50% of which was counseling/coordinating care for image guided aspiration/drainage of complex right pleural effusion

## 2021-10-21 NOTE — Progress Notes (Signed)
Chaplain met Marton briefly and established a relationship of caring. Chaplain will follow up with Imari tomorrow.  [address] [place] Pager, [phone]

## 2021-10-21 NOTE — Progress Notes (Signed)
NAME:  Todd Pena, MRN:  [id number], DOB:  07-12-1985, LOS: 5 ADMISSION DATE:  10/16/2021, CONSULTATION DATE: 10/17/2021 REFERRING MD: Dr. [place], CHIEF COMPLAINT: Loculated effusion, multilobar pneumonia  bRIEF  Patient admitted with worsening shortness of breath Recent diagnosis of pneumonia for which she was treated with a course of doxycycline, symptoms did progress leading to presentation to the emergency department Has had about a week of feeling unwell with exertional dyspnea cough, mild hemoptysis Subjective fevers chills diaphoresis Decreased exercise tolerance  Did not see any significant improvement in his symptoms despite completing course of antibiotics, there was associated diarrhea with this antibiotic use  Denies any chest pains or chest discomfort  Background history of hypertension  Pertinent  Medical History   Past Medical History:  Diagnosis Date   Hypertension     Significant Hospital Events: Including procedures, antibiotic start and stop dates in addition to other pertinent events   CT chest with loculated effusion, multilobar infiltrate- CCM [place] 0-not enough fluid to tap on right side 8/22 - TMax 101 yesterday. AFebrile since then. WBC high. On abx. On 2L [place]. He nis not sure if he is better. IR done [place] -> small loculated effusion with fluid behind scapula. Procdure could not be done due to size and safety 8/22 - fever curve better. WBC better. Feels better. Looks better. Took shower with o2. Culure negative so far 8/23 - 3L [place] . CXR with incrase in effusion. Was changed to oral antibioptics  Interim History / Subjective:    10/21/2021 -now on 4L [place].  Persistent fever. Awaiting thora and pigtail   Objective   Blood pressure (!) 138/94, pulse (!) 109, temperature (!) 100.5 F (38.1 C), temperature source Oral, resp. rate 18, height 5\' 11"  (1.803 m), weight 132 kg, SpO2 94 %.        Intake/Output Summary (Last 24 hours) at 10/21/2021 1137 Last data  filed at 10/20/2021 2122 Gross per 24 hour  Intake 960 ml  Output --  Net 960 ml   Filed Weights   10/18/21 0500 10/19/21 0500 10/20/21 0457  Weight: 132 kg 132.1 kg 132 kg    Examination: General: No distress. Srong overweight Neuro: Alert and Oriented x 3. GCS 15. Speech normal Psych: Pleasant Resp:  Barrel Chest - no.  Wheeze - no, Crackles - no, No overt respiratory distress. RGIHT BASE DIMINISHED AIR ENTRY CVS: Normal heart sounds. Murmurs - no Ext: Stigmata of Connective Tissue Disease - no HEENT: Normal upper airway. PEERL +. No post nasal drip     Resolved Hospital Problem list     Assessment & Plan:  Hypoxemic respiratory failure Respiratory failure secondary to multilobar pneumonia and RLL small loculated effuion Hemoptysis related to pneumonia -Failed outpatient treatment with doxycycline - culture and pcr neg  10/21/2021 -  Effusion worse and concerning empyema   Plan - keep pusle ox  >92% - retest toda on room air with exertional pulse ox (d/w beside nurse to get this done) - get CXR 2 view - aim for dc  home 10/22/21 +/- with o2 -   Cefdinir 300mg  bid and Flagyl 500mg  bid x 2 weeks; both oral - >change to cefepime 10/21/21  Future Appointments  Date Time Provider Department Center  10/28/2021  9:00 AM Parrett, , NP [facility] None       Anti-infectives (From admission, onward)    Start     Dose/Rate Route Frequency Ordered Stop   10/20/21 1000  cefdinir (OMNICEF) capsule  300 mg  Status:  Discontinued        300 mg Oral Every 12 hours 10/20/21 0835 10/21/21 1140   10/20/21 1000  metroNIDAZOLE (FLAGYL) tablet 500 mg  Status:  Discontinued        500 mg Oral 2 times daily 10/20/21 0835 10/21/21 1140   10/16/21 2100  piperacillin-tazobactam (ZOSYN) IVPB 3.375 g  Status:  Discontinued        3.375 g 12.5 mL/hr over 240 Minutes Intravenous Every 8 hours 10/16/21 1912 10/20/21 0835   10/16/21 1400  piperacillin-tazobactam (ZOSYN) IVPB 3.375 g         3.375 g 12.5 mL/hr over 240 Minutes Intravenous  Once 10/16/21 1347 10/16/21 1735   10/16/21 1045  cefTRIAXone (ROCEPHIN) 2 g in sodium chloride 0.9 % 100 mL IVPB  Status:  Discontinued        2 g 200 mL/hr over 30 Minutes Intravenous Every 24 hours 10/16/21 1036 10/16/21 1902   10/16/21 1045  azithromycin (ZITHROMAX) 500 mg in sodium chloride 0.9 % 250 mL IVPB  Status:  Discontinued        500 mg 250 mL/hr over 60 Minutes Intravenous Every 24 hours 10/16/21 1036 10/16/21 1902        Best Practice (right click and "Reselect all SmartList Selections" daily)   Diet/type: Regular consistency (see orders) DVT prophylaxis: prophylactic heparin  GI prophylaxis: N/A Lines: N/A Foley:  N/A Code Status:  full code Last date of multidisciplinary goals of care discussion [Per primary  Followup Future Appointments  Date Time Provider Department Center  10/28/2021  9:00 AM Parrett, Virgel Bouquet, NP [facility] None   l    SIGNATURE    Dr. Kalman Shan, M.D., F.C.C.P,  Pulmonary and Critical Care Medicine Staff Physician, [facility] Center Director - Interstitial Lung Disease  Program  Medical Director - [facility] Pulmonary Fibrosis [facility] [place], [place], [address]  NPI Number:  NPI #[id number] DEA Number: [id number]  Pager: [phone], If no answer  -> Check AMION or Try [phone] Telephone (clinical office): [phone] Telephone (research): [phone]  11:37 AM 10/21/2021   LABS    PULMONARY No results for input(s): "PHART", "PCO2ART", "PO2ART", "HCO3", "TCO2", "O2SAT" in the last 168 hours.  Invalid input(s): "PCO2", "PO2"  CBC Recent Labs  Lab 10/19/21 0458 10/20/21 0530 10/21/21 0532  HGB 13.7 12.8* 14.1  HCT 40.6 38.7* 43.2  WBC 24.7* 24.3* 21.7*  PLT 760* 816* 898*    COAGULATION Recent Labs  Lab 10/16/21 0946  INR 1.2    CARDIAC  No results for input(s): "TROPONINI" in the  last 168 hours. No results for input(s): "PROBNP" in the last 168 hours.   CHEMISTRY Recent Labs  Lab 10/17/21 0456 10/18/21 0454 10/19/21 0458 10/20/21 0530 10/21/21 0532  NA 135 134* 135 131* 141  K 4.1 3.7 3.6 3.3* 3.2*  CL 101 99 98 95* 99  CO2 26 24 26 25 30   GLUCOSE 129* 115* 102* 112* 108*  BUN 13 10 12 13 9   CREATININE 1.17 1.06 1.10 1.08 1.12  CALCIUM 8.9 8.7* 9.1 8.5* 8.9  MG  --   --  2.0 2.1  --   PHOS  --   --  3.2  --   --    Estimated Creatinine Clearance: 127.6 mL/min (by C-G formula based on SCr of 1.12 mg/dL).   LIVER Recent Labs  Lab 10/16/21 [phone]  10/19/21 0458  AST 17 16  ALT 23 19  ALKPHOS 57 73  BILITOT 1.3* 1.6*  PROT 6.4* 7.3  ALBUMIN 2.8* 3.3*  INR 1.2  --      INFECTIOUS Recent Labs  Lab 10/16/21 0956 10/19/21 0458 10/21/21 0532  LATICACIDVEN 1.2  --   --   PROCALCITON  --  1.13 0.71     ENDOCRINE CBG (last 3)  No results for input(s): "GLUCAP" in the last 72 hours.       IMAGING x48h  - image(s) personally visualized  -   highlighted in bold VAS US LOWER EXTREMITY VENOUS (DVT)  Result Date: 10/20/2021  Lower Venous DVT Study Patient Name:  Todd Pena  Date of Exam:   10/20/2021 Medical Rec #: [id number]        Accession #:    [id number] Date of Birth: 10/18/1985        Patient Gender: M Patient Age:   [age] years [facility] Procedure:      VAS US LOWER EXTREMITY VENOUS (DVT) Referring Phys: Lorin GlassBINAYA DAHAL --------------------------------------------------------------------------------  Indications: Swelling.  Risk Factors: None identified. Limitations: Poor ultrasound/tissue interface. Comparison Study: No prior studies. Performing Technologist: Chanda BusingGregory Collins RVT  Examination Guidelines: A complete evaluation includes B-mode imaging, spectral Doppler, color Doppler, and power Doppler as needed of all accessible portions of each vessel. Bilateral testing is considered an integral part of a complete  examination. Limited examinations for reoccurring indications may be performed as noted. The reflux portion of the exam is performed with the patient in reverse Trendelenburg.  +---------+---------------+---------+-----------+----------+--------------+ RIGHT    CompressibilityPhasicitySpontaneityPropertiesThrombus Aging +---------+---------------+---------+-----------+----------+--------------+ CFV      Full           Yes      Yes                                 +---------+---------------+---------+-----------+----------+--------------+ SFJ      Full                                                        +---------+---------------+---------+-----------+----------+--------------+ FV Prox  Full                                                        +---------+---------------+---------+-----------+----------+--------------+ FV Mid   Full                                                        +---------+---------------+---------+-----------+----------+--------------+ FV DistalFull                                                        +---------+---------------+---------+-----------+----------+--------------+ PFV      Full                                                        +---------+---------------+---------+-----------+----------+--------------+  POP      Full           Yes      Yes                                 +---------+---------------+---------+-----------+----------+--------------+ PTV      Full                                                        +---------+---------------+---------+-----------+----------+--------------+ PERO     Full                                                        +---------+---------------+---------+-----------+----------+--------------+   +---------+---------------+---------+-----------+----------+--------------+ LEFT     CompressibilityPhasicitySpontaneityPropertiesThrombus Aging  +---------+---------------+---------+-----------+----------+--------------+ CFV      Full           Yes      Yes                                 +---------+---------------+---------+-----------+----------+--------------+ SFJ      Full                                                        +---------+---------------+---------+-----------+----------+--------------+ FV Prox  Full                                                        +---------+---------------+---------+-----------+----------+--------------+ FV Mid   Full                                                        +---------+---------------+---------+-----------+----------+--------------+ FV Distal               Yes      Yes                                 +---------+---------------+---------+-----------+----------+--------------+ PFV      Full                                                        +---------+---------------+---------+-----------+----------+--------------+ POP      Full           Yes      Yes                                 +---------+---------------+---------+-----------+----------+--------------+  PTV      Full                                                        +---------+---------------+---------+-----------+----------+--------------+ PERO     Full                                                        +---------+---------------+---------+-----------+----------+--------------+     Summary: RIGHT: - There is no evidence of deep vein thrombosis in the lower extremity. However, portions of this examination were limited- see technologist comments above.  - No cystic structure found in the popliteal fossa.  LEFT: - There is no evidence of deep vein thrombosis in the lower extremity. However, portions of this examination were limited- see technologist comments above.  - No cystic structure found in the popliteal fossa.  *See table(s) above for measurements and observations.  Electronically signed by Coral Else MD on 10/20/2021 at 8:27:50 PM.    Final    ECHOCARDIOGRAM COMPLETE  Result Date: 10/20/2021    ECHOCARDIOGRAM REPORT   Patient Name:   Todd Pena [facility] Date of Exam: 10/20/2021 Medical Rec #:  [id number]       Height:       71.0 in Accession #:    [id number]      Weight:       291.0 lb Date of Birth:  Feb 19, 1986       BSA:          2.473 m Patient Age:    35 years        BP:           138/98 mmHg Patient Gender: M               HR:           104 bpm. Exam Location:  Inpatient Procedure: 2D Echo, Cardiac Doppler and Color Doppler Indications:    Abnormal ECG  History:        Patient has no prior history of Echocardiogram examinations.                 Risk Factors:Hypertension.  Sonographer:    Gaynell Face Referring Phys: [id number] BINAYA DAHAL IMPRESSIONS  1. Left ventricular ejection fraction, by estimation, is 60 to 65%. The left ventricle has normal function. The left ventricle has no regional wall motion abnormalities. There is moderate left ventricular hypertrophy. Left ventricular diastolic parameters were normal.  2. Right ventricular systolic function is normal. The right ventricular size is normal.  3. The mitral valve is normal in structure. Trivial mitral valve regurgitation.  4. The aortic valve is tricuspid. Aortic valve regurgitation is not visualized.  5. Aortic dilatation noted. There is moderate dilatation of the aortic root, measuring 44 mm.  6. The inferior vena cava is dilated in size with >50% respiratory variability, suggesting right atrial pressure of 8 mmHg. FINDINGS  Left Ventricle: Left ventricular ejection fraction, by estimation, is 60 to 65%. The left ventricle has normal function. The left ventricle has no regional wall motion abnormalities. The left ventricular internal cavity size was normal in size. There is  moderate left ventricular hypertrophy.  Left ventricular diastolic parameters were normal. Right Ventricle: The right ventricular size is  normal. Right vetricular wall thickness was not assessed. Right ventricular systolic function is normal. Left Atrium: Left atrial size was normal in size. Right Atrium: Right atrial size was normal in size. Pericardium: There is no evidence of pericardial effusion. Mitral Valve: The mitral valve is normal in structure. Trivial mitral valve regurgitation. Tricuspid Valve: The tricuspid valve is normal in structure. Tricuspid valve regurgitation is trivial. Aortic Valve: The aortic valve is tricuspid. Aortic valve regurgitation is not visualized. Aortic valve mean gradient measures 2.0 mmHg. Aortic valve peak gradient measures 4.5 mmHg. Aortic valve area, by VTI measures 4.56 cm. Pulmonic Valve: The pulmonic valve was normal in structure. Pulmonic valve regurgitation is not visualized. Aorta: Aortic dilatation noted. There is moderate dilatation of the aortic root, measuring 44 mm. Venous: The inferior vena cava is dilated in size with greater than 50% respiratory variability, suggesting right atrial pressure of 8 mmHg. IAS/Shunts: No atrial level shunt detected by color flow Doppler.  LEFT VENTRICLE PLAX 2D LVIDd:         5.00 cm   Diastology LVIDs:         3.20 cm   LV e' medial:    9.36 cm/s LV PW:         1.30 cm   LV E/e' medial:  7.3 LV IVS:        1.60 cm   LV e' lateral:   13.50 cm/s LVOT diam:     2.60 cm   LV E/e' lateral: 5.1 LV SV:         71 LV SV Index:   29 LVOT Area:     5.31 cm  RIGHT VENTRICLE RV S prime:     14.30 cm/s TAPSE (M-mode): 2.0 cm LEFT ATRIUM             Index        RIGHT ATRIUM           Index LA Vol (A2C):   53.0 ml 21.43 ml/m  RA Area:     14.30 cm LA Vol (A4C):   61.1 ml 24.71 ml/m  RA Volume:   38.40 ml  15.53 ml/m LA Biplane Vol: 57.1 ml 23.09 ml/m  AORTIC VALVE AV Area (Vmax):    4.22 cm AV Area (Vmean):   4.20 cm AV Area (VTI):     4.56 cm AV Vmax:           106.00 cm/s AV Vmean:          71.900 cm/s AV VTI:            0.155 m AV Peak Grad:      4.5 mmHg AV Mean Grad:       2.0 mmHg LVOT Vmax:         84.20 cm/s LVOT Vmean:        56.900 cm/s LVOT VTI:          0.133 m LVOT/AV VTI ratio: 0.86  AORTA Ao Root diam: 4.40 cm Ao Asc diam:  3.40 cm MITRAL VALVE MV Area (PHT): 4.86 cm    SHUNTS MV Decel Time: 156 msec    Systemic VTI:  0.13 m MV E velocity: 68.40 cm/s  Systemic Diam: 2.60 cm MV A velocity: 48.10 cm/s MV E/A ratio:  1.42 Dietrich Pates MD Electronically signed by Dietrich Pates MD Signature Date/Time: 10/20/2021/3:57:18 PM    Final    DG Chest  2 View  Result Date: 10/20/2021 CLINICAL DATA:  Pneumonia EXAM: CHEST - 2 VIEW COMPARISON:  10/16/2021 FINDINGS: Heart size within normal limits. Large right pleural effusion, increased from prior. Progressive bibasilar airspace opacities. No pneumothorax. IMPRESSION: 1. Large right pleural effusion, increased from prior. 2. Progressive bibasilar airspace opacities. Electronically Signed   By: Duanne Guess D.O.   On: 10/20/2021 15:21

## 2021-10-21 NOTE — Progress Notes (Signed)
Pharmacy Antibiotic Note  Todd Pena is a 36 y.o. male admitted on 10/16/2021 with pneumonia.  Pt was transitioned to PO antibiotics yesterday - Tmax 100.5. Pharmacy has been consulted for cefepime dosing for empyema by CCM.  Today, 10/21/21 -WBC remains elevated -Febrile -PCT trending down -Renal function stable  Plan: Cefepime 2 g IV q8h Monitor renal function  Height: 5\' 11"  (180.3 cm) Weight: 132 kg (291 lb 0.1 oz) IBW/kg (Calculated) : 75.3  Temp (24hrs), Avg:100.1 F (37.8 C), Min:99.8 F (37.7 C), Max:100.5 F (38.1 C)  Recent Labs  Lab 10/16/21 0956 10/17/21 0456 10/18/21 0454 10/19/21 0458 10/20/21 0530 10/21/21 0532  WBC  --  21.4* 26.9* 24.7* 24.3* 21.7*  CREATININE  --  1.17 1.06 1.10 1.08 1.12  LATICACIDVEN 1.2  --   --   --   --   --     Estimated Creatinine Clearance: 127.6 mL/min (by C-G formula based on SCr of 1.12 mg/dL).    No Known Allergies  Antimicrobials this admission: Piperacillin/tazobactam 8/19 >> 8/23 Cefdinir + metronidazole 8/23 >> 8/24 Cefepime 8/24 >>  Dose adjustments this admission:  Microbiology results: 8/19 BCx: ngF 8/19 Sputum: Sample not acceptable for testing  8/21 resp panel: negative  9/21, PharmD 10/21/2021 12:11 PM

## 2021-10-22 LAB — COMPREHENSIVE METABOLIC PANEL
ALT: 24 U/L (ref 0–44)
AST: 27 U/L (ref 15–41)
Albumin: 2.8 g/dL — ABNORMAL LOW (ref 3.5–5.0)
Alkaline Phosphatase: 76 U/L (ref 38–126)
Anion gap: 6 (ref 5–15)
BUN: 9 mg/dL (ref 6–20)
CO2: 32 mmol/L (ref 22–32)
Calcium: 8.4 mg/dL — ABNORMAL LOW (ref 8.9–10.3)
Chloride: 98 mmol/L (ref 98–111)
Creatinine, Ser: 1.15 mg/dL (ref 0.61–1.24)
GFR, Estimated: >60 mL/min (ref >60)
Glucose, Bld: 114 mg/dL — ABNORMAL HIGH (ref 70–99)
Potassium: 3.6 mmol/L (ref 3.5–5.1)
Sodium: 136 mmol/L (ref 135–145)
Total Bilirubin: 1 mg/dL (ref 0.3–1.2)
Total Protein: 6.9 g/dL (ref 6.5–8.1)

## 2021-10-22 LAB — GLUCOSE, PLEURAL OR PERITONEAL FLUID: Glucose, Fluid: 48 mg/dL

## 2021-10-22 LAB — LACTATE DEHYDROGENASE, PLEURAL OR PERITONEAL FLUID: LD, Fluid: 1328 U/L — ABNORMAL HIGH (ref 3–23)

## 2021-10-22 LAB — ALBUMIN, PLEURAL OR PERITONEAL FLUID: Albumin, Fluid: 2.5 g/dL

## 2021-10-22 LAB — PROTEIN, PLEURAL OR PERITONEAL FLUID: Total protein, fluid: 5.3 g/dL

## 2021-10-22 LAB — LACTATE DEHYDROGENASE: LDH: 224 U/L — ABNORMAL HIGH (ref 98–192)

## 2021-10-22 LAB — PROCALCITONIN: Procalcitonin: 0.36 ng/mL

## 2021-10-22 MED ORDER — SODIUM CHLORIDE (PF) 0.9 % IJ SOLN
10.0000 mg | Freq: Once | INTRAMUSCULAR | Status: AC
  Administered 2021-10-22: 10 mg via INTRAPLEURAL
  Filled 2021-10-22: qty 10

## 2021-10-22 MED ORDER — STERILE WATER FOR INJECTION IJ SOLN
5.0000 mg | Freq: Once | INTRAMUSCULAR | Status: AC
  Administered 2021-10-22: 5 mg via INTRAPLEURAL
  Filled 2021-10-22: qty 5

## 2021-10-22 NOTE — Progress Notes (Signed)
Mobility Specialist Cancellation/Refusal Note:    10/22/21 1137  Mobility  Activity Refused mobility     Reason for Cancellation/Refusal: Pt declined mobility at this time. Pt waiting for pain meds. Will check back as schedule permits.  ]    Schuyler Amor Mobility Specialist

## 2021-10-22 NOTE — Progress Notes (Signed)
NAME:  Todd Pena, MRN:  [id number], DOB:  [birth year], LOS: 6 ADMISSION DATE:  10/16/2021, CONSULTATION DATE:  10/17/2021 REFERRING MD:  Dr. [place], CHIEF COMPLAINT:  Short of breath  History of Present Illness:  36 yo male presented to ER on 8/16 with Rt sided back pain, fatigue, productive cough and dyspnea.  He was found to have Rt lower lobe infiltrate consistent with community acquired pneumonia, and discharged home with oral antibiotic and plan to follow up with his PCP.  He returned to the ER on 8/19 with progressive dyspnea, hemoptysis, and pleuritic chest pain.  He was admitted to the hospital and started on broad spectrum IV antibiotics.  CT chest showed loculated effusion and PCCM asked to assist with management of this.  Pertinent  Medical History  HTN  Significant Hospital Events: Including procedures, antibiotic start and stop dates in addition to other pertinent events   8/16 ER assessment >> dx with CAP and d/c home with doxycycline 8/19 Admit, start IV ABx 8/20 bedside u/s >> only small fluid pocket 8/21 IR assessment >> only small fluid pocket 8/23 progression of Rt pleural effusion  8/24 IR reconsulted >> 12 F Rt pigtail chest tube placed  8/25 start pleurolytic therapy  Studies:  CT angio chest 8/19 >> moderate loculated Rt pleural effusion, trace Lt pleural effusion, extensive consolidation and ATX especially at Rt lung base Echo 8/23 >> EF 60 to 65%, mod LVH, aortic root 44 mm Rt pleural fluid 8/24 >> glucose 48, protein 5.3, LDH 1328, WBC 637 (93% neutrophils)  Interim History / Subjective:  Still has cough but decreased sputum.  Has pain in Rt chest at chest tube site.  Objective   Blood pressure (!) 140/92, pulse 95, temperature 99.6 F (37.6 C), temperature source Oral, resp. rate 18, height 5\' 11"  (1.803 m), weight 132.1 kg, SpO2 95 %.    FiO2 (%):  [36 %] 36 %   Intake/Output Summary (Last 24 hours) at 10/22/2021 0749 Last data filed at 10/22/2021  0426 Gross per 24 hour  Intake 400 ml  Output 748 ml  Net -348 ml   Filed Weights   10/19/21 0500 10/20/21 0457 10/22/21 0500  Weight: 132.1 kg 132 kg 132.1 kg    Examination:  General - alert Eyes - pupils reactive ENT - no sinus tenderness, no stridor Cardiac - regular rate/rhythm, no murmur Chest - decreased BS on Rt Abdomen - soft, non tender, + bowel sounds Extremities - no cyanosis, clubbing, or edema Skin - no rashes Neuro - normal strength, moves extremities, follows commands Psych - normal mood and behavior  Assessment & Plan:   Rt complex parapneumonic effusion in setting of Rt lower lobar bacterial community acquired pneumonia. - Abx day 7, currently on cefepime and flagyl - Rt pigtail catheter placed by IR on 8/24 - start pleurolytic therapy on 8/25 - f/u chest xray  Aortic root dilation. Hx of HTN. - will need outpt assessment by cardiology   Thrombocytosis. - likely reactive in setting of infection - f/u CBC  Labs       Latest Ref Rng & Units 10/22/2021    5:09 AM 10/21/2021    5:32 AM 10/20/2021    5:30 AM  CMP  Glucose 70 - 99 mg/dL 10/22/2021  683  419   BUN 6 - 20 mg/dL 9  9  13    Creatinine 0.61 - 1.24 mg/dL 622   2.97   Sodium 135 - 145 mmol/L 136  141  131   Potassium 3.5 - 5.1 mmol/L 3.6  3.2  3.3   Chloride 98 - 111 mmol/L 98  99  95   CO2 22 - 32 mmol/L 32  30  25   Calcium 8.9 - 10.3 mg/dL 8.4  8.9  8.5   Total Protein 6.5 - 8.1 g/dL 6.9     Total Bilirubin 0.3 - 1.2 mg/dL 1.0     Alkaline Phos 38 - 126 U/L 76     AST 15 - 41 U/L 27     ALT 0 - 44 U/L 24          Latest Ref Rng & Units 10/21/2021    5:32 AM 10/20/2021    5:30 AM 10/19/2021    4:58 AM  CBC  WBC 4.0 - 10.5 K/uL 21.7  24.3  24.7   Hemoglobin 13.0 - 17.0 g/dL 14.9  70.2  63.7   Hematocrit 39.0 - 52.0 % 43.2  38.7  40.6   Platelets 150 - 400 K/uL 898  816  760     Signature:  Coralyn Helling, MD [facility] Pager - [phone] 10/22/2021,  9:51 AM

## 2021-10-22 NOTE — Progress Notes (Signed)
Mobility Specialist - Progress Note    10/22/21 1439  Mobility  HOB Elevated/Bed Position Self regulated  Activity Ambulated independently in hallway  Range of Motion/Exercises Active  Level of Assistance Independent after set-up  Assistive Device None  Distance Ambulated (ft) 125 ft  Activity Response Tolerated well  Transport method Ambulatory  $Mobility charge 1 Mobility   Pt received in bed and agreeable to mobility. C/o lightheadedness during ambulation. Pt to bed after session with all needs met.       [facility]

## 2021-10-22 NOTE — Progress Notes (Signed)
PROGRESS NOTE  Todd Pena  DOB: [birth year]  PCP: Patient, No Pcp Per ION:[id number]  DOA: 10/16/2021  LOS: 6 days  Hospital Day: 7  Brief narrative: Todd Pena is a 36 y.o. male with PMH significant for morbid obesity, hypertension 8/16, patient presented to the ED with complaint of cough, chest pain.  Chest x-ray showed right lower lobe pneumonia and he was discharged home with a course of doxycycline.  Despite compliance to the antibiotic, patient did not improve and he returned to ED on 8/19.  Also reported vomiting and watery diarrhea.  In the ED, he was afebrile, heart rate and blood pressure elevated, he required 2 L oxygen by nasal cannula WBC count was elevated to 19.9, lactic acid normal COVID-19 PCR negative.  Influenza A/B PCR negative.   Chest x-ray compared to previous x-ray showed bilateral lung opacities, increased on the right and stable on the left  CTPA did not show any pulmonary embolism but showed moderate loculated appearing right pleural effusion and trace left pleural effusion, extensive associated atelectasis or consolidation particularly of the right lung base with more focal opacity in the superior segment right lower lobe consistent with multifocal infection and possible aspiration.  Patient was started on IV fluids, IV antibiotics  Admitted to hospitalist service  See below for details.  Subjective: Patient was seen and examined this morning.  Sitting up in bed.  On 4 L oxygen by nasal cannula.   Underwent right pigtail chest tube placed yesterday.  Pigtail tube in suction  Assessment and plan: Sepsis, POA Multifocal pneumonia Large right parapneumonic effusion Presented with pneumonia not responding to doxycycline as an outpatient  CT scan with multifocal pneumonia greatest in right lower lobe with associated loculated pleural effusion.  Patient was started on IV Zosyn.  Blood culture negative.  Strep pneumo antigen negative.  Anti-Rx  subsequently switched to oral cefdinir and oral Flagyl on 8/23 8/23, because of persistent leukocytosis and low-grade fever, checks x-ray was repeated on 8/23.  It showed large right pleural effusion which is worse than previous x-ray few days ago.  Antibiotics upgraded to IV cefepime 8/25, ultrasound-guided thoracentesis and pigtail chest tube was placed by IR by IR today. Chest tube in suction.  Patient remains on supplemental oxygen. Continue Mucinex, incentive spirometry Recent Labs  Lab 10/16/21 0956 10/17/21 0456 10/18/21 0454 10/19/21 0458 10/20/21 0530 10/21/21 0532 10/22/21 0509  WBC  --  21.4* 26.9* 24.7* 24.3* 21.7*  --   LATICACIDVEN 1.2  --   --   --   --   --   --   PROCALCITON  --   --   --  1.13  --  0.71 0.36    Acute respiratory failure with hypoxia Remains on 2 to 3 L oxygen by nasal cannula Check ambulatory oxygen requirement 8/23, echocardiogram and ultrasound duplex lower extremities unremarkable  Hemoptysis Related to pneumonia Continue to monitor   Acute metabolic encephalopathy, POA Confusion on admission probably due to pneumonia.   mental status appears now to be back at his typical baseline.   Essential hypertension, poorly controlled Blood pressure notably elevated 191/127 on admission.  Was previously on amlodipine but has been noncompliant. This hospitalization, he was initiated on multiple blood pressure meds.  Gradually improving blood pressure.   Continue Coreg 25 mg twice daily, amlodipine to 10 mg p.o. daily, hydralazine 25 mg PO q8h PRN SBP >170 or DBP >110   Insomnia Melatonin 3 mg p.o. nightly trazodone 100mg  PO  qHS PRN   Morbid obesity  -Body mass index is 40.62 kg/m. Patient has been advised to make an attempt to improve diet and exercise patterns to aid in weight loss.  Goals of care   Code Status: Full Code    Mobility: Encourage ambulation  Skin assessment:     Nutritional status:  Body mass index is 40.62 kg/m.           Diet:  Diet Order             Diet regular Room service appropriate? Yes; Fluid consistency: Thin  Diet effective now                   DVT prophylaxis: Start Lovenox subcu    Antimicrobials: IV cefepime. Fluid: None Consultants: Pulmonology Family Communication: None at bedside  Status is: Inpatient  Continue in-hospital care because: Still remains on supplemental oxygen.  Pigtail chest tube on the right side. Level of care: Telemetry   Dispo: The patient is from: Home              Anticipated d/c is to: Home in 1 to 2 days              Patient currently is not medically stable to d/c.   Difficult to place patient No     Infusions:   ceFEPime (MAXIPIME) IV 2 g (10/22/21 1324)    Scheduled Meds:  amLODipine  10 mg Oral Daily   carvedilol  25 mg Oral BID WC   enoxaparin (LOVENOX) injection  60 mg Subcutaneous Q24H   guaiFENesin  1,200 mg Oral BID   melatonin  3 mg Oral QHS    PRN meds: acetaminophen, guaiFENesin-dextromethorphan, hydrALAZINE, HYDROcodone bit-homatropine, HYDROmorphone (DILAUDID) injection, ipratropium-albuterol, ondansetron **OR** ondansetron (ZOFRAN) IV, oxyCODONE, senna-docusate, traZODone   Antimicrobials: Anti-infectives (From admission, onward)    Start     Dose/Rate Route Frequency Ordered Stop   10/21/21 1300  ceFEPIme (MAXIPIME) 2 g in sodium chloride 0.9 % 100 mL IVPB        2 g 200 mL/hr over 30 Minutes Intravenous Every 8 hours 10/21/21 1210     10/20/21 1000  cefdinir (OMNICEF) capsule 300 mg  Status:  Discontinued        300 mg Oral Every 12 hours 10/20/21 0835 10/21/21 1140   10/20/21 1000  metroNIDAZOLE (FLAGYL) tablet 500 mg  Status:  Discontinued        500 mg Oral 2 times daily 10/20/21 0835 10/21/21 1140   10/16/21 2100  piperacillin-tazobactam (ZOSYN) IVPB 3.375 g  Status:  Discontinued        3.375 g 12.5 mL/hr over 240 Minutes Intravenous Every 8 hours 10/16/21 1912 10/20/21 0835   10/16/21 1400   piperacillin-tazobactam (ZOSYN) IVPB 3.375 g        3.375 g 12.5 mL/hr over 240 Minutes Intravenous  Once 10/16/21 1347 10/16/21 1735   10/16/21 1045  cefTRIAXone (ROCEPHIN) 2 g in sodium chloride 0.9 % 100 mL IVPB  Status:  Discontinued        2 g 200 mL/hr over 30 Minutes Intravenous Every 24 hours 10/16/21 1036 10/16/21 1902   10/16/21 1045  azithromycin (ZITHROMAX) 500 mg in sodium chloride 0.9 % 250 mL IVPB  Status:  Discontinued        500 mg 250 mL/hr over 60 Minutes Intravenous Every 24 hours 10/16/21 1036 10/16/21 1902       Objective: Vitals:   10/22/21 0953 10/22/21 1343  BP:  134/77  Pulse:  (!) 104  Resp:  19  Temp:  99.5 F (37.5 C)  SpO2: 91% 92%    Intake/Output Summary (Last 24 hours) at 10/22/2021 1440 Last data filed at 10/22/2021 1000 Gross per 24 hour  Intake 640 ml  Output 1348 ml  Net -708 ml    Filed Weights   10/19/21 0500 10/20/21 0457 10/22/21 0500  Weight: 132.1 kg 132 kg 132.1 kg   Weight change:  Body mass index is 40.62 kg/m.   Physical Exam: General exam: Pleasant, young African-American male.  On 2 L oxygen by nasal cannula at rest Skin: No rashes, lesions or ulcers. HEENT: Atraumatic, normocephalic, no obvious bleeding Lungs: No crackles or wheezing, diminished air entry in the right.  Pigtail chest tube in place CVS: Regular rate and rhythm, no murmur GI/Abd soft, nontender, nondistended, bowel sound present CNS: Alert, awake, oriented x3 Psychiatry: Mood appropriate Extremities: Pedal edema trace bilaterally  Data Review: I have personally reviewed the laboratory data and studies available.  F/u labs ordered Unresulted Labs (From admission, onward)     Start     Ordered   10/21/21 1951  CD19 and CD20, Flow Cytometry  Once,   R        10/21/21 1951   10/21/21 1621  pH, Pleural Fluid (as Misc Test)  Once,   R       Comments: PLEURAL FLUID IS NOT AN ACCEPTED SPECIMEN SOURCE FOR ORDER "PH, BODY FLUID - [id number]"   THIS MISC. LAB  ORDER SHOULD BE USED INSTEAD.  TESTING WILL BE PERFORMED AT [facility].  RESULT WILL DISPLAY IN RESULTS REVIEW "MISCELLANEOUS TEST" WITH A SCANNED REPORT LINK   Question:  Test name / description:  Answer:  PH, BODY FLUID / [id number] THROUGH [facility] TO [facility] - ORDER #[id number]   10/21/21 1622   10/21/21 1619  Amylase, Body Fluid (other)  Once,   R        10/21/21 1622   10/21/21 1616  Acid Fast Smear (AFB)  (AFB smear + Culture w reflexed sensitivities panel)  Once,   R       See Hyperspace for full Linked Orders Report.   10/21/21 1622   10/21/21 1616  Acid Fast Culture with reflexed sensitivities  (AFB smear + Culture w reflexed sensitivities panel)  Once,   R       See Hyperspace for full Linked Orders Report.   10/21/21 1622   10/16/21 1924  Strep pneumoniae urinary antigen  (COPD / Pneumonia / Cellulitis / Lower Extremity Wound)  Once,   R        10/16/21 1925   10/16/21 1924  Legionella Pneumophila Serogp 1 Ur Ag  (COPD / Pneumonia / Cellulitis / Lower Extremity Wound)  Once,   R        10/16/21 1925            Signed, Lorin Glass, MD [facility] 10/22/2021

## 2021-10-22 NOTE — Progress Notes (Signed)
Patient ID: Todd Pena, male   DOB: 19-Aug-1985, 36 y.o.   MRN: [id number] Pt s/p drainage of complex rt pleural effusion yesterday; sitting up in bed; has some soreness at rt chest drain site; occ cough, temp 99.5, chest drain OP 150 cc amber fluid, no air leak, cx pend, cyt pend, CCM instilling lytics via drain; cont current tx as per CCM; consider f/u CT chest once output minimal

## 2021-10-22 NOTE — Progress Notes (Signed)
Chaplain engaged Noe in a follow up conversation after meeting him yesterday.  Chaplain provided support as he shared about his time in the hospital and his determination to do his part in his healing. Chaplain also provided spiritual support as he shared about the meaning of prayer to him.  [address], Bcc Pager, [phone]

## 2021-10-22 NOTE — Procedures (Signed)
Pleural Fibrinolytic Administration Procedure Note  Todd Pena  [id number]  11-01-1985  Date:10/22/21  Time:11:28 AM   Provider Performing:Brooke Lodema Hong   Procedure: Pleural Fibrinolysis Initial day [phone]  Indication(s) Fibrinolysis of complicated pleural effusion  Consent Risks of the procedure as well as the alternatives and risks of each were explained to the patient and/or caregiver.  Consent for the procedure was obtained.   Anesthesia None   Time Out Verified patient identification, verified procedure, site/side was marked, verified correct patient position, special equipment/implants available, medications/allergies/relevant history reviewed, required imaging and test results available.   Sterile Technique Hand hygiene, gloves   Procedure Description Existing pleural catheter was cleaned and accessed in sterile manner.  10mg  of tPA in 30cc of saline and 5mg  of dornase in 30cc of sterile water were injected into pleural space using existing pleural catheter.  Catheter will be clamped for 1 hour and then placed back to suction.  Patient instructed and verbalizes to change positions frequently.    Complications/Tolerance None; patient tolerated the procedure well.   EBL None   Specimen(s) None      , MSN, AG-ACNP-BC [facility] 10/22/2021, 11:29 AM  See Amion for pager If no response to pager, please call PCCM consult pager After 7:00 pm call Elink

## 2021-10-23 ENCOUNTER — Inpatient Hospital Stay (HOSPITAL_COMMUNITY): Payer: Self-pay

## 2021-10-23 MED ORDER — STERILE WATER FOR INJECTION IJ SOLN
5.0000 mg | Freq: Once | RESPIRATORY_TRACT | Status: AC
  Administered 2021-10-23: 5 mg via INTRAPLEURAL
  Filled 2021-10-23: qty 5

## 2021-10-23 MED ORDER — SODIUM CHLORIDE (PF) 0.9 % IJ SOLN
10.0000 mg | Freq: Once | INTRAMUSCULAR | Status: AC
  Administered 2021-10-23: 10 mg via INTRAPLEURAL
  Filled 2021-10-23: qty 10

## 2021-10-23 NOTE — Procedures (Signed)
Pleural Fibrinolytic Administration Procedure Note  Todd Pena  [id number]  May 25, 1985  Date:10/23/21  Time:1:55 PM   Provider Performing:Jazmarie Biever   Procedure: Pleural Fibrinolysis Subsequent day (49449)  Indication(s) Fibrinolysis of complicated pleural effusion  Consent Risks of the procedure as well as the alternatives and risks of each were explained to the patient and/or caregiver.  Consent for the procedure was obtained.   Anesthesia None   Time Out Verified patient identification, verified procedure, site/side was marked, verified correct patient position, special equipment/implants available, medications/allergies/relevant history reviewed, required imaging and test results available.   Sterile Technique Hand hygiene, gloves   Procedure Description Existing pleural catheter was cleaned and accessed in sterile manner.  10mg  of tPA in 30cc of saline and 5mg  of dornase in 30cc of sterile water were injected into pleural space using existing pleural catheter.  Catheter will be clamped for 1 hour and then placed back to suction.   Complications/Tolerance None; patient tolerated the procedure well.   EBL None   Specimen(s) None  , MD [facility] Pager - [phone] 10/23/2021, 1:55 PM

## 2021-10-23 NOTE — Progress Notes (Signed)
PROGRESS NOTE  Todd Pena  DOB: September 17, 1985  PCP: Patient, No Pcp Per [id number]  DOA: 10/16/2021  LOS: 7 days  Hospital Day: 8  Brief narrative: Todd Pena is a 36 y.o. male with PMH significant for morbid obesity, hypertension 8/16, patient presented to the ED with complaint of cough, chest pain.  Chest x-ray showed right lower lobe pneumonia and he was discharged home with a course of doxycycline.  Despite compliance to the antibiotic, patient did not improve and he returned to ED on 8/19.  Also reported vomiting and watery diarrhea.  In the ED, he was afebrile, heart rate and blood pressure elevated, he required 2 L oxygen by nasal cannula WBC count was elevated to 19.9, lactic acid normal COVID-19 PCR negative.  Influenza A/B PCR negative.   Chest x-ray compared to previous x-ray showed bilateral lung opacities, increased on the right and stable on the left  CTPA did not show any pulmonary embolism but showed moderate loculated appearing right pleural effusion and trace left pleural effusion, extensive associated atelectasis or consolidation particularly of the right lung base with more focal opacity in the superior segment right lower lobe consistent with multifocal infection and possible aspiration.  Patient was started on IV fluids, IV antibiotics  Admitted to hospitalist service  See below for details.  Subjective: Patient was seen and examined this morning.  On 2 L oxygen nasal cannula.  Feels better than at presentation.  Cough improving.  Pigtail chest tube to suction.  Assessment and plan: Sepsis, POA Multifocal pneumonia Large right parapneumonic effusion Presented with pneumonia not responding to doxycycline as an outpatient  CT scan with multifocal pneumonia greatest in right lower lobe with associated loculated pleural effusion.  Patient was started on IV antibiotics.  Blood culture negative.  Strep pneumo antigen negative.   8/23, because of persistent  leukocytosis and low-grade fever, checks x-ray was repeated on 8/23.  It showed large right pleural effusion which is worse than previous x-ray few days ago.   8/25, ultrasound-guided thoracentesis and pigtail chest tube was placed by IR Chest tube in suction.  Patient remains on supplemental oxygen. Currently on IV cefepime. Noted a plan from PCCM to give pleural attic today.  Follow-up chest x-ray.  Pending pleural fluid culture report. Continue Mucinex, incentive spirometry Recent Labs  Lab 10/17/21 0456 10/18/21 0454 10/19/21 0458 10/20/21 0530 10/21/21 0532 10/22/21 0509  WBC 21.4* 26.9* 24.7* 24.3* 21.7*  --   PROCALCITON  --   --  1.13  --  0.71 0.36    Acute respiratory failure with hypoxia Remains on 2 to 3 L oxygen by nasal cannula Check ambulatory oxygen requirement 8/23, echocardiogram and ultrasound duplex lower extremities unremarkable  Hemoptysis Related to pneumonia Continue to monitor   Acute metabolic encephalopathy, POA Patient was confused at the time of admission probably due to pneumonia.   mental status appears now to be back at his typical baseline.   Essential hypertension, poorly controlled Blood pressure notably elevated to 191/127 on admission.  Was previously on amlodipine but has been noncompliant. This hospitalization, he was initiated on multiple blood pressure meds.  Gradually improving blood pressure.   Continue Coreg 25 mg twice daily, amlodipine to 10 mg p.o. daily, hydralazine 25 mg PO q8h PRN SBP >170 or DBP >110   Insomnia Melatonin 3 mg p.o. nightly trazodone 100mg  PO qHS PRN   Morbid obesity  -Body mass index is 40.62 kg/m. Patient has been advised to make an attempt  to improve diet and exercise patterns to aid in weight loss.  Goals of care   Code Status: Full Code    Mobility: Encourage ambulation  Skin assessment:     Nutritional status:  Body mass index is 40.62 kg/m.          Diet:  Diet Order              Diet regular Room service appropriate? Yes; Fluid consistency: Thin  Diet effective now                   DVT prophylaxis: Start Lovenox subcu    Antimicrobials: IV cefepime. Fluid: None Consultants: Pulmonology Family Communication: None at bedside  Status is: Inpatient  Continue in-hospital care because: Still remains on supplemental oxygen.  Pigtail chest tube on the right side.  Pending clearance from IR and PCCM Level of care: Telemetry   Dispo: The patient is from: Home              Anticipated d/c is to: Home in 1 to 2 days              Patient currently is not medically stable to d/c.   Difficult to place patient No     Infusions:   ceFEPime (MAXIPIME) IV 2 g (10/23/21 7893)    Scheduled Meds:  amLODipine  10 mg Oral Daily   carvedilol  25 mg Oral BID WC   enoxaparin (LOVENOX) injection  60 mg Subcutaneous Q24H   guaiFENesin  1,200 mg Oral BID   melatonin  3 mg Oral QHS    PRN meds: acetaminophen, guaiFENesin-dextromethorphan, hydrALAZINE, HYDROcodone bit-homatropine, HYDROmorphone (DILAUDID) injection, ipratropium-albuterol, ondansetron **OR** ondansetron (ZOFRAN) IV, oxyCODONE, senna-docusate, traZODone   Antimicrobials: Anti-infectives (From admission, onward)    Start     Dose/Rate Route Frequency Ordered Stop   10/21/21 1300  ceFEPIme (MAXIPIME) 2 g in sodium chloride 0.9 % 100 mL IVPB        2 g 200 mL/hr over 30 Minutes Intravenous Every 8 hours 10/21/21 1210     10/20/21 1000  cefdinir (OMNICEF) capsule 300 mg  Status:  Discontinued        300 mg Oral Every 12 hours 10/20/21 0835 10/21/21 1140   10/20/21 1000  metroNIDAZOLE (FLAGYL) tablet 500 mg  Status:  Discontinued        500 mg Oral 2 times daily 10/20/21 0835 10/21/21 1140   10/16/21 2100  piperacillin-tazobactam (ZOSYN) IVPB 3.375 g  Status:  Discontinued        3.375 g 12.5 mL/hr over 240 Minutes Intravenous Every 8 hours 10/16/21 1912 10/20/21 0835   10/16/21 1400   piperacillin-tazobactam (ZOSYN) IVPB 3.375 g        3.375 g 12.5 mL/hr over 240 Minutes Intravenous  Once 10/16/21 1347 10/16/21 1735   10/16/21 1045  cefTRIAXone (ROCEPHIN) 2 g in sodium chloride 0.9 % 100 mL IVPB  Status:  Discontinued        2 g 200 mL/hr over 30 Minutes Intravenous Every 24 hours 10/16/21 1036 10/16/21 1902   10/16/21 1045  azithromycin (ZITHROMAX) 500 mg in sodium chloride 0.9 % 250 mL IVPB  Status:  Discontinued        500 mg 250 mL/hr over 60 Minutes Intravenous Every 24 hours 10/16/21 1036 10/16/21 1902       Objective: Vitals:   10/22/21 2119 10/23/21 0607  BP: (!) 140/93 (!) 144/96  Pulse: (!) 106 95  Resp: 18 18  Temp: 100.1 F (37.8 C) 98.9 F (37.2 C)  SpO2: 96% 97%    Intake/Output Summary (Last 24 hours) at 10/23/2021 1111 Last data filed at 10/23/2021 1035 Gross per 24 hour  Intake --  Output 1825 ml  Net -1825 ml    Filed Weights   10/19/21 0500 10/20/21 0457 10/22/21 0500  Weight: 132.1 kg 132 kg 132.1 kg   Weight change:  Body mass index is 40.62 kg/m.   Physical Exam: General exam: Pleasant, young African-American male.  On 2 L oxygen by nasal cannula at rest Skin: No rashes, lesions or ulcers. HEENT: Atraumatic, normocephalic, no obvious bleeding Lungs: No crackles or wheezing, diminished air entry in the right.  Pigtail chest tube in place CVS: Regular rate and rhythm, no murmur GI/Abd soft, nontender, nondistended, bowel sound present CNS: Alert, awake, oriented x3 Psychiatry: Mood appropriate Extremities: Pedal edema trace bilaterally  Data Review: I have personally reviewed the laboratory data and studies available.  F/u labs ordered Unresulted Labs (From admission, onward)     Start     Ordered   10/24/21 0500  Basic metabolic panel  Tomorrow morning,   R       Question:  Specimen collection method  Answer:  Lab=Lab collect   10/23/21 1022   10/24/21 0500  CBC  Tomorrow morning,   R       Question:  Specimen  collection method  Answer:  Lab=Lab collect   10/23/21 1022   10/21/21 1951  CD19 and CD20, Flow Cytometry  Once,   R        10/21/21 1951   10/21/21 1621  pH, Pleural Fluid (as Misc Test)  Once,   R       Comments: PLEURAL FLUID IS NOT AN ACCEPTED SPECIMEN SOURCE FOR ORDER "PH, BODY FLUID - [id number]"   THIS MISC. LAB ORDER SHOULD BE USED INSTEAD.  TESTING WILL BE PERFORMED AT [facility].  RESULT WILL DISPLAY IN RESULTS REVIEW "MISCELLANEOUS TEST" WITH A SCANNED REPORT LINK   Question:  Test name / description:  Answer:  PH, BODY FLUID / [id number] THROUGH [facility] TO [facility] - ORDER #[id number]   10/21/21 1622   10/21/21 1619  Amylase, Body Fluid (other)  Once,   R        10/21/21 1622   10/21/21 1616  Acid Fast Smear (AFB)  (AFB smear + Culture w reflexed sensitivities panel)  Once,   R       See Hyperspace for full Linked Orders Report.   10/21/21 1622   10/21/21 1616  Acid Fast Culture with reflexed sensitivities  (AFB smear + Culture w reflexed sensitivities panel)  Once,   R       See Hyperspace for full Linked Orders Report.   10/21/21 1622            Signed, Lorin Glass, MD [facility] 10/23/2021

## 2021-10-23 NOTE — Progress Notes (Signed)
NAME:  Todd Pena, MRN:  [id number], DOB:  [birth year], LOS: 7 ADMISSION DATE:  10/16/2021, CONSULTATION DATE:  10/17/2021 REFERRING MD:  Dr. [place], CHIEF COMPLAINT:  Short of breath  History of Present Illness:  36 yo male presented to ER on 8/16 with Rt sided back pain, fatigue, productive cough and dyspnea.  He was found to have Rt lower lobe infiltrate consistent with community acquired pneumonia, and discharged home with oral antibiotic and plan to follow up with his PCP.  He returned to the ER on 8/19 with progressive dyspnea, hemoptysis, and pleuritic chest pain.  He was admitted to the hospital and started on broad spectrum IV antibiotics.  CT chest showed loculated effusion and PCCM asked to assist with management of this.  Pertinent  Medical History  HTN  Significant Hospital Events: Including procedures, antibiotic start and stop dates in addition to other pertinent events   8/16 ER assessment >> dx with CAP and d/c home with doxycycline 8/19 Admit, start IV ABx 8/20 bedside u/s >> only small fluid pocket 8/21 IR assessment >> only small fluid pocket 8/23 progression of Rt pleural effusion  8/24 IR reconsulted >> 12 F Rt pigtail chest tube placed  8/25 start pleurolytic therapy  Studies:  CT angio chest 8/19 >> moderate loculated Rt pleural effusion, trace Lt pleural effusion, extensive consolidation and ATX especially at Rt lung base Echo 8/23 >> EF 60 to 65%, mod LVH, aortic root 44 mm Rt pleural fluid 8/24 >> glucose 48, protein 5.3, LDH 1328, WBC 637 (93% neutrophils)  Interim History / Subjective:  Breathing better.  Decreased cough.  Decreased chest pain on Rt.  Objective   Blood pressure (!) 144/96, pulse 95, temperature 98.9 F (37.2 C), temperature source Oral, resp. rate 18, height 5\' 11"  (1.803 m), weight 132.1 kg, SpO2 97 %.        Intake/Output Summary (Last 24 hours) at 10/23/2021 1018 Last data filed at 10/23/2021 10/25/2021 Gross per 24 hour  Intake --   Output 600 ml  Net -600 ml   Filed Weights   10/19/21 0500 10/20/21 0457 10/22/21 0500  Weight: 132.1 kg 132 kg 132.1 kg    Examination:  General - alert Eyes - pupils reactive ENT - no sinus tenderness, no stridor Cardiac - regular rate/rhythm, no murmur Chest - better air movement on Rt Abdomen - soft, non tender, + bowel sounds Extremities - no cyanosis, clubbing, or edema Skin - no rashes Neuro - normal strength, moves extremities, follows commands Psych - normal mood and behavior  Assessment & Plan:   Rt complex parapneumonic effusion in setting of Rt lower lobar bacterial community acquired pneumonia. - Abx day 8, currently on cefepime - Rt pigtail catheter placed by IR on 8/24 - will give additional dose of pleurolytic on 8/26 - f/u CXR - f/u pleural fluid culture from 8/24  Aortic root dilation. Hx of HTN. - will need outpt assessment by cardiology   Thrombocytosis. - likely reactive in setting of infection - f/u CBC  Labs       Latest Ref Rng & Units 10/22/2021    5:09 AM 10/21/2021    5:32 AM 10/20/2021    5:30 AM  CMP  Glucose 70 - 99 mg/dL 10/22/2021  761  607   BUN 6 - 20 mg/dL 9  9  13    Creatinine 0.61 - 1.24 mg/dL 371   0.62   Sodium 135 - 145 mmol/L 136  141  131  Potassium 3.5 - 5.1 mmol/L 3.6  3.2  3.3   Chloride 98 - 111 mmol/L 98  99  95   CO2 22 - 32 mmol/L 32  30  25   Calcium 8.9 - 10.3 mg/dL 8.4  8.9  8.5   Total Protein 6.5 - 8.1 g/dL 6.9     Total Bilirubin 0.3 - 1.2 mg/dL 1.0     Alkaline Phos 38 - 126 U/L 76     AST 15 - 41 U/L 27     ALT 0 - 44 U/L 24          Latest Ref Rng & Units 10/21/2021    5:32 AM 10/20/2021    5:30 AM 10/19/2021    4:58 AM  CBC  WBC 4.0 - 10.5 K/uL 21.7  24.3  24.7   Hemoglobin 13.0 - 17.0 g/dL 96.7  89.3  81.0   Hematocrit 39.0 - 52.0 % 43.2  38.7  40.6   Platelets 150 - 400 K/uL 898  816  760     DG Chest Port 1 View  Result Date: 10/23/2021 CLINICAL DATA:  Pneumonia EXAM: PORTABLE CHEST 1  VIEW COMPARISON:  October 21, 2021 FINDINGS: The right chest tube remains, advanced slightly in the interval. A right-sided pleural effusion remains but is smaller. Persistent opacity in the right mid lower lung. The left lung is clear. The cardiomediastinal silhouette is unchanged. No pneumothorax. IMPRESSION: 1. The right chest tube remains in place, advanced slightly in the interval. 2. Persistent but smaller right pleural effusion. 3. Persistent opacity in the right mid lower lung. 4. No other changes. Electronically Signed   By: Gerome Sam III M.D.   On: 10/23/2021 08:29   DG CHEST PORT 1 VIEW  Result Date: 10/21/2021 CLINICAL DATA:  Right pleural effusion EXAM: PORTABLE CHEST 1 VIEW COMPARISON:  10/20/2021 FINDINGS: Right basilar pigtail chest tube has been placed. Large right pleural effusion persists, essentially unchanged from prior examination, with collapse of the right middle and lower lobes and compressive atelectasis of the right upper lobe. Mild left basilar atelectasis or infiltrate persists. No pneumothorax. Cardiac size within normal limits. Pulmonary vascularity is normal. IMPRESSION: Interval placement of right basilar pigtail chest tube. Persistent large right pleural effusion with collapse of the right middle and lower lobes and compressive atelectasis of the right upper lobe. Electronically Signed   By: Helyn Numbers M.D.   On: 10/21/2021 20:11    Signature:  Coralyn Helling, MD [facility] Pulmonary/Critical Care Pager - [phone] 10/23/2021, 10:18 AM

## 2021-10-23 NOTE — Progress Notes (Addendum)
RN unclamped patient chest tube at 1445 after medication infusion per MD order. Patient is comfortable with no complaints.

## 2021-10-24 ENCOUNTER — Inpatient Hospital Stay (HOSPITAL_COMMUNITY): Payer: Self-pay

## 2021-10-24 DIAGNOSIS — R042 Hemoptysis: Secondary | ICD-10-CM

## 2021-10-24 DIAGNOSIS — J1289 Other viral pneumonia: Secondary | ICD-10-CM

## 2021-10-24 LAB — CBC
HCT: 36.3 % — ABNORMAL LOW (ref 39.0–52.0)
Hemoglobin: 11.7 g/dL — ABNORMAL LOW (ref 13.0–17.0)
MCH: 28.6 pg (ref 26.0–34.0)
MCHC: 32.2 g/dL (ref 30.0–36.0)
MCV: 88.8 fL (ref 80.0–100.0)
Platelets: 927 10*3/uL (ref 150–400)
RBC: 4.09 MIL/uL — ABNORMAL LOW (ref 4.22–5.81)
RDW: 13.1 % (ref 11.5–15.5)
WBC: 20.4 10*3/uL — ABNORMAL HIGH (ref 4.0–10.5)
nRBC: 0 % (ref 0.0–0.2)

## 2021-10-24 LAB — BASIC METABOLIC PANEL
Anion gap: 9 (ref 5–15)
BUN: 9 mg/dL (ref 6–20)
CO2: 26 mmol/L (ref 22–32)
Calcium: 8.2 mg/dL — ABNORMAL LOW (ref 8.9–10.3)
Chloride: 102 mmol/L (ref 98–111)
Creatinine, Ser: 0.99 mg/dL (ref 0.61–1.24)
GFR, Estimated: >60 mL/min (ref >60)
Glucose, Bld: 130 mg/dL — ABNORMAL HIGH (ref 70–99)
Potassium: 3.9 mmol/L (ref 3.5–5.1)
Sodium: 137 mmol/L (ref 135–145)

## 2021-10-24 LAB — AMYLASE, BODY FLUID (OTHER): Amylase, Body Fluid: 57 U/L

## 2021-10-24 NOTE — Progress Notes (Signed)
NAME:  Todd Pena, MRN:  [id number], DOB:  1985/05/18, LOS: 8 ADMISSION DATE:  10/16/2021, CONSULTATION DATE:  10/17/2021 REFERRING MD:  Dr. [place], CHIEF COMPLAINT:  Short of breath  History of Present Illness:  36 yo male presented to ER on 8/16 with Rt sided back pain, fatigue, productive cough and dyspnea.  He was found to have Rt lower lobe infiltrate consistent with community acquired pneumonia, and discharged home with oral antibiotic and plan to follow up with his PCP.  He returned to the ER on 8/19 with progressive dyspnea, hemoptysis, and pleuritic chest pain.  He was admitted to the hospital and started on broad spectrum IV antibiotics.  CT chest showed loculated effusion and PCCM asked to assist with management of this.  Pertinent  Medical History  HTN  Significant Hospital Events: Including procedures, antibiotic start and stop dates in addition to other pertinent events   8/16 ER assessment >> dx with CAP and d/c home with doxycycline 8/19 Admit, start IV ABx 8/20 bedside u/s >> only small fluid pocket 8/21 IR assessment >> only small fluid pocket 8/23 progression of Rt pleural effusion  8/24 IR reconsulted >> 12 F Rt pigtail chest tube placed  8/25 start pleurolytic therapy 8/26 2nd pleurolytic therapy  Studies:  CT angio chest 8/19 >> moderate loculated Rt pleural effusion, trace Lt pleural effusion, extensive consolidation and ATX especially at Rt lung base Echo 8/23 >> EF 60 to 65%, mod LVH, aortic root 44 mm Rt pleural fluid 8/24 >> glucose 48, protein 5.3, LDH 1328, WBC 637 (93% neutrophils)  Interim History / Subjective:  He feels like he is almost back to normal.  Cough is intermittent with decreased sputum volume.  Able to take deeper breaths.  Transitioned off supplemental oxygen this morning.  Pleurovac changed out yesterday.  Objective   Blood pressure (!) 140/100, pulse 94, temperature 99.5 F (37.5 C), temperature source Oral, resp. rate (!) 21, height  5\' 11"  (1.803 m), weight 127 kg, SpO2 95 %.        Intake/Output Summary (Last 24 hours) at 10/24/2021 0926 Last data filed at 10/24/2021 0800 Gross per 24 hour  Intake 1077.92 ml  Output 3070 ml  Net -1992.08 ml   Filed Weights   10/20/21 0457 10/22/21 0500 10/24/21 0500  Weight: 132 kg 132.1 kg 127 kg    Examination:  General - alert Eyes - pupils reactive ENT - no sinus tenderness, no stridor Cardiac - regular rate/rhythm, no murmur Chest - better air movement, no wheeze Abdomen - soft, non tender, + bowel sounds Extremities - no cyanosis, clubbing, or edema Skin - no rashes Neuro - normal strength, moves extremities, follows commands Psych - normal mood and behavior   Assessment & Plan:   Rt complex parapneumonic effusion in setting of Rt lower lobar bacterial community acquired pneumonia. - Abx day 9, currently on cefepime - Rt pigtail catheter placed by IR on 8/24 - CXR looks better 8/27 >> don't think he needs additional pleurolytic therapy at this time - f/u CT chest without contrast on 8/28 >> if this looks better and pleural fluid output decreased, them might be ready to have chest tube removed soon - f/u pleural fluid culture and cytology from 8/24  Aortic root dilation. Hx of HTN. - will need outpt assessment by cardiology   Thrombocytosis. - likely reactive in setting of infection - f/u CBC  Labs       Latest Ref Rng & Units 10/22/2021  5:09 AM 10/21/2021    5:32 AM 10/20/2021    5:30 AM  CMP  Glucose 70 - 99 mg/dL 875  643  329   BUN 6 - 20 mg/dL 9  9  13    Creatinine 0.61 - 1.24 mg/dL  5.18  8.41   Sodium 135 - 145 mmol/L 136  141  131   Potassium 3.5 - 5.1 mmol/L 3.6  3.2  3.3   Chloride 98 - 111 mmol/L 98  99  95   CO2 22 - 32 mmol/L 32  30  25   Calcium 8.9 - 10.3 mg/dL 8.4  8.9  8.5   Total Protein 6.5 - 8.1 g/dL 6.9     Total Bilirubin 0.3 - 1.2 mg/dL 1.0     Alkaline Phos 38 - 126 U/L 76     AST 15 - 41 U/L 27     ALT 0 - 44  U/L 24          Latest Ref Rng & Units 10/21/2021    5:32 AM 10/20/2021    5:30 AM 10/19/2021    4:58 AM  CBC  WBC 4.0 - 10.5 K/uL 21.7  24.3  24.7   Hemoglobin 13.0 - 17.0 g/dL 10/21/2021  63.0  16.0   Hematocrit 39.0 - 52.0 % 43.2  38.7  40.6   Platelets 150 - 400 K/uL 898  816  760     DG Chest Port 1 View  Result Date: 10/24/2021 CLINICAL DATA:  Pneumonia. EXAM: PORTABLE CHEST 1 VIEW COMPARISON:  October 23 2021 FINDINGS: The right chest tube is stable. No pneumothorax. The left lung is clear. Opacity remains in the right base with probable associated small effusion. Stable cardiomediastinal silhouette. IMPRESSION: 1. Stable chest tube. 2. Small right pleural effusion.  Right basilar opacity, stable. 3. No other interval changes. Electronically Signed   By: 12-12-1988 III M.D.   On: 10/24/2021 07:58   DG Chest Port 1 View  Result Date: 10/23/2021 CLINICAL DATA:  Pneumonia EXAM: PORTABLE CHEST 1 VIEW COMPARISON:  October 21, 2021 FINDINGS: The right chest tube remains, advanced slightly in the interval. A right-sided pleural effusion remains but is smaller. Persistent opacity in the right mid lower lung. The left lung is clear. The cardiomediastinal silhouette is unchanged. No pneumothorax. IMPRESSION: 1. The right chest tube remains in place, advanced slightly in the interval. 2. Persistent but smaller right pleural effusion. 3. Persistent opacity in the right mid lower lung. 4. No other changes. Electronically Signed   By: October 23, 2021 III M.D.   On: 10/23/2021 08:29    Signature:  10/25/2021, MD [facility] Pulmonary/Critical Care Pager - [phone] 10/24/2021, 9:26 AM

## 2021-10-24 NOTE — Progress Notes (Signed)
Pharmacy Antibiotic Note  Todd Pena is a 36 y.o. male admitted on 10/16/2021 with pneumonia.  Pharmacy has been consulted for cefepime dosing for empyema by CCM.  Today, 10/24/21 Total abx D#9, cefepime D#4 -WBC remains elevated @ 20.4 -Tmax 100.8 -Renal function stable S/p TPA/dornase 8/25 & 8/26 Off supplemental oxygen  Plan: Continue Cefepime 2 g IV q8h Monitor renal function ? Transition to oral abx vs stop soon  Height: 5\' 11"  (180.3 cm) Weight: 127 kg (279 lb 15.8 oz) IBW/kg (Calculated) : 75.3  Temp (24hrs), Avg:99.3 F (37.4 C), Min:97.9 F (36.6 C), Max:100.8 F (38.2 C)  Recent Labs  Lab 10/18/21 0454 10/19/21 0458 10/20/21 0530 10/21/21 0532 10/22/21 0509 10/24/21 1103  WBC 26.9* 24.7* 24.3* 21.7*  --  20.4*  CREATININE 1.06 1.10 1.08 1.12 1.15 0.99     Estimated Creatinine Clearance: 141.4 mL/min (by C-G formula based on SCr of 0.99 mg/dL).    No Known Allergies  Antimicrobials this admission: Piperacillin/tazobactam 8/19 >> 8/23 Cefdinir + metronidazole 8/23 >> 8/24 Cefepime 8/24 >>  Dose adjustments this admission:  Microbiology results: 8/19 BCx: ngF 8/19 Sputum: Sample not acceptable for testing  8/21 resp panel: negative 8/24 AFB x 2 sent 8/24 pleural fluid: ngtd,   9/24, Pharm.D 10/24/2021 1:23 PM

## 2021-10-24 NOTE — Progress Notes (Signed)
SATURATION QUALIFICATIONS: (This note is used to comply with regulatory documentation for home oxygen)  Patient Saturations on Room Air at Rest = 96%  Patient Saturations on Room Air while Ambulating = 90-94%

## 2021-10-24 NOTE — Plan of Care (Signed)
  Problem: Health Behavior/Discharge Planning: Goal: Ability to manage health-related needs will improve Outcome: Progressing   Problem: Clinical Measurements: Goal: Ability to maintain clinical measurements within normal limits will improve Outcome: Progressing Goal: Will remain free from infection Outcome: Progressing Goal: Diagnostic test results will improve Outcome: Progressing Goal: Respiratory complications will improve Outcome: Progressing Goal: Cardiovascular complication will be avoided Outcome: Progressing   Problem: Activity: Goal: Risk for activity intolerance will decrease Outcome: Progressing   Problem: Nutrition: Goal: Adequate nutrition will be maintained Outcome: Progressing   Problem: Coping: Goal: Level of anxiety will decrease Outcome: Progressing   Problem: Elimination: Goal: Will not experience complications related to bowel motility Outcome: Progressing Goal: Will not experience complications related to urinary retention Outcome: Progressing   Problem: Pain Managment: Goal: General experience of comfort will improve Outcome: Progressing   Problem: Safety: Goal: Ability to remain free from injury will improve Outcome: Progressing   Problem: Skin Integrity: Goal: Risk for impaired skin integrity will decrease Outcome: Progressing   Problem: Activity: Goal: Ability to tolerate increased activity will improve Outcome: Progressing   Problem: Clinical Measurements: Goal: Ability to maintain a body temperature in the normal range will improve Outcome: Progressing   Problem: Respiratory: Goal: Ability to maintain adequate ventilation will improve Outcome: Progressing Goal: Ability to maintain a clear airway will improve Outcome: Progressing   

## 2021-10-24 NOTE — Progress Notes (Addendum)
PROGRESS NOTE  Todd Pena  DOB: 1985/12/20  PCP: Patient, No Pcp Per [id number]  DOA: 10/16/2021  LOS: 8 days  Hospital Day: 9  Brief narrative: Neo KAYLEE TODOROVICH is a 36 y.o. male with PMH significant for morbid obesity, hypertension 8/16, patient presented to the ED with complaint of cough, chest pain.  Chest x-ray showed right lower lobe pneumonia and he was discharged home with a course of doxycycline.  Despite compliance to the antibiotic, patient did not improve and he returned to ED on 8/19.  Also reported vomiting and watery diarrhea.  In the ED, he was afebrile, heart rate and blood pressure elevated, he required 2 L oxygen by nasal cannula WBC count was elevated to 19.9, lactic acid normal COVID-19 PCR negative.  Influenza A/B PCR negative.   Chest x-ray compared to previous x-ray showed bilateral lung opacities, increased on the right and stable on the left  CTPA did not show any pulmonary embolism but showed moderate loculated appearing right pleural effusion and trace left pleural effusion, extensive associated atelectasis or consolidation particularly of the right lung base with more focal opacity in the superior segment right lower lobe consistent with multifocal infection and possible aspiration.  Patient was started on IV fluids, IV antibiotics  Admitted to hospitalist service  See below for details.  Subjective: Patient was seen and examined this morning.  Sitting up in bed on room air.  Not in distress.  Feels better.  Chest tube remains in place.  Assessment and plan: Sepsis, POA Multifocal pneumonia Large right parapneumonic effusion Presented with pneumonia not responding to doxycycline as an outpatient  CT scan with multifocal pneumonia greatest in right lower lobe with associated loculated pleural effusion.  Patient was started on IV antibiotics.  Blood culture negative.  Strep pneumo antigen negative.   8/23, because of persistent leukocytosis and  low-grade fever, checks x-ray was repeated on 8/23.  It showed large right pleural effusion which is worse than previous x-ray few days ago.   8/25, ultrasound-guided thoracentesis and pigtail chest tube was placed by IR Chest tube in suction.  Patient remains on supplemental oxygen. Currently on IV cefepime.  Clinically improving.  Respiratory status improving.  On room air today. Noted a plan from PCCM for repeat CT scan tomorrow. Continue Mucinex, incentive spirometry Recent Labs  Lab 10/18/21 0454 10/19/21 0458 10/20/21 0530 10/21/21 0532 10/22/21 0509 10/24/21 1103  WBC 26.9* 24.7* 24.3* 21.7*  --  20.4*  PROCALCITON  --  1.13  --  0.71 0.36  --    Acute respiratory failure with hypoxia Initially required high flow oxygen.  Currently on room air at rest.  Check ambulatory oxygen requirement. 8/23, echocardiogram and ultrasound duplex lower extremities unremarkable  Hemoptysis Related to pneumonia Continue to monitor  Thrombocytosis Patient's platelet level is running significantly elevated as high as 927 today.  Probably acute phase reactant.  It seems his baseline likely from 2020 was also over 600.  May need evaluation by hematology as an outpatient. Recent Labs  Lab 10/18/21 0454 10/19/21 0458 10/20/21 0530 10/21/21 0532 10/24/21 1103  PLT 790* 760* 816* 898* 927*      Acute metabolic encephalopathy, POA Patient was confused at the time of admission probably due to pneumonia.   mental status appears now to be back at his typical baseline.   Essential hypertension, poorly controlled Blood pressure notably elevated to 191/127 on admission.  Was previously on amlodipine but has been noncompliant. This hospitalization, he was initiated  on multiple blood pressure meds.  Gradually improving blood pressure.   Continue Coreg 25 mg twice daily, amlodipine to 10 mg p.o. daily, hydralazine 25 mg PO q8h PRN SBP >170 or DBP >110   Insomnia Melatonin 3 mg p.o.  nightly trazodone 100mg  PO qHS PRN   Morbid obesity  -Body mass index is 39.05 kg/m. Patient has been advised to make an attempt to improve diet and exercise patterns to aid in weight loss.  Goals of care   Code Status: Full Code    Mobility: Encourage ambulation  Skin assessment:     Nutritional status:  Body mass index is 39.05 kg/m.          Diet:  Diet Order             Diet regular Room service appropriate? Yes; Fluid consistency: Thin  Diet effective now                   DVT prophylaxis: Start Lovenox subcu    Antimicrobials: IV cefepime. Fluid: None Consultants: Pulmonology Family Communication: None at bedside  Status is: Inpatient  Continue in-hospital care because: Still remains on supplemental oxygen.  Pigtail chest tube on the right side.  Pending clearance from IR and PCCM Level of care: Telemetry   Dispo: The patient is from: Home              Anticipated d/c is to: Home in 1 to 2 days              Patient currently is not medically stable to d/c.   Difficult to place patient No    Infusions:   ceFEPime (MAXIPIME) IV Stopped (10/24/21 1457)    Scheduled Meds:  amLODipine  10 mg Oral Daily   carvedilol  25 mg Oral BID WC   enoxaparin (LOVENOX) injection  60 mg Subcutaneous Q24H   guaiFENesin  1,200 mg Oral BID   melatonin  3 mg Oral QHS    PRN meds: acetaminophen, guaiFENesin-dextromethorphan, hydrALAZINE, HYDROcodone bit-homatropine, HYDROmorphone (DILAUDID) injection, ipratropium-albuterol, ondansetron **OR** ondansetron (ZOFRAN) IV, oxyCODONE, senna-docusate, traZODone   Antimicrobials: Anti-infectives (From admission, onward)    Start     Dose/Rate Route Frequency Ordered Stop   10/21/21 1300  ceFEPIme (MAXIPIME) 2 g in sodium chloride 0.9 % 100 mL IVPB        2 g 200 mL/hr over 30 Minutes Intravenous Every 8 hours 10/21/21 1210     10/20/21 1000  cefdinir (OMNICEF) capsule 300 mg  Status:  Discontinued        300 mg  Oral Every 12 hours 10/20/21 0835 10/21/21 1140   10/20/21 1000  metroNIDAZOLE (FLAGYL) tablet 500 mg  Status:  Discontinued        500 mg Oral 2 times daily 10/20/21 0835 10/21/21 1140   10/16/21 2100  piperacillin-tazobactam (ZOSYN) IVPB 3.375 g  Status:  Discontinued        3.375 g 12.5 mL/hr over 240 Minutes Intravenous Every 8 hours 10/16/21 1912 10/20/21 0835   10/16/21 1400  piperacillin-tazobactam (ZOSYN) IVPB 3.375 g        3.375 g 12.5 mL/hr over 240 Minutes Intravenous  Once 10/16/21 1347 10/16/21 1735   10/16/21 1045  cefTRIAXone (ROCEPHIN) 2 g in sodium chloride 0.9 % 100 mL IVPB  Status:  Discontinued        2 g 200 mL/hr over 30 Minutes Intravenous Every 24 hours 10/16/21 1036 10/16/21 1902   10/16/21 1045  azithromycin (ZITHROMAX) 500  mg in sodium chloride 0.9 % 250 mL IVPB  Status:  Discontinued        500 mg 250 mL/hr over 60 Minutes Intravenous Every 24 hours 10/16/21 1036 10/16/21 1902       Objective: Vitals:   10/24/21 1010 10/24/21 1438  BP:  132/89  Pulse:  95  Resp:  20  Temp: 97.9 F (36.6 C) 99.6 F (37.6 C)  SpO2:  94%    Intake/Output Summary (Last 24 hours) at 10/24/2021 1540 Last data filed at 10/24/2021 1500 Gross per 24 hour  Intake 1517.92 ml  Output 2095 ml  Net -577.08 ml   Filed Weights   10/20/21 0457 10/22/21 0500 10/24/21 0500  Weight: 132 kg 132.1 kg 127 kg   Weight change:  Body mass index is 39.05 kg/m.   Physical Exam: General exam: Pleasant, young African-American male.  On room air. Skin: No rashes, lesions or ulcers. HEENT: Atraumatic, normocephalic, no obvious bleeding Lungs: No crackles or wheezing, diminished air entry in the right.  Pigtail chest tube in place CVS: Regular rate and rhythm, no murmur GI/Abd soft, nontender, nondistended, bowel sound present CNS: Alert, awake, oriented x3 Psychiatry: Mood appropriate Extremities: Pedal edema trace bilaterally  Data Review: I have personally reviewed the laboratory  data and studies available.  F/u labs ordered Unresulted Labs (From admission, onward)     Start     Ordered   10/25/21 0500  Basic metabolic panel  Tomorrow morning,   R       Question:  Specimen collection method  Answer:  Lab=Lab collect   10/24/21 0930   10/24/21 1103  Pathologist smear review  Once,   R        10/24/21 1103   10/24/21 0931  CBC  Once,   R       Question:  Specimen collection method  Answer:  Lab=Lab collect   10/24/21 0930   10/21/21 1951  CD19 and CD20, Flow Cytometry  Once,   R        10/21/21 1951   10/21/21 1621  pH, Pleural Fluid (as Misc Test)  Once,   R       Comments: PLEURAL FLUID IS NOT AN ACCEPTED SPECIMEN SOURCE FOR ORDER "PH, BODY FLUID - [id number]"   THIS MISC. LAB ORDER SHOULD BE USED INSTEAD.  TESTING WILL BE PERFORMED AT [facility].  RESULT WILL DISPLAY IN RESULTS REVIEW "MISCELLANEOUS TEST" WITH A SCANNED REPORT LINK   Question:  Test name / description:  Answer:  PH, BODY FLUID / [id number] THROUGH [facility] TO [facility] - ORDER #[id number]   10/21/21 1622   10/21/21 1616  Acid Fast Smear (AFB)  (AFB smear + Culture w reflexed sensitivities panel)  Once,   R       See Hyperspace for full Linked Orders Report.   10/21/21 1622   10/21/21 1616  Acid Fast Culture with reflexed sensitivities  (AFB smear + Culture w reflexed sensitivities panel)  Once,   R       See Hyperspace for full Linked Orders Report.   10/21/21 1622            Signed, Lorin Glass, MD [facility] 10/24/2021

## 2021-10-25 ENCOUNTER — Encounter (HOSPITAL_COMMUNITY): Payer: Self-pay | Admitting: Internal Medicine

## 2021-10-25 ENCOUNTER — Inpatient Hospital Stay (HOSPITAL_COMMUNITY): Payer: Self-pay

## 2021-10-25 LAB — ACID FAST SMEAR (AFB, MYCOBACTERIA): Acid Fast Smear: NEGATIVE

## 2021-10-25 LAB — BASIC METABOLIC PANEL
Anion gap: 10 (ref 5–15)
BUN: 8 mg/dL (ref 6–20)
CO2: 24 mmol/L (ref 22–32)
Calcium: 8.5 mg/dL — ABNORMAL LOW (ref 8.9–10.3)
Chloride: 103 mmol/L (ref 98–111)
Creatinine, Ser: 0.94 mg/dL (ref 0.61–1.24)
GFR, Estimated: >60 mL/min (ref >60)
Glucose, Bld: 95 mg/dL (ref 70–99)
Potassium: 4.7 mmol/L (ref 3.5–5.1)
Sodium: 137 mmol/L (ref 135–145)

## 2021-10-25 LAB — BODY FLUID CULTURE W GRAM STAIN: Culture: NO GROWTH

## 2021-10-25 LAB — CYTOLOGY - NON PAP

## 2021-10-25 MED ORDER — STERILE WATER FOR INJECTION IJ SOLN
5.0000 mg | Freq: Once | INTRAMUSCULAR | Status: AC
  Administered 2021-10-25: 5 mg via INTRAPLEURAL
  Filled 2021-10-25: qty 5

## 2021-10-25 MED ORDER — SODIUM CHLORIDE 0.9% FLUSH
10.0000 mL | Freq: Three times a day (TID) | INTRAVENOUS | Status: DC
  Administered 2021-10-25 – 2021-10-28 (×8): 10 mL via INTRAPLEURAL

## 2021-10-25 MED ORDER — SODIUM CHLORIDE 0.9% FLUSH
10.0000 mL | Freq: Three times a day (TID) | INTRAVENOUS | Status: DC
  Administered 2021-10-25 – 2021-10-28 (×6): 10 mL via INTRAPLEURAL

## 2021-10-25 MED ORDER — SODIUM CHLORIDE (PF) 0.9 % IJ SOLN
10.0000 mg | Freq: Once | INTRAMUSCULAR | Status: AC
  Administered 2021-10-25: 10 mg via INTRAPLEURAL
  Filled 2021-10-25: qty 10

## 2021-10-25 NOTE — Progress Notes (Addendum)
Referring Physician(s): Lavinia Sharps  Supervising Physician: Irish Lack  Patient Status:  [facility] - In-pt  Chief Complaint:  S/p chest tube placement for empyema  Subjective:  Pt sitting up on edge of bed. He states that he is frustrated that he is still in hospital. He is upset that he is missing family events with children and fiance. Pt states that he just wants to go home. He denies fevers, chills. He states he has no appetite but is eating small amounts. He has no other complaints.   Allergies: Patient has no known allergies.  Medications: Prior to Admission medications   Medication Sig Start Date End Date Taking? Authorizing Provider  amLODipine (NORVASC) 5 MG tablet Take 1 tablet (5 mg total) by mouth daily. 12/31/18 01/30/19  Long, Arlyss Repress, MD     Vital Signs: BP (!) 142/97 (BP Location: Left Arm)   Pulse 89   Temp 98.9 F (37.2 C) (Oral)   Resp 20   Ht 5\' 11"  (1.803 m)   Wt 284 lb 9.8 oz (129.1 kg)   SpO2 95%   BMI 39.70 kg/m   Physical Exam Constitutional:      General: He is not in acute distress.    Appearance: Normal appearance. He is not ill-appearing.  HENT:     Head: Normocephalic and atraumatic.  Eyes:     Extraocular Movements: Extraocular movements intact.     Pupils: Pupils are equal, round, and reactive to light.  Pulmonary:     Effort: Pulmonary effort is normal. No respiratory distress.     Comments: Site unremarkable with sutures in place. ~ 400 cc amber OP in Pleurex. Dressing C/D/I.   Skin:    General: Skin is warm and dry.  Neurological:     Mental Status: He is alert and oriented to person, place, and time.  Psychiatric:        Mood and Affect: Mood normal.        Behavior: Behavior normal.        Thought Content: Thought content normal.        Judgment: Judgment normal.     Imaging: CT CHEST WO CONTRAST  Result Date: 10/25/2021 CLINICAL DATA:  Empyema. EXAM: CT CHEST WITHOUT CONTRAST TECHNIQUE: Multidetector CT imaging  of the chest was performed following the standard protocol without IV contrast. RADIATION DOSE REDUCTION: This exam was performed according to the departmental dose-optimization program which includes automated exposure control, adjustment of the mA and/or kV according to patient size and/or use of iterative reconstruction technique. COMPARISON:  October 16, 2021. FINDINGS: Cardiovascular: No significant vascular findings. Normal heart size. No pericardial effusion. Mediastinum/Nodes: No enlarged mediastinal or axillary lymph nodes. Thyroid gland, trachea, and esophagus demonstrate no significant findings. Lungs/Pleura: No pneumothorax is noted. Left lung is clear. There is been interval placement of pigtail drainage catheter into the right lung base. There is continued presence of loculated pleural effusion with associated atelectasis or pneumonia as noted on prior exam, consistent with empyema. This is not significantly changed compared to prior exam. Upper Abdomen: No acute abnormality. Musculoskeletal: No chest wall mass or suspicious bone lesions identified. IMPRESSION: Interval placement of pigtail drainage catheter in the right lung base. Grossly stable loculated right pleural effusion is noted with associated right basilar atelectasis or infiltrate, consistent with empyema as noted on prior exam. Electronically Signed   By: October 18, 2021 M.D.   On: 10/25/2021 10:50   CT PERC PLEURAL DRAIN W/INDWELL CATH W/IMG GUIDE  Result Date: 10/25/2021 INDICATION: 36 year old male with loculated pleural effusion concerning for empyema. EXAM: CT-guided chest tube placement TECHNIQUE: Multidetector CT imaging of the chest was performed following the standard protocol without IV contrast. RADIATION DOSE REDUCTION: This exam was performed according to the departmental dose-optimization program which includes automated exposure control, adjustment of the mA and/or kV according to patient size and/or use of iterative  reconstruction technique. MEDICATIONS: The patient is currently admitted to the hospital and receiving intravenous antibiotics. The antibiotics were administered within an appropriate time frame prior to the initiation of the procedure. ANESTHESIA/SEDATION: Moderate (conscious) sedation was employed during this procedure. A total of Versed 2 mg and Fentanyl 150 mcg was administered intravenously by the radiology nurse. Total intra-service moderate Sedation Time: 20 minutes. The patient's level of consciousness and vital signs were monitored continuously by radiology nursing throughout the procedure under my direct supervision. COMPLICATIONS: None immediate. PROCEDURE: Informed written consent was obtained from the patient after a thorough discussion of the procedural risks, benefits and alternatives. All questions were addressed. Maximal Sterile Barrier Technique was utilized including caps, mask, sterile gowns, sterile gloves, sterile drape, hand hygiene and skin antiseptic. A timeout was performed prior to the initiation of the procedure. Axial CT imaging demonstrates a highly loculated right-sided pleural effusion. A suitable skin entry site was identified at the level of the nipple in the mid axillary line. The skin was marked and then prepped and draped in the standard fashion using chlorhexidine skin prep. Local anesthesia was attained by infiltration with 1% lidocaine. Under intermittent CT guidance, an 18 gauge trocar needle was advanced over the rib and into the pleural space. A 0.035 wire was then advanced into the pleural space. The percutaneous tract was dilated to 12 [place]. A Cook 12 [place] all-purpose drainage catheter modified with additional sideholes was advanced over the wire and into the pleural space. The drainage catheter was connected to an atrium and low wall suction. The catheter was secured to the skin with 0 Prolene suture. Follow-up CT imaging demonstrates a well-positioned drainage  catheter. Initial drainage through the tube was turbid straw-colored fluid. IMPRESSION: Successful placement of 12 French right-sided percutaneous chest tube using CT guidance. Electronically Signed   By: Malachy Moan M.D.   On: 10/25/2021 09:36   DG Chest Port 1 View  Result Date: 10/24/2021 CLINICAL DATA:  Pneumonia. EXAM: PORTABLE CHEST 1 VIEW COMPARISON:  October 23 2021 FINDINGS: The right chest tube is stable. No pneumothorax. The left lung is clear. Opacity remains in the right base with probable associated small effusion. Stable cardiomediastinal silhouette. IMPRESSION: 1. Stable chest tube. 2. Small right pleural effusion.  Right basilar opacity, stable. 3. No other interval changes. Electronically Signed   By: Gerome Sam III M.D.   On: 10/24/2021 07:58   DG Chest Port 1 View  Result Date: 10/23/2021 CLINICAL DATA:  Pneumonia EXAM: PORTABLE CHEST 1 VIEW COMPARISON:  October 21, 2021 FINDINGS: The right chest tube remains, advanced slightly in the interval. A right-sided pleural effusion remains but is smaller. Persistent opacity in the right mid lower lung. The left lung is clear. The cardiomediastinal silhouette is unchanged. No pneumothorax. IMPRESSION: 1. The right chest tube remains in place, advanced slightly in the interval. 2. Persistent but smaller right pleural effusion. 3. Persistent opacity in the right mid lower lung. 4. No other changes. Electronically Signed   By: Gerome Sam III M.D.   On: 10/23/2021 08:29   DG CHEST PORT 1 VIEW  Result  Date: 10/21/2021 CLINICAL DATA:  Right pleural effusion EXAM: PORTABLE CHEST 1 VIEW COMPARISON:  10/20/2021 FINDINGS: Right basilar pigtail chest tube has been placed. Large right pleural effusion persists, essentially unchanged from prior examination, with collapse of the right middle and lower lobes and compressive atelectasis of the right upper lobe. Mild left basilar atelectasis or infiltrate persists. No pneumothorax. Cardiac size  within normal limits. Pulmonary vascularity is normal. IMPRESSION: Interval placement of right basilar pigtail chest tube. Persistent large right pleural effusion with collapse of the right middle and lower lobes and compressive atelectasis of the right upper lobe. Electronically Signed   By: Helyn Numbers M.D.   On: 10/21/2021 20:11    Labs:  CBC: Recent Labs    10/19/21 0458 10/20/21 0530 10/21/21 0532 10/24/21 1103  WBC 24.7* 24.3* 21.7* 20.4*  HGB 13.7 12.8* 14.1 11.7*  HCT 40.6 38.7* 43.2 36.3*  PLT 760* 816* 898* 927*    COAGS: Recent Labs    10/16/21 0946  INR 1.2  APTT 40*    BMP: Recent Labs    10/21/21 0532 10/22/21 0509 10/24/21 1103 10/25/21 0440  NA 141 136 137 137  K 3.2* 3.6 3.9 4.7  CL 99 98 102 103  CO2 30 32 26 24  GLUCOSE 108* 114* 130* 95  BUN 9 9 9 8   CALCIUM 8.9 8.4* 8.2* 8.5*  CREATININE 1.12 1.15 0.99 0.94  GFRNONAA >60 >60 >60 >60    LIVER FUNCTION TESTS: Recent Labs    10/16/21 0946 10/19/21 0458 10/22/21 0509  BILITOT 1.3* 1.6* 1.0  AST 17 16 27   ALT 23 19 24   ALKPHOS 57 73 76  PROT 6.4* 7.3 6.9  ALBUMIN 2.8* 3.3* 2.8*    Assessment and Plan:  S/p chest tube placement for empyema with Dr. 10/24/21 10/21/21  Subjective:  Pt sitting up on edge of bed. He states that he is frustrated that he is still in hospital. He is upset that he is missing family events with children and fiance. He is in no distress.  PCCM NP at bedside to administer lytics into right CT for complicated pleural effusion.   WBC 20.4 (21.7), afebrile  Approximately 400 cc OP in Pleurex.   PCCM managing CT drain. IR following peripherally. Please call IR with questions/concerns.    Electronically Signed: , NP 10/25/2021, 2:27 PM   I spent a total of 15 Minutes at the the patient's bedside AND on the patient's hospital floor or unit, greater than 50% of which was counseling/coordinating care for empyema.

## 2021-10-25 NOTE — Procedures (Signed)
Pleural Fibrinolytic Administration Procedure Note  MARQUS MACPHEE  [id number]  12/22/1985  Date:10/25/21  Time:2:26 PM   Provider Performing:Brooke Lodema Hong   Procedure: Pleural Fibrinolysis Subsequent day (29518) #3  Indication(s) Fibrinolysis of complicated pleural effusion  Consent Risks of the procedure as well as the alternatives and risks of each were explained to the patient and/or caregiver.  Consent for the procedure was obtained.   Anesthesia None   Time Out Verified patient identification, verified procedure, site/side was marked, verified correct patient position, special equipment/implants available, medications/allergies/relevant history reviewed, required imaging and test results available.   Sterile Technique Hand hygiene, gloves   Procedure Description Existing pleural catheter was cleaned and accessed in sterile manner.  10mg  of tPA in 30cc of saline and 5mg  of dornase in 30cc of sterile water were injected into pleural space using existing pleural catheter.  Catheter will be clamped for 1 hour and then placed back to suction.  Patient verbalizes understanding of frequent position changes.  Bedside RN, will unclamp at 1515  Site with dressing minimally attached.  Site was redressed.   Complications/Tolerance Mild pain> patient getting pain meds   EBL None   Specimen(s) None     , MSN, AG-ACNP-BC [facility] 10/25/2021, 2:28 PM  See Amion for pager If no response to pager, please call PCCM consult pager After 7:00 pm call Elink

## 2021-10-25 NOTE — Progress Notes (Signed)
Mobility Specialist Cancellation/Refusal Note:    10/25/21 1151  Mobility  Activity Refused mobility     Reason for Cancellation/Refusal: Pt declined mobility at this time. Pt not in the mood. Will check back as schedule permits.      [facility]

## 2021-10-25 NOTE — Progress Notes (Addendum)
PROGRESS NOTE  Todd Pena  DOB: [birth year]  PCP: Patient, No Pcp Per ZOX:[id number]  DOA: 10/16/2021  LOS: 9 days  Hospital Day: 10  Brief narrative: JONATHA GAGEN is a 36 y.o. male with PMH significant for morbid obesity, hypertension 8/16, patient presented to the ED with complaint of cough, chest pain.  Chest x-ray showed right lower lobe pneumonia and he was discharged home with a course of doxycycline.  Despite compliance to the antibiotic, patient did not improve and he returned to ED on 8/19.  Also reported vomiting and watery diarrhea.  In the ED, he was afebrile, heart rate and blood pressure elevated, he required 2 L oxygen by nasal cannula WBC count was elevated to 19.9, lactic acid normal COVID-19 PCR negative.  Influenza A/B PCR negative.   Chest x-ray compared to previous x-ray showed bilateral lung opacities, increased on the right and stable on the left  CTPA did not show any pulmonary embolism but showed moderate loculated appearing right pleural effusion and trace left pleural effusion, extensive associated atelectasis or consolidation particularly of the right lung base with more focal opacity in the superior segment right lower lobe consistent with multifocal infection and possible aspiration.  Patient was started on IV fluids, IV antibiotics  Admitted to hospitalist service  See below for details.  Subjective: Patient was seen and examined this morning.  Sitting up in bed not in distress.  On room air today.  Has chest tube to suction.Fredna Dow he wants to go home.  Assessment and plan: Sepsis, POA Multifocal pneumonia Large right empyema Presented with pneumonia not responding to doxycycline as an outpatient  CT scan with multifocal pneumonia greatest in right lower lobe with associated loculated pleural effusion.  Patient was started on IV antibiotics.  Blood culture negative.  Strep pneumo antigen negative.   8/23, because of persistent leukocytosis and  low-grade fever, checks x-ray was repeated on 8/23.  It showed large right pleural effusion which is worse than previous x-ray few days ago.   8/25, ultrasound-guided thoracentesis and pigtail chest tube was placed by IR Chest tube in suction.  Patient remains on supplemental oxygen. Currently on IV cefepime.  Clinically improving.  Respiratory status improving.  On room air today.  But patient still having episodic spikes of temperature, at 101.1 last night.  WBC count still remains elevated. 8/28, repeat CT scan today shows grossly stable loculated right pleural effusion with right basilar atelectasis.  Will discuss with PCCM and IR Continue Mucinex, incentive spirometry Recent Labs  Lab 10/19/21 0458 10/20/21 0530 10/21/21 0532 10/22/21 0509 10/24/21 1103  WBC 24.7* 24.3* 21.7*  --  20.4*  PROCALCITON 1.13  --  0.71 0.36  --     Acute respiratory failure with hypoxia Initially required high flow oxygen.  Currently on room air at rest.  Yesterday, patient was able to ambulate on the hallway without supplemental oxygen.   8/23, echocardiogram and ultrasound duplex lower extremities unremarkable  Hemoptysis Related to pneumonia Continue to monitor  Thrombocytosis Patient's platelet level is running significantly elevated as high as 927 today.  Probably acute phase reactant.  It seems his baseline likely from 2020 was also over 600.  May need evaluation by hematology as an outpatient. Recent Labs  Lab 10/19/21 0458 10/20/21 0530 10/21/21 0532 10/24/21 1103  PLT 760* 816* 898* 927*      Acute metabolic encephalopathy, POA Patient was confused at the time of admission probably due to pneumonia.   mental  status appears now to be back at his typical baseline.   Essential hypertension, poorly controlled Blood pressure currently remains controlled on Coreg 25 mg twice daily, amlodipine to 10 mg p.o. daily and as needed hydralazine.   Insomnia Melatonin 3 mg p.o. nightly,  trazodone 100mg  PO qHS PRN   Morbid obesity  -Body mass index is 39.7 kg/m. Patient has been advised to make an attempt to improve diet and exercise patterns to aid in weight loss.  Goals of care   Code Status: Full Code    Mobility: Encourage ambulation  Skin assessment:     Nutritional status:  Body mass index is 39.7 kg/m.          Diet:  Diet Order             Diet regular Room service appropriate? Yes; Fluid consistency: Thin  Diet effective now                   DVT prophylaxis: Start Lovenox subcu    Antimicrobials: IV cefepime. Fluid: None Consultants: Pulmonology Family Communication: None at bedside  Status is: Inpatient  Continue in-hospital care because: Pigtail chest tube remains in place.  Repeat chest CT with persistent empyema Level of care: Telemetry   Dispo: The patient is from: Home              Anticipated d/c is to: Home after clearance with pulmonary              Patient currently is not medically stable to d/c.   Difficult to place patient No    Infusions:   ceFEPime (MAXIPIME) IV 2 g (10/25/21 10/27/21)    Scheduled Meds:  alteplase (CATHFLO ACTIVASE) 10 mg in sodium chloride (PF) 0.9 % 30 mL  10 mg Intrapleural Once   And   dornase alfa (PULMOZYME) 5 mg in sterile water (preservative free) 30 mL  5 mg Intrapleural Once   amLODipine  10 mg Oral Daily   carvedilol  25 mg Oral BID WC   enoxaparin (LOVENOX) injection  60 mg Subcutaneous Q24H   guaiFENesin  1,200 mg Oral BID   melatonin  3 mg Oral QHS   sodium chloride flush  10 mL Intrapleural Q8H    PRN meds: acetaminophen, guaiFENesin-dextromethorphan, hydrALAZINE, HYDROcodone bit-homatropine, HYDROmorphone (DILAUDID) injection, ipratropium-albuterol, ondansetron **OR** ondansetron (ZOFRAN) IV, oxyCODONE, senna-docusate, traZODone   Antimicrobials: Anti-infectives (From admission, onward)    Start     Dose/Rate Route Frequency Ordered Stop   10/21/21 1300  ceFEPIme  (MAXIPIME) 2 g in sodium chloride 0.9 % 100 mL IVPB        2 g 200 mL/hr over 30 Minutes Intravenous Every 8 hours 10/21/21 1210     10/20/21 1000  cefdinir (OMNICEF) capsule 300 mg  Status:  Discontinued        300 mg Oral Every 12 hours 10/20/21 0835 10/21/21 1140   10/20/21 1000  metroNIDAZOLE (FLAGYL) tablet 500 mg  Status:  Discontinued        500 mg Oral 2 times daily 10/20/21 0835 10/21/21 1140   10/16/21 2100  piperacillin-tazobactam (ZOSYN) IVPB 3.375 g  Status:  Discontinued        3.375 g 12.5 mL/hr over 240 Minutes Intravenous Every 8 hours 10/16/21 1912 10/20/21 0835   10/16/21 1400  piperacillin-tazobactam (ZOSYN) IVPB 3.375 g        3.375 g 12.5 mL/hr over 240 Minutes Intravenous  Once 10/16/21 1347 10/16/21 1735   10/16/21 1045  cefTRIAXone (ROCEPHIN) 2 g in sodium chloride 0.9 % 100 mL IVPB  Status:  Discontinued        2 g 200 mL/hr over 30 Minutes Intravenous Every 24 hours 10/16/21 1036 10/16/21 1902   10/16/21 1045  azithromycin (ZITHROMAX) 500 mg in sodium chloride 0.9 % 250 mL IVPB  Status:  Discontinued        500 mg 250 mL/hr over 60 Minutes Intravenous Every 24 hours 10/16/21 1036 10/16/21 1902       Objective: Vitals:   10/24/21 1929 10/25/21 0419  BP: 130/84 (!) 142/97  Pulse: 95 89  Resp: 20 20  Temp: (!) 101.1 F (38.4 C) 98.9 F (37.2 C)  SpO2: 95% 95%    Intake/Output Summary (Last 24 hours) at 10/25/2021 1256 Last data filed at 10/25/2021 1000 Gross per 24 hour  Intake 340 ml  Output 875 ml  Net -535 ml    Filed Weights   10/22/21 0500 10/24/21 0500 10/25/21 0500  Weight: 132.1 kg 127 kg 129.1 kg   Weight change: 2.1 kg Body mass index is 39.7 kg/m.   Physical Exam: General exam: Pleasant, young African-American male.  On room air. Skin: No rashes, lesions or ulcers. HEENT: Atraumatic, normocephalic, no obvious bleeding Lungs: No crackles or wheezing, diminished air entry in the right.  Pigtail chest tube in place CVS: Regular  rate and rhythm, no murmur GI/Abd soft, nontender, nondistended, bowel sound present CNS: Alert, awake, oriented x3 Psychiatry: Sad affect Extremities: Pedal edema trace bilaterally  Data Review: I have personally reviewed the laboratory data and studies available.  F/u labs ordered Unresulted Labs (From admission, onward)     Start     Ordered   10/24/21 1103  Pathologist smear review  Once,   R        10/24/21 1103   10/21/21 1951  CD19 and CD20, Flow Cytometry  Once,   R        10/21/21 1951   10/21/21 1621  pH, Pleural Fluid (as Misc Test)  Once,   R       Comments: PLEURAL FLUID IS NOT AN ACCEPTED SPECIMEN SOURCE FOR ORDER "PH, BODY FLUID - [id number]"   THIS MISC. LAB ORDER SHOULD BE USED INSTEAD.  TESTING WILL BE PERFORMED AT [facility].  RESULT WILL DISPLAY IN RESULTS REVIEW "MISCELLANEOUS TEST" WITH A SCANNED REPORT LINK   Question:  Test name / description:  Answer:  PH, BODY FLUID / [id number] THROUGH [facility] TO [facility] - ORDER #[id number]   10/21/21 1622   10/21/21 1616  Acid Fast Smear (AFB)  (AFB smear + Culture w reflexed sensitivities panel)  Once,   R       See Hyperspace for full Linked Orders Report.   10/21/21 1622   10/21/21 1616  Acid Fast Culture with reflexed sensitivities  (AFB smear + Culture w reflexed sensitivities panel)  Once,   R       See Hyperspace for full Linked Orders Report.   10/21/21 1622            Signed, Lorin Glass, MD [facility] 10/25/2021

## 2021-10-25 NOTE — Progress Notes (Addendum)
NAME:  Todd Pena, MRN:  [id number], DOB:  03-19-1985, LOS: 9 ADMISSION DATE:  10/16/2021, CONSULTATION DATE:  10/17/2021 REFERRING MD:  Dr. [place], CHIEF COMPLAINT:  Short of breath  History of Present Illness:  36 yo male presented to ER on 8/16 with Rt sided back pain, fatigue, productive cough and dyspnea.  He was found to have Rt lower lobe infiltrate consistent with community acquired pneumonia, and discharged home with oral antibiotic and plan to follow up with his PCP.  He returned to the ER on 8/19 with progressive dyspnea, hemoptysis, and pleuritic chest pain.  He was admitted to the hospital and started on broad spectrum IV antibiotics.  CT chest showed loculated effusion and PCCM asked to assist with management of this.  Pertinent  Medical History  HTN  Significant Hospital Events: Including procedures, antibiotic start and stop dates in addition to other pertinent events   8/16 ER assessment >> dx with CAP and d/c home with doxycycline 8/19 Admit, start IV ABx 8/20 bedside u/s >> only small fluid pocket 8/21 IR assessment >> only small fluid pocket 8/23 progression of Rt pleural effusion  8/24 IR reconsulted >> 12 F Rt pigtail chest tube placed  8/25 start pleurolytic therapy 8/26 2nd pleurolytic therapy 8/27 off O2 8/28 repeat CT> mostly unchanged, 3rd pleurolytic therapy  Studies:  CT angio chest 8/19 >> moderate loculated Rt pleural effusion, trace Lt pleural effusion, extensive consolidation and ATX especially at Rt lung base Echo 8/23 >> EF 60 to 65%, mod LVH, aortic root 44 mm Rt pleural fluid 8/24 >> glucose 48, protein 5.3, LDH 1328, WBC 637 (93% neutrophils)  Interim History / Subjective:  Still intermittent cough Tmax 101.1 overnight Patient verbalizes difficulty being hospital this long> as he missed his child's birthday and then first day of school.   Repeat CT chest today> right loculated effusion mostly unchanged  Objective   Blood pressure (!)  142/97, pulse 89, temperature 98.9 F (37.2 C), temperature source Oral, resp. rate 20, height 5\' 11"  (1.803 m), weight 129.1 kg, SpO2 95 %.        Intake/Output Summary (Last 24 hours) at 10/25/2021 1426 Last data filed at 10/25/2021 1000 Gross per 24 hour  Intake 340 ml  Output 875 ml  Net -535 ml   Filed Weights   10/22/21 0500 10/24/21 0500 10/25/21 0500  Weight: 132.1 kg 127 kg 129.1 kg    Examination: General:  Adult male sitting upright in bed in NAD HEENT: MM pink/moist Neuro: Aox4, depressed mood CV: rr PULM:  non labored, clear, diminished in right base, R pigtail cath with minimal bloody drainage to 20cm sxn, no air leak GI: soft, bs+ Extremities: warm/dry, no LE edema   Assessment & Plan:   Rt complex parapneumonic effusion in setting of Rt lower lobar bacterial community acquired pneumonia. - Abx day 10, currently on cefepime> continues to spike fevers, WBC unchanged.   - Rt pigtail catheter placed by IR on 8/24 - repeat CT chest without significant change despite pleural lytics x 2.  Will repeat lytics today and likely consult TCTS> will discuss with attending - f/u pleural fluid culture negative.  Cytology from 8/24> pending     Remainder per primary team.    Labs       Latest Ref Rng & Units 10/25/2021    4:40 AM 10/24/2021   11:03 AM 10/22/2021    5:09 AM  CMP  Glucose 70 - 99 mg/dL 95  10/24/2021  114   BUN 6 - 20 mg/dL 8  9  9    Creatinine 0.61 - 1.24 mg/dL  8.33  8.25   Sodium 135 - 145 mmol/L 137  137  136   Potassium 3.5 - 5.1 mmol/L 4.7  3.9  3.6   Chloride 98 - 111 mmol/L 103  102  98   CO2 22 - 32 mmol/L 24  26  32   Calcium 8.9 - 10.3 mg/dL 8.5  8.2  8.4   Total Protein 6.5 - 8.1 g/dL   6.9   Total Bilirubin 0.3 - 1.2 mg/dL   1.0   Alkaline Phos 38 - 126 U/L   76   AST 15 - 41 U/L   27   ALT 0 - 44 U/L   24        Latest Ref Rng & Units 10/24/2021   11:03 AM 10/21/2021    5:32 AM 10/20/2021    5:30 AM  CBC  WBC 4.0 - 10.5 K/uL 20.4   21.7  24.3   Hemoglobin 13.0 - 17.0 g/dL 10/22/2021  97.6  73.4   Hematocrit 39.0 - 52.0 % 36.3  43.2  38.7   Platelets 150 - 400 K/uL 927  898  816     CT CHEST WO CONTRAST  Result Date: 10/25/2021 CLINICAL DATA:  Empyema. EXAM: CT CHEST WITHOUT CONTRAST TECHNIQUE: Multidetector CT imaging of the chest was performed following the standard protocol without IV contrast. RADIATION DOSE REDUCTION: This exam was performed according to the departmental dose-optimization program which includes automated exposure control, adjustment of the mA and/or kV according to patient size and/or use of iterative reconstruction technique. COMPARISON:  October 16, 2021. FINDINGS: Cardiovascular: No significant vascular findings. Normal heart size. No pericardial effusion. Mediastinum/Nodes: No enlarged mediastinal or axillary lymph nodes. Thyroid gland, trachea, and esophagus demonstrate no significant findings. Lungs/Pleura: No pneumothorax is noted. Left lung is clear. There is been interval placement of pigtail drainage catheter into the right lung base. There is continued presence of loculated pleural effusion with associated atelectasis or pneumonia as noted on prior exam, consistent with empyema. This is not significantly changed compared to prior exam. Upper Abdomen: No acute abnormality. Musculoskeletal: No chest wall mass or suspicious bone lesions identified. IMPRESSION: Interval placement of pigtail drainage catheter in the right lung base. Grossly stable loculated right pleural effusion is noted with associated right basilar atelectasis or infiltrate, consistent with empyema as noted on prior exam. Electronically Signed   By: October 18, 2021 M.D.   On: 10/25/2021 10:50   DG Chest Port 1 View  Result Date: 10/24/2021 CLINICAL DATA:  Pneumonia. EXAM: PORTABLE CHEST 1 VIEW COMPARISON:  October 23 2021 FINDINGS: The right chest tube is stable. No pneumothorax. The left lung is clear. Opacity remains in the right base with  probable associated small effusion. Stable cardiomediastinal silhouette. IMPRESSION: 1. Stable chest tube. 2. Small right pleural effusion.  Right basilar opacity, stable. 3. No other interval changes. Electronically Signed   By: 12-12-1988 III M.D.   On: 10/24/2021 07:58    Signature:   10/26/2021, MSN, AG-ACNP-BC [facility] 10/25/2021, 2:41 PM  See Amion for pager If no response to pager, please call PCCM consult pager After 7:00 pm call Elink      Patient seen, independently examined, care plan was formulated and discussed with 10/27/2021 as per documentation  Spoke with Dr. Fernande Bras regarding patient's status He agreed to evaluate  for possible VATS decortication -Patient will be transferred to: For intervention Continues to spike a fever  Had fibrinolytics placed into chest tube today -We will continue to monitor  Transferred to [facility]  Virl Diamond, MD [place] P[facility]M Pager: See Loretha Stapler

## 2021-10-26 ENCOUNTER — Inpatient Hospital Stay (HOSPITAL_COMMUNITY): Payer: Self-pay

## 2021-10-26 LAB — PATHOLOGIST SMEAR REVIEW

## 2021-10-26 NOTE — Progress Notes (Signed)
Todd Pena is having a difficult time with still being in the hospital.  He stated that he is "barely holding on" and does not feel like himself.  He feels that he needs to go home soon even if it means coming back to [facility] for the procedure later.  Chaplain provided reflective listening and spiritual support.   [address] [facility] Pager, [phone]

## 2021-10-26 NOTE — Progress Notes (Signed)
NAME:  Todd Pena, MRN:  [id number], DOB:  10-Mar-1985, LOS: 10 ADMISSION DATE:  10/16/2021, CONSULTATION DATE:  10/17/2021 REFERRING MD:  Dr. [place], CHIEF COMPLAINT:  Short of breath  History of Present Illness:  36 yo male presented to ER on 8/16 with Rt sided back pain, fatigue, productive cough and dyspnea.  He was found to have Rt lower lobe infiltrate consistent with community acquired pneumonia, and discharged home with oral antibiotic and plan to follow up with his PCP.  He returned to the ER on 8/19 with progressive dyspnea, hemoptysis, and pleuritic chest pain.  He was admitted to the hospital and started on broad spectrum IV antibiotics.  CT chest showed loculated effusion and PCCM asked to assist with management of this.  Pertinent  Medical History  HTN  Significant Hospital Events: Including procedures, antibiotic start and stop dates in addition to other pertinent events   8/16 ER assessment >> dx with CAP and d/c home with doxycycline 8/19 Admit, start IV ABx 8/20 bedside u/s >> only small fluid pocket 8/21 IR assessment >> only small fluid pocket 8/23 progression of Rt pleural effusion  8/24 IR reconsulted >> 12 F Rt pigtail chest tube placed  8/25 start pleurolytic therapy 8/26 2nd pleurolytic therapy 8/27 off O2 8/28 repeat CT> mostly unchanged, 3rd pleurolytic therapy, still fevering 101.1  Studies:  CT angio chest 8/19 >> moderate loculated Rt pleural effusion, trace Lt pleural effusion, extensive consolidation and ATX especially at Rt lung base Echo 8/23 >> EF 60 to 65%, mod LVH, aortic root 44 mm Rt pleural fluid 8/24 >> glucose 48, protein 5.3, LDH 1328, WBC 637 (93% neutrophils)  Interim History / Subjective:  S/p 3rd dose of intrapleural lytics 8/28> 220 ml out  CXR unchanged 101.1 temp last evening  Patient verbalizes he's contemplating leaving AMA vs staying to do "the right thing".  States he is not in a good place and feels "incarcerated" at times,  missing his family and life at home and frustrated over lack of improvement.  Currently day 10 of hospitalization.    Objective   Blood pressure 134/88, pulse 93, temperature 98.9 F (37.2 C), temperature source Oral, resp. rate 20, height 5\' 11"  (1.803 m), weight 129.1 kg, SpO2 96 %.        Intake/Output Summary (Last 24 hours) at 10/26/2021 1438 Last data filed at 10/26/2021 1244 Gross per 24 hour  Intake 890 ml  Output 220 ml  Net 670 ml   Filed Weights   10/22/21 0500 10/24/21 0500 10/25/21 0500  Weight: 132.1 kg 127 kg 129.1 kg    Examination: General:  Adult male sitting in room, NAD but depressed appearing HEENT: MM pink/moist Neuro: Aoxxx3, MAE CV: rr PULM:  non labored, clear left, clear right, diminished right base, right lateral pigtail to 20 cm sxn, no airleak, minimal serosanguinous to mostly bloody output, flushes well   GI: soft, bs+ Extremities: warm/dry, no LE edema   Assessment & Plan:   Rt complex parapneumonic effusion in setting of Rt lower lobar bacterial community acquired pneumonia. - Abx day 11, currently on cefepime> continues to spike intermittent fevers - Rt pigtail catheter placed by IR on 8/24 - s/p 3rd dose of intrapleural lytics on 8/28 with minimal output.  CXR unchanged.  Awaiting bed at [facility] for TCTS evaluation for VATS.  Has been assigned a bed at [facility], hopeful to get him over this afternoon.  Will remain on [facility] service.   - f/u pleural  fluid culture negative.  Cytology from 8/24> negative    Remainder per primary team.    Labs       Latest Ref Rng & Units 10/25/2021    4:40 AM 10/24/2021   11:03 AM 10/22/2021    5:09 AM  CMP  Glucose 70 - 99 mg/dL 95  250  539   BUN 6 - 20 mg/dL 8  9  9    Creatinine 0.61 - 1.24 mg/dL  7.67  3.41   Sodium 135 - 145 mmol/L 137  137  136   Potassium 3.5 - 5.1 mmol/L 4.7  3.9  3.6   Chloride 98 - 111 mmol/L 103  102  98   CO2 22 - 32 mmol/L 24  26  32   Calcium 8.9 - 10.3 mg/dL 8.5  8.2  8.4    Total Protein 6.5 - 8.1 g/dL   6.9   Total Bilirubin 0.3 - 1.2 mg/dL   1.0   Alkaline Phos 38 - 126 U/L   76   AST 15 - 41 U/L   27   ALT 0 - 44 U/L   24        Latest Ref Rng & Units 10/24/2021   11:03 AM 10/21/2021    5:32 AM 10/20/2021    5:30 AM  CBC  WBC 4.0 - 10.5 K/uL 20.4  21.7  24.3   Hemoglobin 13.0 - 17.0 g/dL 10/22/2021  90.2  40.9   Hematocrit 39.0 - 52.0 % 36.3  43.2  38.7   Platelets 150 - 400 K/uL 927  898  816     DG Chest Port 1 View  Result Date: 10/26/2021 CLINICAL DATA:  Empyema EXAM: PORTABLE CHEST 1 VIEW COMPARISON:  10/25/2021, 10/24/2021 FINDINGS: Right basilar chest tube remains in place. Persistent small-moderate right pleural effusion with associated right basilar opacity. Heart size is normal. Left lung remains clear. No pneumothorax IMPRESSION: Persistent small-moderate right pleural effusion with associated right basilar opacity. Right chest tube remains in place. Electronically Signed   By: 10/26/2021 D.O.   On: 10/26/2021 11:38   CT CHEST WO CONTRAST  Result Date: 10/25/2021 CLINICAL DATA:  Empyema. EXAM: CT CHEST WITHOUT CONTRAST TECHNIQUE: Multidetector CT imaging of the chest was performed following the standard protocol without IV contrast. RADIATION DOSE REDUCTION: This exam was performed according to the departmental dose-optimization program which includes automated exposure control, adjustment of the mA and/or kV according to patient size and/or use of iterative reconstruction technique. COMPARISON:  October 16, 2021. FINDINGS: Cardiovascular: No significant vascular findings. Normal heart size. No pericardial effusion. Mediastinum/Nodes: No enlarged mediastinal or axillary lymph nodes. Thyroid gland, trachea, and esophagus demonstrate no significant findings. Lungs/Pleura: No pneumothorax is noted. Left lung is clear. There is been interval placement of pigtail drainage catheter into the right lung base. There is continued presence of loculated pleural  effusion with associated atelectasis or pneumonia as noted on prior exam, consistent with empyema. This is not significantly changed compared to prior exam. Upper Abdomen: No acute abnormality. Musculoskeletal: No chest wall mass or suspicious bone lesions identified. IMPRESSION: Interval placement of pigtail drainage catheter in the right lung base. Grossly stable loculated right pleural effusion is noted with associated right basilar atelectasis or infiltrate, consistent with empyema as noted on prior exam. Electronically Signed   By: October 18, 2021 M.D.   On: 10/25/2021 10:50    Signature:   10/27/2021, MSN, AG-ACNP-BC [facility] 10/26/2021, 2:38  PM  See Amion for pager If no response to pager, please call PCCM consult pager After 7:00 pm call Elink

## 2021-10-26 NOTE — Progress Notes (Signed)
Referring Physician(s): Dr Lavinia Sharps  Supervising Physician: Roanna Banning  Patient Status:  [facility] - In-pt  Chief Complaint:  Right sided Empyema. R placed a right sided chest tube on 8.24.23 by Dr. Archer Asa  Subjective:  Patient states that he is "tapped" He states that he understands why he needs to be in the hospital physically but mentally he is having a hard time.   Allergies: Patient has no known allergies.  Medications: Prior to Admission medications   Medication Sig Start Date End Date Taking? Authorizing Provider  amLODipine (NORVASC) 5 MG tablet Take 1 tablet (5 mg total) by mouth daily. 12/31/18 01/30/19  Long, Arlyss Repress, MD     Vital Signs: BP 134/88 (BP Location: Right Arm)   Pulse 93   Temp 98.9 F (37.2 C) (Oral)   Resp 20   Ht 5\' 11"  (1.803 m)   Wt 284 lb 9.8 oz (129.1 kg)   SpO2 96%   BMI 39.70 kg/m   Physical Exam Vitals and nursing note reviewed.  Constitutional:      Appearance: He is well-developed.  HENT:     Head: Normocephalic.  Pulmonary:     Effort: Pulmonary effort is normal.     Comments: right sided chest tube stable. 220 ml of amber color fluid overnight. Atrium is to wall suction. No air leak or kinks noted. Dressing is clean dry and intact.  Musculoskeletal:        General: Normal range of motion.     Cervical back: Normal range of motion.  Skin:    General: Skin is dry.  Neurological:     Mental Status: He is alert and oriented to person, place, and time.     Imaging: DG Chest Port 1 View  Result Date: 10/26/2021 CLINICAL DATA:  Empyema EXAM: PORTABLE CHEST 1 VIEW COMPARISON:  10/25/2021, 10/24/2021 FINDINGS: Right basilar chest tube remains in place. Persistent small-moderate right pleural effusion with associated right basilar opacity. Heart size is normal. Left lung remains clear. No pneumothorax IMPRESSION: Persistent small-moderate right pleural effusion with associated right basilar opacity. Right chest tube  remains in place. Electronically Signed   By: 10/26/2021 D.O.   On: 10/26/2021 11:38   CT CHEST WO CONTRAST  Result Date: 10/25/2021 CLINICAL DATA:  Empyema. EXAM: CT CHEST WITHOUT CONTRAST TECHNIQUE: Multidetector CT imaging of the chest was performed following the standard protocol without IV contrast. RADIATION DOSE REDUCTION: This exam was performed according to the departmental dose-optimization program which includes automated exposure control, adjustment of the mA and/or kV according to patient size and/or use of iterative reconstruction technique. COMPARISON:  October 16, 2021. FINDINGS: Cardiovascular: No significant vascular findings. Normal heart size. No pericardial effusion. Mediastinum/Nodes: No enlarged mediastinal or axillary lymph nodes. Thyroid gland, trachea, and esophagus demonstrate no significant findings. Lungs/Pleura: No pneumothorax is noted. Left lung is clear. There is been interval placement of pigtail drainage catheter into the right lung base. There is continued presence of loculated pleural effusion with associated atelectasis or pneumonia as noted on prior exam, consistent with empyema. This is not significantly changed compared to prior exam. Upper Abdomen: No acute abnormality. Musculoskeletal: No chest wall mass or suspicious bone lesions identified. IMPRESSION: Interval placement of pigtail drainage catheter in the right lung base. Grossly stable loculated right pleural effusion is noted with associated right basilar atelectasis or infiltrate, consistent with empyema as noted on prior exam. Electronically Signed   By: October 18, 2021 M.D.   On: 10/25/2021  10:50   DG Chest Port 1 View  Result Date: 10/24/2021 CLINICAL DATA:  Pneumonia. EXAM: PORTABLE CHEST 1 VIEW COMPARISON:  October 23 2021 FINDINGS: The right chest tube is stable. No pneumothorax. The left lung is clear. Opacity remains in the right base with probable associated small effusion. Stable  cardiomediastinal silhouette. IMPRESSION: 1. Stable chest tube. 2. Small right pleural effusion.  Right basilar opacity, stable. 3. No other interval changes. Electronically Signed   By: Gerome Sam III M.D.   On: 10/24/2021 07:58   DG Chest Port 1 View  Result Date: 10/23/2021 CLINICAL DATA:  Pneumonia EXAM: PORTABLE CHEST 1 VIEW COMPARISON:  October 21, 2021 FINDINGS: The right chest tube remains, advanced slightly in the interval. A right-sided pleural effusion remains but is smaller. Persistent opacity in the right mid lower lung. The left lung is clear. The cardiomediastinal silhouette is unchanged. No pneumothorax. IMPRESSION: 1. The right chest tube remains in place, advanced slightly in the interval. 2. Persistent but smaller right pleural effusion. 3. Persistent opacity in the right mid lower lung. 4. No other changes. Electronically Signed   By: Gerome Sam III M.D.   On: 10/23/2021 08:29    Labs:  CBC: Recent Labs    10/19/21 0458 10/20/21 0530 10/21/21 0532 10/24/21 1103  WBC 24.7* 24.3* 21.7* 20.4*  HGB 13.7 12.8* 14.1 11.7*  HCT 40.6 38.7* 43.2 36.3*  PLT 760* 816* 898* 927*    COAGS: Recent Labs    10/16/21 0946  INR 1.2  APTT 40*    BMP: Recent Labs    10/21/21 0532 10/22/21 0509 10/24/21 1103 10/25/21 0440  NA 141 136 137 137  K 3.2* 3.6 3.9 4.7  CL 99 98 102 103  CO2 30 32 26 24  GLUCOSE 108* 114* 130* 95  BUN 9 9 9 8   CALCIUM 8.9 8.4* 8.2* 8.5*  CREATININE 1.12 1.15 0.99 0.94  GFRNONAA >60 >60 >60 >60    LIVER FUNCTION TESTS: Recent Labs    10/16/21 0946 10/19/21 0458 10/22/21 0509  BILITOT 1.3* 1.6* 1.0  AST 17 16 27   ALT 23 19 24   ALKPHOS 57 73 76  PROT 6.4* 7.3 6.9  ALBUMIN 2.8* 3.3* 2.8*    Assessment and Plan:  36 y.o. male inpatient. History of  obesity, hypertension who was admitted to [facility] on 8/19 with persistent cough, chest pain, imaging findings concerning for right lower lobe pneumonia with complex  right pleural effusion.  He has failed to improve despite course of antibiotic therapy. Patient aws found to have a complex loculated  right sided pleural effusion. IR placed a right sided chest tube on 8.24.23. flr lytic instillation. Chest xray from 8.29.23 reads Right basilar chest tube remains in place. Persistent small-moderate right pleural effusion with associated right basilar opacity. CT Chest from 8.28.23 reads Interval placement of pigtail drainage catheter in the right lung base. Grossly stable loculated right pleural effusion is noted with associated right basilar atelectasis or infiltrate, consistent with empyema as noted on prior exam.  Right sided chest tube stable. 220 mls out overnight. Amber output noted to be the atrium. Atrium is to wall suction. No air leak or kinks noted. Dressing is clean dry and intact.  WBC 20.4. Culutres shows no growth in 3 days. Gram stain WBC present predominantly PMN. Per Pulmonlogy at bedside there is discussion of the patient being transferred to [facility] for possible VATS procedure.   IR to continue to follow.  Electronically  Signed: Alene Mires, NP 10/26/2021, 1:48 PM   I spent a total of 15 Minutes at the the patient's bedside AND on the patient's hospital floor or unit, greater than 50% of which was counseling/coordinating care for right sided chest tube.

## 2021-10-26 NOTE — Progress Notes (Signed)
Mobility Specialist Cancellation/Refusal Note:    10/26/21 1019  Mobility  Activity Refused mobility     Reason for Cancellation/Refusal: Pt declined mobility at this time. Pt "working on my mood". Will check back as schedule permits.     [facility]

## 2021-10-26 NOTE — Plan of Care (Signed)
  Problem: Health Behavior/Discharge Planning: Goal: Ability to manage health-related needs will improve Outcome: Progressing   Problem: Clinical Measurements: Goal: Ability to maintain clinical measurements within normal limits will improve Outcome: Progressing Goal: Will remain free from infection Outcome: Progressing Goal: Diagnostic test results will improve Outcome: Progressing Goal: Respiratory complications will improve Outcome: Progressing Goal: Cardiovascular complication will be avoided Outcome: Progressing   Problem: Activity: Goal: Risk for activity intolerance will decrease Outcome: Progressing   Problem: Nutrition: Goal: Adequate nutrition will be maintained Outcome: Progressing   Problem: Coping: Goal: Level of anxiety will decrease Outcome: Progressing   Problem: Elimination: Goal: Will not experience complications related to bowel motility Outcome: Progressing Goal: Will not experience complications related to urinary retention Outcome: Progressing   Problem: Pain Managment: Goal: General experience of comfort will improve Outcome: Progressing   Problem: Safety: Goal: Ability to remain free from injury will improve Outcome: Progressing   Problem: Skin Integrity: Goal: Risk for impaired skin integrity will decrease Outcome: Progressing   Problem: Activity: Goal: Ability to tolerate increased activity will improve Outcome: Progressing   Problem: Clinical Measurements: Goal: Ability to maintain a body temperature in the normal range will improve Outcome: Progressing   Problem: Respiratory: Goal: Ability to maintain adequate ventilation will improve Outcome: Progressing Goal: Ability to maintain a clear airway will improve Outcome: Progressing   

## 2021-10-26 NOTE — Progress Notes (Signed)
Report called and given to 6 Airline pilot at [facility]. Patient stable and ready for transport. [facility] at the bedside and ready to take patient to [facility].

## 2021-10-26 NOTE — Progress Notes (Signed)
PROGRESS NOTE  Todd Pena  DOB: 25-Apr-1985  PCP: Patient, No Pcp Per HGD:[id number]  DOA: 10/16/2021  LOS: 10 days  Hospital Day: 11  Brief narrative: Todd Pena is a 36 y.o. male with PMH significant for morbid obesity, hypertension 8/16, patient presented to the ED with complaint of cough, chest pain.  Chest x-ray showed right lower lobe pneumonia and he was discharged home with a course of doxycycline.  Despite compliance to the antibiotic, patient did not improve and he returned to ED on 8/19.  Also reported vomiting and watery diarrhea.  In the ED, he was afebrile, heart rate and blood pressure elevated, he required 2 L oxygen by nasal cannula WBC count was elevated to 19.9, lactic acid normal COVID-19 PCR negative.  Influenza A/B PCR negative.   Chest x-ray compared to previous x-ray showed bilateral lung opacities, increased on the right and stable on the left  CTPA did not show any pulmonary embolism but showed moderate loculated appearing right pleural effusion and trace left pleural effusion, extensive associated atelectasis or consolidation particularly of the right lung base with more focal opacity in the superior segment right lower lobe consistent with multifocal infection and possible aspiration.  Patient was started on IV fluids, IV antibiotics  Admitted to hospitalist service  See below for details.  Subjective: Patient was seen and examined this morning.  Not in respiratory distress.  Frustrated with prolonged course of hospitalization and the need of transfer to [facility].  Assessment and plan: Sepsis, POA Multifocal pneumonia Large right empyema Presented with pneumonia not responding to doxycycline as an outpatient  CT scan with multifocal pneumonia greatest in right lower lobe with associated loculated pleural effusion.  Patient was started on IV antibiotics.  Blood culture negative.  Strep pneumo antigen negative.   8/23, because of persistent leukocytosis  and low-grade fever, checks x-ray was repeated on 8/23.  It showed large right pleural effusion which is worse than previous x-ray few days ago.   8/25, ultrasound-guided thoracentesis and pigtail chest tube was placed by IR Chest tube in suction.  Patient remains on supplemental oxygen. Currently on IV cefepime.  Clinically improving.  Respiratory status improving.  On room air today.  But patient still having episodic spikes of temperature, at 101.1 last night.  WBC count still remains elevated. 8/28, repeat CT scan today shows grossly stable loculated right pleural effusion with right basilar atelectasis.  PCCM consulted CT surgeon Dr. Cliffton Asters.  Plans to do VATS at [facility].  Currently pending transfer.  Continue Mucinex, incentive spirometry Recent Labs  Lab 10/20/21 0530 10/21/21 0532 10/22/21 0509 10/24/21 1103  WBC 24.3* 21.7*  --  20.4*  PROCALCITON  --  0.71 0.36  --    Acute respiratory failure with hypoxia Initially required high flow oxygen.  Currently on room air at rest.  Also able to ambulate in the hall without supplemental oxygen.   8/23, echocardiogram and ultrasound duplex lower extremities unremarkable  Hemoptysis Related to pneumonia Continue to monitor  Acute on chronic thrombocytosis Baseline bradycardia from 2020 was over 600.  He states he has not been evaluated by hematology.  Platelet liver remains elevated, probably acute phase reactant.   Recent Labs  Lab 10/20/21 0530 10/21/21 0532 10/24/21 1103  PLT 816* 898* 927*     Acute metabolic encephalopathy, POA Patient was confused at the time of admission probably due to pneumonia.   mental status appears now to be back at his typical baseline.   Essential  hypertension, poorly controlled Blood pressure currently remains controlled on Coreg 25 mg twice daily, amlodipine to 10 mg p.o. daily and as needed hydralazine.   Insomnia Melatonin 3 mg p.o. nightly, trazodone 100mg  PO qHS PRN   Morbid obesity   -Body mass index is 39.7 kg/m. Patient has been advised to make an attempt to improve diet and exercise patterns to aid in weight loss.  Goals of care   Code Status: Full Code    Mobility: Encourage ambulation  Skin assessment:     Nutritional status:  Body mass index is 39.7 kg/m.          Diet:  Diet Order             Diet regular Room service appropriate? Yes; Fluid consistency: Thin  Diet effective now                   DVT prophylaxis: Start Lovenox subcu    Antimicrobials: IV cefepime. Fluid: None Consultants: Pulmonology Family Communication: None at bedside  Status is: Inpatient  Continue in-hospital care because: Pigtail chest tube remains in place.  Needs VATS because of persistent empyema level of care: Telemetry Medical   Dispo: The patient is from: Home              Anticipated d/c is to: Home after clearance with pulmonary              Patient currently is not medically stable to d/c.   Difficult to place patient No    Infusions:   ceFEPime (MAXIPIME) IV 2 g (10/26/21 0537)    Scheduled Meds:  alteplase (CATHFLO ACTIVASE) 10 mg in sodium chloride (PF) 0.9 % 30 mL  10 mg Intrapleural Once   And   dornase alfa (PULMOZYME) 5 mg in sterile water (preservative free) 30 mL  5 mg Intrapleural Once   amLODipine  10 mg Oral Daily   carvedilol  25 mg Oral BID WC   enoxaparin (LOVENOX) injection  60 mg Subcutaneous Q24H   guaiFENesin  1,200 mg Oral BID   melatonin  3 mg Oral QHS   sodium chloride flush  10 mL Intrapleural Q8H   sodium chloride flush  10 mL Intrapleural Q8H    PRN meds: acetaminophen, guaiFENesin-dextromethorphan, hydrALAZINE, HYDROcodone bit-homatropine, HYDROmorphone (DILAUDID) injection, ipratropium-albuterol, ondansetron **OR** ondansetron (ZOFRAN) IV, oxyCODONE, senna-docusate, traZODone   Antimicrobials: Anti-infectives (From admission, onward)    Start     Dose/Rate Route Frequency Ordered Stop   10/21/21 1300   ceFEPIme (MAXIPIME) 2 g in sodium chloride 0.9 % 100 mL IVPB        2 g 200 mL/hr over 30 Minutes Intravenous Every 8 hours 10/21/21 1210     10/20/21 1000  cefdinir (OMNICEF) capsule 300 mg  Status:  Discontinued        300 mg Oral Every 12 hours 10/20/21 0835 10/21/21 1140   10/20/21 1000  metroNIDAZOLE (FLAGYL) tablet 500 mg  Status:  Discontinued        500 mg Oral 2 times daily 10/20/21 0835 10/21/21 1140   10/16/21 2100  piperacillin-tazobactam (ZOSYN) IVPB 3.375 g  Status:  Discontinued        3.375 g 12.5 mL/hr over 240 Minutes Intravenous Every 8 hours 10/16/21 1912 10/20/21 0835   10/16/21 1400  piperacillin-tazobactam (ZOSYN) IVPB 3.375 g        3.375 g 12.5 mL/hr over 240 Minutes Intravenous  Once 10/16/21 1347 10/16/21 1735   10/16/21 1045  cefTRIAXone (  ROCEPHIN) 2 g in sodium chloride 0.9 % 100 mL IVPB  Status:  Discontinued        2 g 200 mL/hr over 30 Minutes Intravenous Every 24 hours 10/16/21 1036 10/16/21 1902   10/16/21 1045  azithromycin (ZITHROMAX) 500 mg in sodium chloride 0.9 % 250 mL IVPB  Status:  Discontinued        500 mg 250 mL/hr over 60 Minutes Intravenous Every 24 hours 10/16/21 1036 10/16/21 1902       Objective: Vitals:   10/25/21 2108 10/26/21 0425  BP: 132/83 134/88  Pulse: 95 93  Resp: 20 20  Temp: 99.3 F (37.4 C) 98.9 F (37.2 C)  SpO2: 95% 96%    Intake/Output Summary (Last 24 hours) at 10/26/2021 1426 Last data filed at 10/26/2021 1244 Gross per 24 hour  Intake 890 ml  Output 220 ml  Net 670 ml   Filed Weights   10/22/21 0500 10/24/21 0500 10/25/21 0500  Weight: 132.1 kg 127 kg 129.1 kg   Weight change:  Body mass index is 39.7 kg/m.   Physical Exam: General exam: Pleasant, young African-American male.  On room air. Skin: No rashes, lesions or ulcers. HEENT: Atraumatic, normocephalic, no obvious bleeding Lungs: No crackles or wheezing, diminished air entry in the right.  Pigtail chest tube in place CVS: Regular rate and  rhythm, no murmur GI/Abd soft, nontender, nondistended, bowel sound present CNS: Alert, awake, oriented x3 Psychiatry: Frustrated Extremities: No pedal edema, no calf tenderness  Data Review: I have personally reviewed the laboratory data and studies available.  F/u labs ordered Unresulted Labs (From admission, onward)     Start     Ordered   10/27/21 0500  CBC with Differential/Platelet  Tomorrow morning,   R       Question:  Specimen collection method  Answer:  Lab=Lab collect   10/26/21 0729   10/27/21 0500  Basic metabolic panel  Tomorrow morning,   R       Question:  Specimen collection method  Answer:  Lab=Lab collect   10/26/21 0729   10/21/21 1951  CD19 and CD20, Flow Cytometry  Once,   R        10/21/21 1951   10/21/21 1621  pH, Pleural Fluid (as Misc Test)  Once,   R       Comments: PLEURAL FLUID IS NOT AN ACCEPTED SPECIMEN SOURCE FOR ORDER "PH, BODY FLUID - [id number]"   THIS MISC. LAB ORDER SHOULD BE USED INSTEAD.  TESTING WILL BE PERFORMED AT [facility].  RESULT WILL DISPLAY IN RESULTS REVIEW "MISCELLANEOUS TEST" WITH A SCANNED REPORT LINK   Question:  Test name / description:  Answer:  PH, BODY FLUID / [id number] THROUGH [facility] TO [facility] - ORDER #[id number]   10/21/21 1622   10/21/21 1616  Acid Fast Culture with reflexed sensitivities  (AFB smear + Culture w reflexed sensitivities panel)  Once,   R       See Hyperspace for full Linked Orders Report.   10/21/21 1622            Signed, Lorin Glass, MD [facility] 10/26/2021

## 2021-10-26 NOTE — Plan of Care (Signed)
  Problem: Clinical Measurements: Goal: Ability to maintain clinical measurements within normal limits will improve Outcome: Progressing Goal: Diagnostic test results will improve Outcome: Progressing Goal: Cardiovascular complication will be avoided Outcome: Progressing   Problem: Coping: Goal: Level of anxiety will decrease Outcome: Progressing

## 2021-10-26 NOTE — Progress Notes (Signed)
Mobility Specialist - Progress Note    10/26/21 1511  Mobility  HOB Elevated/Bed Position Self regulated  Activity Ambulated independently in hallway  Range of Motion/Exercises Active  Level of Assistance Independent  Assistive Device None  Distance Ambulated (ft) 250 ft  Activity Response Tolerated well  Transport method Ambulatory  $Mobility charge 1 Mobility   Pt received in bed and agreeable to mobility. C/o wanting to go home. Pt to bed after session with all needs met.    [facility]

## 2021-10-26 NOTE — Plan of Care (Signed)
  Problem: Health Behavior/Discharge Planning: Goal: Ability to manage health-related needs will improve Outcome: Progressing   Problem: Clinical Measurements: Goal: Ability to maintain clinical measurements within normal limits will improve Outcome: Progressing   Problem: Activity: Goal: Risk for activity intolerance will decrease Outcome: Progressing   Problem: Coping: Goal: Level of anxiety will decrease Outcome: Progressing   

## 2021-10-27 ENCOUNTER — Encounter (HOSPITAL_COMMUNITY)
Admission: EM | Disposition: A | Discharge status: Home / Self Care | Payer: Self-pay | Source: Home / Self Care | Attending: Internal Medicine

## 2021-10-27 ENCOUNTER — Inpatient Hospital Stay (HOSPITAL_COMMUNITY): Payer: Self-pay | Admitting: Certified Registered"

## 2021-10-27 ENCOUNTER — Other Ambulatory Visit: Payer: Self-pay

## 2021-10-27 ENCOUNTER — Encounter (HOSPITAL_COMMUNITY): Payer: Self-pay | Admitting: Internal Medicine

## 2021-10-27 DIAGNOSIS — J189 Pneumonia, unspecified organism: Secondary | ICD-10-CM

## 2021-10-27 DIAGNOSIS — I10 Essential (primary) hypertension: Secondary | ICD-10-CM

## 2021-10-27 DIAGNOSIS — J869 Pyothorax without fistula: Secondary | ICD-10-CM

## 2021-10-27 DIAGNOSIS — Z87891 Personal history of nicotine dependence: Secondary | ICD-10-CM

## 2021-10-27 HISTORY — PX: VIDEO ASSISTED THORACOSCOPY (VATS)/DECORTICATION: SHX6171

## 2021-10-27 LAB — PROCALCITONIN: Procalcitonin: <0.1 ng/mL

## 2021-10-27 LAB — BRAIN NATRIURETIC PEPTIDE: B Natriuretic Peptide: 42.4 pg/mL (ref 0.0–100.0)

## 2021-10-27 LAB — COMPREHENSIVE METABOLIC PANEL
ALT: 33 U/L (ref 0–44)
AST: 31 U/L (ref 15–41)
Albumin: 2.4 g/dL — ABNORMAL LOW (ref 3.5–5.0)
Alkaline Phosphatase: 46 U/L (ref 38–126)
Anion gap: 13 (ref 5–15)
BUN: <5 mg/dL — ABNORMAL LOW (ref 6–20)
CO2: 24 mmol/L (ref 22–32)
Calcium: 8.8 mg/dL — ABNORMAL LOW (ref 8.9–10.3)
Chloride: 102 mmol/L (ref 98–111)
Creatinine, Ser: 1.09 mg/dL (ref 0.61–1.24)
GFR, Estimated: >60 mL/min (ref >60)
Glucose, Bld: 102 mg/dL — ABNORMAL HIGH (ref 70–99)
Potassium: 4.1 mmol/L (ref 3.5–5.1)
Sodium: 139 mmol/L (ref 135–145)
Total Bilirubin: 0.7 mg/dL (ref 0.3–1.2)
Total Protein: 6.4 g/dL — ABNORMAL LOW (ref 6.5–8.1)

## 2021-10-27 LAB — CD19 AND CD20, FLOW CYTOMETRY
% CD19: 12.9 % (ref 4.6–22.1)
% CD20: 12.9 % (ref 5.0–22.3)

## 2021-10-27 LAB — TYPE AND SCREEN
ABO/RH(D): B POS
Antibody Screen: NEGATIVE

## 2021-10-27 LAB — CBC
HCT: 31.8 % — ABNORMAL LOW (ref 39.0–52.0)
Hemoglobin: 10.4 g/dL — ABNORMAL LOW (ref 13.0–17.0)
MCH: 28.8 pg (ref 26.0–34.0)
MCHC: 32.7 g/dL (ref 30.0–36.0)
MCV: 88.1 fL (ref 80.0–100.0)
Platelets: 998 10*3/uL (ref 150–400)
RBC: 3.61 MIL/uL — ABNORMAL LOW (ref 4.22–5.81)
RDW: 13.2 % (ref 11.5–15.5)
WBC: 15.2 10*3/uL — ABNORMAL HIGH (ref 4.0–10.5)
nRBC: 0 % (ref 0.0–0.2)

## 2021-10-27 LAB — ABO/RH: ABO/RH(D): B POS

## 2021-10-27 LAB — PROTIME-INR
INR: 1.1 (ref 0.8–1.2)
Prothrombin Time: 14.4 seconds (ref 11.4–15.2)

## 2021-10-27 LAB — MAGNESIUM: Magnesium: 2.1 mg/dL (ref 1.7–2.4)

## 2021-10-27 LAB — C-REACTIVE PROTEIN: CRP: 3.8 mg/dL — ABNORMAL HIGH (ref <1.0)

## 2021-10-27 SURGERY — VIDEO ASSISTED THORACOSCOPY (VATS)/DECORTICATION
Anesthesia: General | Site: Chest | Laterality: Right

## 2021-10-27 MED ORDER — GLYCOPYRROLATE 0.2 MG/ML IJ SOLN
INTRAMUSCULAR | Status: AC
  Filled 2021-10-27: qty 1

## 2021-10-27 MED ORDER — DEXAMETHASONE SODIUM PHOSPHATE 10 MG/ML IJ SOLN
INTRAMUSCULAR | Status: DC | PRN
  Administered 2021-10-27: 10 mg via INTRAVENOUS

## 2021-10-27 MED ORDER — MIDAZOLAM HCL 2 MG/2ML IJ SOLN
INTRAMUSCULAR | Status: AC
  Filled 2021-10-27: qty 2

## 2021-10-27 MED ORDER — PROPOFOL 10 MG/ML IV BOLUS
INTRAVENOUS | Status: AC
  Filled 2021-10-27: qty 20

## 2021-10-27 MED ORDER — LACTATED RINGERS IV SOLN: INTRAVENOUS | Status: DC | PRN

## 2021-10-27 MED ORDER — ONDANSETRON HCL 4 MG/2ML IJ SOLN
INTRAMUSCULAR | Status: DC | PRN
  Administered 2021-10-27: 4 mg via INTRAVENOUS

## 2021-10-27 MED ORDER — SODIUM CHLORIDE 0.9 % IV SOLN: INTRAVENOUS | Status: DC | PRN

## 2021-10-27 MED ORDER — FENTANYL CITRATE (PF) 250 MCG/5ML IJ SOLN
INTRAMUSCULAR | Status: DC | PRN
Start: 2021-10-27 — End: 2021-10-27
  Administered 2021-10-27: 50 ug via INTRAVENOUS
  Administered 2021-10-27: 100 ug via INTRAVENOUS

## 2021-10-27 MED ORDER — LACTATED RINGERS IV SOLN: INTRAVENOUS | Status: DC

## 2021-10-27 MED ORDER — ACETAMINOPHEN 500 MG PO TABS: 1000.0000 mg | ORAL_TABLET | Freq: Once | ORAL | Status: DC

## 2021-10-27 MED ORDER — LIDOCAINE 2% (20 MG/ML) 5 ML SYRINGE
INTRAMUSCULAR | Status: DC | PRN
  Administered 2021-10-27: 80 mg via INTRAVENOUS

## 2021-10-27 MED ORDER — MEPERIDINE HCL 25 MG/ML IJ SOLN: 6.2500 mg | INTRAMUSCULAR | Status: DC | PRN

## 2021-10-27 MED ORDER — GLYCOPYRROLATE 0.2 MG/ML IJ SOLN
INTRAMUSCULAR | Status: DC | PRN
  Administered 2021-10-27: .2 mg via INTRAVENOUS

## 2021-10-27 MED ORDER — CHLORHEXIDINE GLUCONATE 0.12 % MT SOLN: 15.0000 mL | Freq: Once | OROMUCOSAL | Status: AC

## 2021-10-27 MED ORDER — HYDROMORPHONE HCL 1 MG/ML IJ SOLN
INTRAMUSCULAR | Status: AC
  Filled 2021-10-27: qty 1

## 2021-10-27 MED ORDER — PHENYLEPHRINE 80 MCG/ML (10ML) SYRINGE FOR IV PUSH (FOR BLOOD PRESSURE SUPPORT)
PREFILLED_SYRINGE | INTRAVENOUS | Status: DC | PRN
  Administered 2021-10-27: 80 ug via INTRAVENOUS
  Administered 2021-10-27 (×2): 160 ug via INTRAVENOUS

## 2021-10-27 MED ORDER — PROMETHAZINE HCL 25 MG/ML IJ SOLN: 6.2500 mg | INTRAMUSCULAR | Status: DC | PRN

## 2021-10-27 MED ORDER — SUGAMMADEX SODIUM 200 MG/2ML IV SOLN
INTRAVENOUS | Status: DC | PRN
  Administered 2021-10-27: 500 mg via INTRAVENOUS

## 2021-10-27 MED ORDER — HYDROMORPHONE HCL 1 MG/ML IJ SOLN
0.2500 mg | INTRAMUSCULAR | Status: DC | PRN
  Administered 2021-10-27 (×4): 0.5 mg via INTRAVENOUS

## 2021-10-27 MED ORDER — MIDAZOLAM HCL 2 MG/2ML IJ SOLN
INTRAMUSCULAR | Status: DC | PRN
  Administered 2021-10-27 (×2): 2 mg via INTRAVENOUS

## 2021-10-27 MED ORDER — BUPIVACAINE HCL (PF) 0.5 % IJ SOLN
INTRAMUSCULAR | Status: AC
  Filled 2021-10-27: qty 30

## 2021-10-27 MED ORDER — ROCURONIUM BROMIDE 10 MG/ML (PF) SYRINGE
PREFILLED_SYRINGE | INTRAVENOUS | Status: DC | PRN
  Administered 2021-10-27: 70 mg via INTRAVENOUS
  Administered 2021-10-27: 30 mg via INTRAVENOUS

## 2021-10-27 MED ORDER — PROPOFOL 10 MG/ML IV BOLUS
INTRAVENOUS | Status: DC | PRN
  Administered 2021-10-27: 200 mg via INTRAVENOUS

## 2021-10-27 MED ORDER — 0.9 % SODIUM CHLORIDE (POUR BTL) OPTIME
TOPICAL | Status: DC | PRN
  Administered 2021-10-27: 2000 mL

## 2021-10-27 MED ORDER — ORAL CARE MOUTH RINSE: 15.0000 mL | Freq: Once | OROMUCOSAL | Status: AC

## 2021-10-27 MED ORDER — BUPIVACAINE HCL 0.5 % IJ SOLN
INTRAMUSCULAR | Status: DC | PRN
  Administered 2021-10-27: 30 mL

## 2021-10-27 MED ORDER — FENTANYL CITRATE (PF) 250 MCG/5ML IJ SOLN
INTRAMUSCULAR | Status: AC
  Filled 2021-10-27: qty 5

## 2021-10-27 MED ORDER — CHLORHEXIDINE GLUCONATE 0.12 % MT SOLN
OROMUCOSAL | Status: AC
  Administered 2021-10-27: 15 mL via OROMUCOSAL
  Filled 2021-10-27: qty 15

## 2021-10-27 SURGICAL SUPPLY — 88 items
ADH SKN CLS APL DERMABOND .7 (GAUZE/BANDAGES/DRESSINGS) ×1
APL PRP STRL LF DISP 70% ISPRP (MISCELLANEOUS) ×1
APL SRG 22X2 LUM MLBL SLNT (VASCULAR PRODUCTS)
APL SRG 7X2 LUM MLBL SLNT (VASCULAR PRODUCTS)
APPLICATOR TIP COSEAL (VASCULAR PRODUCTS) IMPLANT
APPLICATOR TIP EXT COSEAL (VASCULAR PRODUCTS) IMPLANT
BLADE CLIPPER SURG (BLADE) IMPLANT
BLADE SURG 11 STRL SS (BLADE) ×2 IMPLANT
CANISTER SUCT 3000ML PPV (MISCELLANEOUS) ×2 IMPLANT
CATH THORACIC 28FR (CATHETERS) IMPLANT
CATH THORACIC 36FR (CATHETERS) IMPLANT
CATH THORACIC 36FR RT ANG (CATHETERS) IMPLANT
CHLORAPREP W/TINT 26 (MISCELLANEOUS) ×2 IMPLANT
CLEANER TIP ELECTROSURG 2X2 (MISCELLANEOUS) IMPLANT
CNTNR URN SCR LID CUP LEK RST (MISCELLANEOUS) ×4 IMPLANT
CONN ST 1/4X3/8  BEN (MISCELLANEOUS)
CONN ST 1/4X3/8 BEN (MISCELLANEOUS) IMPLANT
CONN Y 3/8X3/8X3/8  BEN (MISCELLANEOUS)
CONN Y 3/8X3/8X3/8 BEN (MISCELLANEOUS) IMPLANT
CONT SPEC 4OZ STRL OR WHT (MISCELLANEOUS) ×2
COVER SURGICAL LIGHT HANDLE (MISCELLANEOUS) IMPLANT
DEFOGGER SCOPE WARMER CLEARIFY (MISCELLANEOUS) ×2 IMPLANT
DERMABOND ADVANCED (GAUZE/BANDAGES/DRESSINGS) ×1
DERMABOND ADVANCED .7 DNX12 (GAUZE/BANDAGES/DRESSINGS) ×2 IMPLANT
DISSECTOR BLUNT TIP ENDO 5MM (MISCELLANEOUS) IMPLANT
DRAIN CHANNEL 28F RND 3/8 FF (WOUND CARE) IMPLANT
ELECT BLADE 4.0 EZ CLEAN MEGAD (MISCELLANEOUS) ×1
ELECT REM PT RETURN 9FT ADLT (ELECTROSURGICAL) ×1
ELECTRODE BLDE 4.0 EZ CLN MEGD (MISCELLANEOUS) ×2 IMPLANT
ELECTRODE REM PT RTRN 9FT ADLT (ELECTROSURGICAL) ×2 IMPLANT
GAUZE 4X4 16PLY ~~LOC~~+RFID DBL (SPONGE) ×2 IMPLANT
GAUZE SPONGE 4X4 12PLY STRL (GAUZE/BANDAGES/DRESSINGS) ×2 IMPLANT
GLOVE BIO SURGEON STRL SZ7.5 (GLOVE) ×4 IMPLANT
GOWN STRL REUS W/ TWL LRG LVL3 (GOWN DISPOSABLE) ×6 IMPLANT
GOWN STRL REUS W/ TWL XL LVL3 (GOWN DISPOSABLE) ×2 IMPLANT
GOWN STRL REUS W/TWL LRG LVL3 (GOWN DISPOSABLE) ×3
GOWN STRL REUS W/TWL XL LVL3 (GOWN DISPOSABLE) ×1
KIT BASIN OR (CUSTOM PROCEDURE TRAY) ×2 IMPLANT
KIT SUCTION CATH 14FR (SUCTIONS) ×2 IMPLANT
KIT TURNOVER KIT B (KITS) ×2 IMPLANT
NDL 18GX1X1/2 (RX/OR ONLY) (NEEDLE) ×2 IMPLANT
NEEDLE 18GX1X1/2 (RX/OR ONLY) (NEEDLE) ×1 IMPLANT
NS IRRIG 1000ML POUR BTL (IV SOLUTION) ×4 IMPLANT
PACK CHEST (CUSTOM PROCEDURE TRAY) ×2 IMPLANT
PACK UNIVERSAL I (CUSTOM PROCEDURE TRAY) ×2 IMPLANT
PAD ARMBOARD 7.5X6 YLW CONV (MISCELLANEOUS) ×4 IMPLANT
PASSER SUT SWANSON 36MM LOOP (INSTRUMENTS) IMPLANT
SCISSORS LAP 5X35 DISP (ENDOMECHANICALS) IMPLANT
SEALANT SURG COSEAL 4ML (VASCULAR PRODUCTS) IMPLANT
SEALANT SURG COSEAL 8ML (VASCULAR PRODUCTS) IMPLANT
SPONGE T-LAP 18X18 ~~LOC~~+RFID (SPONGE) ×8 IMPLANT
STOPCOCK 4 WAY LG BORE MALE ST (IV SETS) IMPLANT
SUT PROLENE 3 0 SH DA (SUTURE) IMPLANT
SUT PROLENE 4 0 RB 1 (SUTURE)
SUT PROLENE 4-0 RB1 .5 CRCL 36 (SUTURE) IMPLANT
SUT SILK  1 MH (SUTURE) ×1
SUT SILK 1 MH (SUTURE) ×2 IMPLANT
SUT SILK 1 TIES 10X30 (SUTURE) IMPLANT
SUT SILK 2 0SH CR/8 30 (SUTURE) IMPLANT
SUT SILK 3 0SH CR/8 30 (SUTURE) IMPLANT
SUT VIC AB 1 CTX 18 (SUTURE) IMPLANT
SUT VIC AB 1 CTX 36 (SUTURE)
SUT VIC AB 1 CTX36XBRD ANBCTR (SUTURE) IMPLANT
SUT VIC AB 2-0 CT1 27 (SUTURE) ×1
SUT VIC AB 2-0 CT1 TAPERPNT 27 (SUTURE) ×2 IMPLANT
SUT VIC AB 2-0 CTX 36 (SUTURE) IMPLANT
SUT VIC AB 3-0 SH 27 (SUTURE) ×2
SUT VIC AB 3-0 SH 27X BRD (SUTURE) ×2 IMPLANT
SUT VIC AB 3-0 X1 27 (SUTURE) IMPLANT
SUT VICRYL 0 UR6 27IN ABS (SUTURE) ×2 IMPLANT
SUT VICRYL 2 TP 1 (SUTURE) IMPLANT
SWAB COLLECTION DEVICE MRSA (MISCELLANEOUS) IMPLANT
SWAB CULTURE ESWAB REG 1ML (MISCELLANEOUS) IMPLANT
SYR 10ML LL (SYRINGE) IMPLANT
SYR 20ML LL LF (SYRINGE) ×2 IMPLANT
SYR 50ML LL SCALE MARK (SYRINGE) IMPLANT
SYSTEM SAHARA CHEST DRAIN ATS (WOUND CARE) ×2 IMPLANT
TAPE CLOTH 4X10 WHT NS (GAUZE/BANDAGES/DRESSINGS) ×2 IMPLANT
TAPE UMBILICAL COTTON 1/8X30 (MISCELLANEOUS) ×2 IMPLANT
TIP APPLICATOR SPRAY EXTEND 16 (VASCULAR PRODUCTS) IMPLANT
TOWEL GREEN STERILE (TOWEL DISPOSABLE) ×4 IMPLANT
TRAP SPECIMEN MUCUS 40CC (MISCELLANEOUS) IMPLANT
TRAY FOLEY SLVR 16FR LF STAT (SET/KITS/TRAYS/PACK) ×2 IMPLANT
TROCAR XCEL 12X100 BLDLESS (ENDOMECHANICALS) IMPLANT
TROCAR XCEL BLADELESS 5X75MML (TROCAR) IMPLANT
TUBING EXTENTION W/L.L. (IV SETS) IMPLANT
TUBING LAP HI FLOW INSUFFLATIO (TUBING) IMPLANT
WATER STERILE IRR 1000ML POUR (IV SOLUTION) ×2 IMPLANT

## 2021-10-27 NOTE — Consult Note (Signed)
[address]       [place] [address]             [phone]                    EBEN CHOINSKI [facility] Medical Record #[id number] Date of Birth: 11/19/1985  Referring: No ref. provider found Primary Care: Patient, No Pcp Per Primary Cardiologist: None  Chief Complaint:    Chief Complaint  Patient presents with   Shortness of Breath    History of Present Illness:    Abubakr Barbette Merino 36 y.o. male originally presented to [facility] on 10/13/2021 with consistent with pneumonia.  He was discharged on a course of doxycycline but represented 3 days later with worsening symptoms of dyspnea, fevers and chills.  Imaging was consistent with a parapneumonic effusion.  He subsequently underwent chest tube placement with lytic therapy after several courses but continues to spike fevers and has a leukocytosis.  CTS was consulted to assist with management.       Zubrod Score: At the time of surgery this patient's most appropriate activity status/level should be described as: [x]     0    Normal activity, no symptoms []     1    Restricted in physical strenuous activity but ambulatory, able to do out light work []     2    Ambulatory and capable of self care, unable to do work activities, up and about               >50 % of waking hours                              []     3    Only limited self care, in bed greater than 50% of waking hours []     4    Completely disabled, no self care, confined to bed or chair []     5    Moribund   Past Medical History:  Diagnosis Date   Hypertension     Past Surgical History:  Procedure Laterality Date   FRACTURE SURGERY      History reviewed. No pertinent family history.   Social History   Tobacco Use  Smoking Status Former   Types: Cigarettes  Smokeless Tobacco Never    Social History   Substance and Sexual Activity  Alcohol Use No     No Known Allergies  Current Facility-Administered Medications  Medication  Dose Route Frequency Provider Last Rate Last Admin   acetaminophen (TYLENOL) tablet 650 mg  650 mg Oral Q6H PRN , MD   650 mg at 10/25/21 1431   alteplase (CATHFLO ACTIVASE) 10 mg in sodium chloride (PF) 0.9 % 30 mL  10 mg Intrapleural Once Olalere, Adewale A, MD       And   dornase alfa (PULMOZYME) 5 mg in sterile water (preservative free) 30 mL  5 mg Intrapleural Once Olalere, Adewale A, MD       amLODipine (NORVASC) tablet 10 mg  10 mg Oral Daily , Eric J, DO   10 mg at 10/26/21   carvedilol (COREG) tablet 25 mg  25 mg Oral BID WC , Eric J, DO   25 mg at 10/26/21 1638   ceFEPIme (MAXIPIME) 2 g in sodium chloride 0.9 % 100 mL IVPB  2 g Intravenous Q8H 10/27/21, RPH 200  mL/hr at 10/27/21 0528 2 g at 10/27/21 0528   enoxaparin (LOVENOX) injection 60 mg  60 mg Subcutaneous Q24H Allred, Darrell K, PA-C   60 mg at 10/26/21 1529   guaiFENesin (MUCINEX) 12 hr tablet 1,200 mg  1,200 mg Oral BID Dahal, Melina Schools, MD   1,200 mg at 10/26/21 2150   guaiFENesin-dextromethorphan (ROBITUSSIN DM) 100-10 MG/5ML syrup 5 mL  5 mL Oral Q4H PRN Dahal, Melina Schools, MD   5 mL at 10/26/21 2150   hydrALAZINE (APRESOLINE) tablet 25 mg  25 mg Oral Q8H PRN [place], Eric J, DO   25 mg at 10/19/21 0537   HYDROcodone bit-homatropine (HYCODAN) 5-1.5 MG/5ML syrup 5 mL  5 mL Oral Q6H PRN [place], Eric J, DO   5 mL at 10/26/21 1949   HYDROmorphone (DILAUDID) injection 1 mg  1 mg Intravenous Q3H PRN [place], Alvira Philips, DO   1 mg at 10/26/21 2150   ipratropium-albuterol (DUONEB) 0.5-2.5 (3) MG/3ML nebulizer solution 3 mL  3 mL Nebulization Q6H PRN [place], Alvira Philips, DO   3 mL at 10/20/21 1610   melatonin tablet 3 mg  3 mg Oral QHS [place], Alvira Philips, DO   3 mg at 10/26/21 2150   ondansetron (ZOFRAN) tablet 4 mg  4 mg Oral Q6H PRN Charlsie Quest, MD       Or   ondansetron (ZOFRAN) injection 4 mg  4 mg Intravenous Q6H PRN Charlsie Quest, MD       oxyCODONE (Oxy IR/ROXICODONE) immediate release tablet 5 mg  5  mg Oral Q4H PRN [place], Alvira Philips, DO   5 mg at 10/26/21 9604   senna-docusate (Senokot-S) tablet 1 tablet  1 tablet Oral QHS PRN Charlsie Quest, MD       sodium chloride flush (NS) 0.9 % injection 10 mL  10 mL Intrapleural Q8H Olalere, Adewale A, MD   10 mL at 10/27/21 0529   sodium chloride flush (NS) 0.9 % injection 10 mL  10 mL Intrapleural Q8H Selmer Dominion B, NP   10 mL at 10/27/21 0305   traZODone (DESYREL) tablet 100 mg  100 mg Oral QHS PRN [place], Eric J, DO   100 mg at 10/26/21 2150    Review of Systems  Constitutional:  Positive for chills, fever and malaise/fatigue.  Respiratory:  Positive for cough and shortness of breath.      PHYSICAL EXAMINATION: BP 125/85 (BP Location: Left Arm)   Pulse 86   Temp 98.4 F (36.9 C) (Oral)   Resp 20   Ht  (1.803 m)   Wt 123.7 kg   SpO2 94%   BMI 38.03 kg/m  Physical Exam Constitutional:      General: He is not in acute distress.    Appearance: He is well-developed and normal weight. He is not ill-appearing.  HENT:     Head: Normocephalic and atraumatic.  Eyes:     Extraocular Movements: Extraocular movements intact.     Pupils: Pupils are equal, round, and reactive to light.  Cardiovascular:     Rate and Rhythm: Normal rate and regular rhythm.  Pulmonary:     Effort: No tachypnea.  Skin:    General: Skin is warm and dry.  Neurological:     General: No focal deficit present.     Mental Status: He is alert and oriented to person, place, and time.     Diagnostic Studies & Laboratory data:     Recent Radiology Findings:   [facility] Chest [facility]  1 View  Result Date: 10/26/2021 CLINICAL DATA:  Empyema EXAM: PORTABLE CHEST 1 VIEW COMPARISON:  10/25/2021, 10/24/2021 FINDINGS: Right basilar chest tube remains in place. Persistent small-moderate right pleural effusion with associated right basilar opacity. Heart size is normal. Left lung remains clear. No pneumothorax IMPRESSION: Persistent small-moderate right pleural effusion  with associated right basilar opacity. Right chest tube remains in place. Electronically Signed   By: Duanne Guess D.O.   On: 10/26/2021 11:38   CT CHEST WO CONTRAST  Result Date: 10/25/2021 CLINICAL DATA:  Empyema. EXAM: CT CHEST WITHOUT CONTRAST TECHNIQUE: Multidetector CT imaging of the chest was performed following the standard protocol without IV contrast. RADIATION DOSE REDUCTION: This exam was performed according to the departmental dose-optimization program which includes automated exposure control, adjustment of the mA and/or kV according to patient size and/or use of iterative reconstruction technique. COMPARISON:  October 16, 2021. FINDINGS: Cardiovascular: No significant vascular findings. Normal heart size. No pericardial effusion. Mediastinum/Nodes: No enlarged mediastinal or axillary lymph nodes. Thyroid gland, trachea, and esophagus demonstrate no significant findings. Lungs/Pleura: No pneumothorax is noted. Left lung is clear. There is been interval placement of pigtail drainage catheter into the right lung base. There is continued presence of loculated pleural effusion with associated atelectasis or pneumonia as noted on prior exam, consistent with empyema. This is not significantly changed compared to prior exam. Upper Abdomen: No acute abnormality. Musculoskeletal: No chest wall mass or suspicious bone lesions identified. IMPRESSION: Interval placement of pigtail drainage catheter in the right lung base. Grossly stable loculated right pleural effusion is noted with associated right basilar atelectasis or infiltrate, consistent with empyema as noted on prior exam. Electronically Signed   By: Lupita Raider M.D.   On: 10/25/2021 10:50   CT [facility] PLEURAL DRAIN W/INDWELL CATH W/IMG GUIDE  Result Date: 10/25/2021 INDICATION: 36 year old male with loculated pleural effusion concerning for empyema. EXAM: CT-guided chest tube placement TECHNIQUE: Multidetector CT imaging of the chest was  performed following the standard protocol without IV contrast. RADIATION DOSE REDUCTION: This exam was performed according to the departmental dose-optimization program which includes automated exposure control, adjustment of the mA and/or kV according to patient size and/or use of iterative reconstruction technique. MEDICATIONS: The patient is currently admitted to the hospital and receiving intravenous antibiotics. The antibiotics were administered within an appropriate time frame prior to the initiation of the procedure. ANESTHESIA/SEDATION: Moderate (conscious) sedation was employed during this procedure. A total of Versed 2 mg and Fentanyl 150 mcg was administered intravenously by the radiology nurse. Total intra-service moderate Sedation Time: 20 minutes. The patient's level of consciousness and vital signs were monitored continuously by radiology nursing throughout the procedure under my direct supervision. COMPLICATIONS: None immediate. PROCEDURE: Informed written consent was obtained from the patient after a thorough discussion of the procedural risks, benefits and alternatives. All questions were addressed. Maximal Sterile Barrier Technique was utilized including caps, mask, sterile gowns, sterile gloves, sterile drape, hand hygiene and skin antiseptic. A timeout was performed prior to the initiation of the procedure. Axial CT imaging demonstrates a highly loculated right-sided pleural effusion. A suitable skin entry site was identified at the level of the nipple in the mid axillary line. The skin was marked and then prepped and draped in the standard fashion using chlorhexidine skin prep. Local anesthesia was attained by infiltration with 1% lidocaine. Under intermittent CT guidance, an 18 gauge trocar needle was advanced over the rib and into the pleural space. A 0.035 wire was then advanced  into the pleural space. The percutaneous tract was dilated to 12 [place]. A Cook 12 [place] all-purpose drainage  catheter modified with additional sideholes was advanced over the wire and into the pleural space. The drainage catheter was connected to an atrium and low wall suction. The catheter was secured to the skin with 0 Prolene suture. Follow-up CT imaging demonstrates a well-positioned drainage catheter. Initial drainage through the tube was turbid straw-colored fluid. IMPRESSION: Successful placement of 12 French right-sided percutaneous chest tube using CT guidance. Electronically Signed   By: Malachy Moan M.D.   On: 10/25/2021 09:36   DG Chest Port 1 View  Result Date: 10/24/2021 CLINICAL DATA:  Pneumonia. EXAM: PORTABLE CHEST 1 VIEW COMPARISON:  October 23 2021 FINDINGS: The right chest tube is stable. No pneumothorax. The left lung is clear. Opacity remains in the right base with probable associated small effusion. Stable cardiomediastinal silhouette. IMPRESSION: 1. Stable chest tube. 2. Small right pleural effusion.  Right basilar opacity, stable. 3. No other interval changes. Electronically Signed   By: Gerome Sam III M.D.   On: 10/24/2021 07:58   DG Chest Port 1 View  Result Date: 10/23/2021 CLINICAL DATA:  Pneumonia EXAM: PORTABLE CHEST 1 VIEW COMPARISON:  October 21, 2021 FINDINGS: The right chest tube remains, advanced slightly in the interval. A right-sided pleural effusion remains but is smaller. Persistent opacity in the right mid lower lung. The left lung is clear. The cardiomediastinal silhouette is unchanged. No pneumothorax. IMPRESSION: 1. The right chest tube remains in place, advanced slightly in the interval. 2. Persistent but smaller right pleural effusion. 3. Persistent opacity in the right mid lower lung. 4. No other changes. Electronically Signed   By: Gerome Sam III M.D.   On: 10/23/2021 08:29   DG CHEST PORT 1 VIEW  Result Date: 10/21/2021 CLINICAL DATA:  Right pleural effusion EXAM: PORTABLE CHEST 1 VIEW COMPARISON:  10/20/2021 FINDINGS: Right basilar pigtail chest  tube has been placed. Large right pleural effusion persists, essentially unchanged from prior examination, with collapse of the right middle and lower lobes and compressive atelectasis of the right upper lobe. Mild left basilar atelectasis or infiltrate persists. No pneumothorax. Cardiac size within normal limits. Pulmonary vascularity is normal. IMPRESSION: Interval placement of right basilar pigtail chest tube. Persistent large right pleural effusion with collapse of the right middle and lower lobes and compressive atelectasis of the right upper lobe. Electronically Signed   By: Helyn Numbers M.D.   On: 10/21/2021 20:11   VAS [place] LOWER EXTREMITY VENOUS (DVT)  Result Date: 10/20/2021  Lower Venous DVT Study Patient Name:  JAYDEN KRATOCHVIL [facility]  Date of Exam:   10/20/2021 Medical Rec #: [id number]        Accession #:    [id number] Date of Birth: May 14, 1985        Patient Gender: M Patient Age:   80 years Exam Location:  [facility] Procedure:      VAS [place] LOWER EXTREMITY VENOUS (DVT) Referring Phys: Lorin Glass --------------------------------------------------------------------------------  Indications: Swelling.  Risk Factors: None identified. Limitations: Poor ultrasound/tissue interface. Comparison Study: No prior studies. Performing Technologist: Chanda Busing RVT  Examination Guidelines: A complete evaluation includes B-mode imaging, spectral Doppler, color Doppler, and power Doppler as needed of all accessible portions of each vessel. Bilateral testing is considered an integral part of a complete examination. Limited examinations for reoccurring indications may be performed as noted. The reflux portion of the exam is performed with the patient in reverse Trendelenburg.  +---------+---------------+---------+-----------+----------+--------------+  RIGHT    CompressibilityPhasicitySpontaneityPropertiesThrombus Aging +---------+---------------+---------+-----------+----------+--------------+ CFV       Full           Yes      Yes                                 +---------+---------------+---------+-----------+----------+--------------+ SFJ      Full                                                        +---------+---------------+---------+-----------+----------+--------------+ FV Prox  Full                                                        +---------+---------------+---------+-----------+----------+--------------+ FV Mid   Full                                                        +---------+---------------+---------+-----------+----------+--------------+ FV DistalFull                                                        +---------+---------------+---------+-----------+----------+--------------+ PFV      Full                                                        +---------+---------------+---------+-----------+----------+--------------+ POP      Full           Yes      Yes                                 +---------+---------------+---------+-----------+----------+--------------+ PTV      Full                                                        +---------+---------------+---------+-----------+----------+--------------+ PERO     Full                                                        +---------+---------------+---------+-----------+----------+--------------+   +---------+---------------+---------+-----------+----------+--------------+ LEFT     CompressibilityPhasicitySpontaneityPropertiesThrombus Aging +---------+---------------+---------+-----------+----------+--------------+ CFV      Full           Yes      Yes                                 +---------+---------------+---------+-----------+----------+--------------+  SFJ      Full                                                        +---------+---------------+---------+-----------+----------+--------------+ FV Prox  Full                                                         +---------+---------------+---------+-----------+----------+--------------+ FV Mid   Full                                                        +---------+---------------+---------+-----------+----------+--------------+ FV Distal               Yes      Yes                                 +---------+---------------+---------+-----------+----------+--------------+ PFV      Full                                                        +---------+---------------+---------+-----------+----------+--------------+ POP      Full           Yes      Yes                                 +---------+---------------+---------+-----------+----------+--------------+ PTV      Full                                                        +---------+---------------+---------+-----------+----------+--------------+ PERO     Full                                                        +---------+---------------+---------+-----------+----------+--------------+     Summary: RIGHT: - There is no evidence of deep vein thrombosis in the lower extremity. However, portions of this examination were limited- see technologist comments above.  - No cystic structure found in the popliteal fossa.  LEFT: - There is no evidence of deep vein thrombosis in the lower extremity. However, portions of this examination were limited- see technologist comments above.  - No cystic structure found in the popliteal fossa.  *See table(s) above for measurements and observations. Electronically signed by Coral Else MD on 10/20/2021 at 8:27:50 PM.    Final    ECHOCARDIOGRAM COMPLETE  Result Date: 10/20/2021    ECHOCARDIOGRAM REPORT  Patient Name:   CROCKETT RALLO Date of Exam: 10/20/2021 Medical Rec #:  [id number]       Height:       71.0 in Accession #:    [id number]      Weight:       291.0 lb Date of Birth:  05/24/1985       BSA:          2.473 m Patient Age:    35 years        BP:           138/98 mmHg  Patient Gender: M               HR:           104 bpm. Exam Location:  Inpatient Procedure: 2D Echo, Cardiac Doppler and Color Doppler Indications:    Abnormal ECG  History:        Patient has no prior history of Echocardiogram examinations.                 Risk Factors:Hypertension.  Sonographer:    Gaynell Face Referring Phys: [id number] BINAYA DAHAL IMPRESSIONS  1. Left ventricular ejection fraction, by estimation, is 60 to 65%. The left ventricle has normal function. The left ventricle has no regional wall motion abnormalities. There is moderate left ventricular hypertrophy. Left ventricular diastolic parameters were normal.  2. Right ventricular systolic function is normal. The right ventricular size is normal.  3. The mitral valve is normal in structure. Trivial mitral valve regurgitation.  4. The aortic valve is tricuspid. Aortic valve regurgitation is not visualized.  5. Aortic dilatation noted. There is moderate dilatation of the aortic root, measuring 44 mm.  6. The inferior vena cava is dilated in size with >50% respiratory variability, suggesting right atrial pressure of 8 mmHg. FINDINGS  Left Ventricle: Left ventricular ejection fraction, by estimation, is 60 to 65%. The left ventricle has normal function. The left ventricle has no regional wall motion abnormalities. The left ventricular internal cavity size was normal in size. There is  moderate left ventricular hypertrophy. Left ventricular diastolic parameters were normal. Right Ventricle: The right ventricular size is normal. Right vetricular wall thickness was not assessed. Right ventricular systolic function is normal. Left Atrium: Left atrial size was normal in size. Right Atrium: Right atrial size was normal in size. Pericardium: There is no evidence of pericardial effusion. Mitral Valve: The mitral valve is normal in structure. Trivial mitral valve regurgitation. Tricuspid Valve: The tricuspid valve is normal in structure. Tricuspid valve  regurgitation is trivial. Aortic Valve: The aortic valve is tricuspid. Aortic valve regurgitation is not visualized. Aortic valve mean gradient measures 2.0 mmHg. Aortic valve peak gradient measures 4.5 mmHg. Aortic valve area, by VTI measures 4.56 cm. Pulmonic Valve: The pulmonic valve was normal in structure. Pulmonic valve regurgitation is not visualized. Aorta: Aortic dilatation noted. There is moderate dilatation of the aortic root, measuring 44 mm. Venous: The inferior vena cava is dilated in size with greater than 50% respiratory variability, suggesting right atrial pressure of 8 mmHg. IAS/Shunts: No atrial level shunt detected by color flow Doppler.  LEFT VENTRICLE PLAX 2D LVIDd:         5.00 cm   Diastology LVIDs:         3.20 cm   LV e' medial:    9.36 cm/s LV PW:         1.30 cm   LV E/e' medial:  7.3 LV IVS:  1.60 cm   LV e' lateral:   13.50 cm/s LVOT diam:     2.60 cm   LV E/e' lateral: 5.1 LV SV:         71 LV SV Index:   29 LVOT Area:     5.31 cm  RIGHT VENTRICLE RV S prime:     14.30 cm/s TAPSE (M-mode): 2.0 cm LEFT ATRIUM             Index        RIGHT ATRIUM           Index LA Vol (A2C):   53.0 ml 21.43 ml/m  RA Area:     14.30 cm LA Vol (A4C):   61.1 ml 24.71 ml/m  RA Volume:   38.40 ml  15.53 ml/m LA Biplane Vol: 57.1 ml 23.09 ml/m  AORTIC VALVE AV Area (Vmax):    4.22 cm AV Area (Vmean):   4.20 cm AV Area (VTI):     4.56 cm AV Vmax:           106.00 cm/s AV Vmean:          71.900 cm/s AV VTI:            0.155 m AV Peak Grad:      4.5 mmHg AV Mean Grad:      2.0 mmHg LVOT Vmax:         84.20 cm/s LVOT Vmean:        56.900 cm/s LVOT VTI:          0.133 m LVOT/AV VTI ratio: 0.86  AORTA Ao Root diam: 4.40 cm Ao Asc diam:  3.40 cm MITRAL VALVE MV Area (PHT): 4.86 cm    SHUNTS MV Decel Time: 156 msec    Systemic VTI:  0.13 m MV E velocity: 68.40 cm/s  Systemic Diam: 2.60 cm MV A velocity: 48.10 cm/s MV E/A ratio:  1.42 Dietrich Pates MD Electronically signed by Dietrich Pates MD Signature  Date/Time: 10/20/2021/3:57:18 PM    Final    DG Chest 2 View  Result Date: 10/20/2021 CLINICAL DATA:  Pneumonia EXAM: CHEST - 2 VIEW COMPARISON:  10/16/2021 FINDINGS: Heart size within normal limits. Large right pleural effusion, increased from prior. Progressive bibasilar airspace opacities. No pneumothorax. IMPRESSION: 1. Large right pleural effusion, increased from prior. 2. Progressive bibasilar airspace opacities. Electronically Signed   By: Duanne Guess D.O.   On: 10/20/2021 15:21   [place] CHEST (PLEURAL EFFUSION)  Result Date: 10/18/2021 CLINICAL DATA:  36 year old male with right-sided pleural effusion EXAM: CHEST ULTRASOUND COMPARISON:  None Available. FINDINGS: Highly complicated/loculated but small right-sided pleural effusion. The due to the internal septations, the collapse portion of the right lower lobe is held close to the pleural surface. There is not sufficient space for safe thoracentesis. IMPRESSION: Small a highly complex and loculated right pleural effusion. There is no safe window for thoracentesis. Electronically Signed   By: Malachy Moan M.D.   On: 10/18/2021 13:17   CT Angio Chest PE W and/or Wo Contrast  Result Date: 10/16/2021 CLINICAL DATA:  Pulmonary embolism suspected, recent diagnosis of pneumonia EXAM: CT ANGIOGRAPHY CHEST WITH CONTRAST TECHNIQUE: Multidetector CT imaging of the chest was performed using the standard protocol during bolus administration of intravenous contrast. Multiplanar CT image reconstructions and MIPs were obtained to evaluate the vascular anatomy. RADIATION DOSE REDUCTION: This exam was performed according to the departmental dose-optimization program which includes automated exposure control, adjustment of the mA and/or kV according  to patient size and/or use of iterative reconstruction technique. CONTRAST:  100mL OMNIPAQUE IOHEXOL 350 MG/ML SOLN COMPARISON:  None Available. FINDINGS: Cardiovascular: Examination for pulmonary embolism is  somewhat limited by marginal contrast bolus and breath motion artifact, particularly in the lung bases. Within this limitation, no evidence of pulmonary embolism through the proximal segmental pulmonary arterial level. Normal heart size. No pericardial effusion. Mediastinum/Nodes: No enlarged mediastinal, hilar, or axillary lymph nodes. Thyroid gland, trachea, and esophagus demonstrate no significant findings. Lungs/Pleura: Moderate, loculated appearing right pleural effusion and trace left pleural effusion. Extensive associated atelectasis or consolidation, particularly of the right lung base, with a somewhat more focal opacity of the superior segment right lower lobe (series 5, image 42). Upper Abdomen: No acute abnormality. Musculoskeletal: No chest wall abnormality. No acute osseous findings. Review of the MIP images confirms the above findings. IMPRESSION: 1. Examination for pulmonary embolism is somewhat limited by marginal contrast bolus and breath motion artifact, particularly in the lung bases. Within this limitation, no evidence of pulmonary embolism through the proximal segmental pulmonary arterial level. 2. Moderate, loculated appearing right pleural effusion and trace left pleural effusion. Extensive associated atelectasis or consolidation, particularly of the right lung base, with a somewhat more focal opacity of the superior segment right lower lobe. Findings are consistent with multifocal infection and/or aspiration. Electronically Signed   By: Jearld LeschAlex D Bibbey M.D.   On: 10/16/2021 13:12   DG Chest 2 View  Result Date: 10/16/2021 CLINICAL DATA:  Progressing pneumonia symptoms. EXAM: CHEST - 2 VIEW COMPARISON:  10/13/2021 and older studies. FINDINGS: Cardiac silhouette is normal in size. No mediastinal or hilar masses or evidence of adenopathy. Lung volumes are low. There is bilateral lung base opacity mostly obscuring the left and obscuring the right hemidiaphragms, similar on the left but  increased on the right compared to the prior exam. Mid to upper lungs remain clear. No convincing pleural effusion.  No pneumothorax. Skeletal structures are intact. IMPRESSION: 1. Bilateral lung base opacities, increased on the right and stable on the left compared to the most recent prior chest radiograph, consistent with pneumonia, atelectasis or a combination. Findings are accentuated by low lung volumes. Electronically Signed   By: Amie Portlandavid  Ormond M.D.   On: 10/16/2021 09:35   DG Chest 2 View  Result Date: 10/13/2021 CLINICAL DATA:  Cough. EXAM: CHEST - 2 VIEW COMPARISON:  Chest x-ray 03/06/2018 FINDINGS: There is patchy airspace disease in the left lower lobe. There is some minimal atelectasis in the right mid lung. Lung volumes are low. No pleural effusion or pneumothorax. Cardiomediastinal silhouette within normal limits. No acute fractures. IMPRESSION: Left lower lobe airspace disease worrisome for pneumonia. Follow-up chest x-ray recommended in 4-6 weeks to confirm resolution. Low lung volumes. Electronically Signed   By: Darliss CheneyAmy  Guttmann M.D.   On: 10/13/2021 21:07       I have independently reviewed the above radiology studies  and reviewed the findings with the patient.   Recent Lab Findings: Lab Results  Component Value Date   WBC 15.2 (H) 10/27/2021   HGB 10.4 (L) 10/27/2021   HCT 31.8 (L) 10/27/2021   PLT 998 (HH) 10/27/2021   GLUCOSE 95 10/25/2021   ALT 24 10/22/2021   AST 27 10/22/2021   NA 137 10/25/2021   K 4.7 10/25/2021   CL 103 10/25/2021   CREATININE 0.94 10/25/2021   BUN 8 10/25/2021   CO2 24 10/25/2021   TSH 1.239 03/06/2018   INR 1.1 10/27/2021  Assessment / Plan:   36 year old male with a loculated parapneumonic effusion on the right side.  He is undergone lytic therapy without complete resolution of this.  He continues to have fevers and has a leukocytosis.  We will plan for a right thoracoscopy, decortication.      Corliss Skains 10/27/2021 7:58  AM

## 2021-10-27 NOTE — Progress Notes (Signed)
PROGRESS NOTE                                                                                                                                                                                                             Patient Demographics:    Todd Pena, is a 36 y.o. male, DOB - 28-Jun-1985, FTD:[id number]  Outpatient Primary MD for the patient is Patient, No Pcp Per    LOS - 11  Admit date - 10/16/2021    Chief Complaint  Patient presents with   Shortness of Breath       Brief Narrative (HPI from H&P)   36 y.o. male with PMH significant for morbid obesity, hypertension 8/16, patient presented to the ED with complaint of cough, chest pain.  Chest x-ray showed right lower lobe pneumonia and he was discharged home with a course of doxycycline.  Despite compliance to the antibiotic, patient did not improve and he returned to [facility] long ED on 10/16/21 with shortness of breath and cough, was diagnosed with right-sided empyema along with acute hypoxic respiratory failure, he was seen by pulmonary critical care, he underwent ultrasound-guided thoracentesis followed by Victanyl chest tube catheter placed by IR however continued to have signs of ongoing infection and empyema.  His case was discussed by pulmonary critical care physician at [facility] with cardiothoracic surgeon Dr. Cliffton Pena for possible VATS procedure and he was transferred to [facility] under my care on 10/27/2021 for further care.   8/16 ER assessment >> dx with CAP and d/c home with doxycycline 8/19 Admit, start IV ABX. CT angio chest 8/19 >> moderate loculated Rt pleural effusion, trace Lt pleural effusion, extensive consolidation and ATX especially at Rt lung base 8/20 bedside u/s >> only small fluid pocket 8/21 IR assessment >> only small fluid pocket 8/23 progression of Rt pleural effusion  Echocardiogram.  EF 60%.  No acute changes 8/24 IR  reconsulted >> 12 F Rt pigtail chest tube placed Rt pleural fluid 8/24 >> glucose 48, protein 5.3, LDH 1328, WBC 637 (93% neutrophils) - exudative  8/25 start pleurolytic therapy 8/26 2nd pleurolytic therapy 8/27 off O2 8/28 repeat CT> mostly unchanged, 3rd pleurolytic therapy, still fevering 101.1   Subjective:    Todd Pena today has, No headache, No chest pain, No abdominal pain - No Nausea, No new weakness tingling  or numbness, no SOB.   Assessment  & Plan :    Sepsis present on admission due to recurrent pneumonia and right-sided empyema. He has been seen by pulmonary critical care at [facility] underwent ultrasound-guided thoracentesis followed by chest tube placement on 10/21/2021 however empyema persists, he is responding well to IV antibiotics and his sepsis pathophysiology has resolved.  He has been seen by cardiothoracic surgery on 10/26/2021 and awaits VATS procedure later on 10/27/2021.  Continue supportive care.  Currently minimal to no oxygen requirement.   Hemoptysis - Related to pneumonia, now stable.  Continue to monitor.   Acute on chronic thrombocytosis - Baseline lately count several years ago 600 now elevated due to empyema, he has seen hematology in the past post discharge continue to follow.    Acute metabolic encephalopathy, POA - Patient was confused at the time of admission due to sepsis now resolved.   Essential hypertension, poorly controlled - Blood pressure currently remains controlled on Coreg 25 mg twice daily, amlodipine to 10 mg p.o. daily and as needed hydralazine.   Insomnia - Melatonin 3 mg p.o. nightly, trazodone 100mg  PO qHS PRN   Morbid obesity  -Body mass index is 39.7 kg/m.  With PCP postdischarge for weight loss.       Condition - Fair  Family Communication  :  None present  Code Status :  Full  Consults  :  PCCM, CTVS  PUD Prophylaxis :     Procedures  :     CT -  Interval placement of pigtail drainage catheter in the  right lung base. Grossly stable loculated right pleural effusion is noted with associated right basilar atelectasis or infiltrate, consistent with empyema as noted on prior exam.       Disposition Plan  :    Status is: Inpatient  DVT Prophylaxis  :  Lovenox    Lab Results  Component Value Date   PLT 998 (HH) 10/27/2021    Diet :  Diet Order             Diet NPO time specified Except for: Sips with Meds  Diet effective now                    Inpatient Medications  Scheduled Meds:  alteplase (CATHFLO ACTIVASE) 10 mg in sodium chloride (PF) 0.9 % 30 mL  10 mg Intrapleural Once   And   dornase alfa (PULMOZYME) 5 mg in sterile water (preservative free) 30 mL  5 mg Intrapleural Once   amLODipine  10 mg Oral Daily   carvedilol  25 mg Oral BID WC   enoxaparin (LOVENOX) injection  60 mg Subcutaneous Q24H   guaiFENesin  1,200 mg Oral BID   melatonin  3 mg Oral QHS   sodium chloride flush  10 mL Intrapleural Q8H   sodium chloride flush  10 mL Intrapleural Q8H   Continuous Infusions:  ceFEPime (MAXIPIME) IV 2 g (10/27/21 0528)   PRN Meds:.acetaminophen, guaiFENesin-dextromethorphan, hydrALAZINE, HYDROcodone bit-homatropine, HYDROmorphone (DILAUDID) injection, ipratropium-albuterol, ondansetron **OR** ondansetron (ZOFRAN) IV, oxyCODONE, senna-docusate, traZODone  Time Spent in minutes  30   10/29/21 M.D on 10/27/2021 at 9:10 AM  To page go to [email or web address]   [facility] -  Office  [phone]  See all Orders from today for further details    Objective:   Vitals:   10/26/21 2045 10/27/21 0310 10/27/21 0349 10/27/21 0854  BP: 118/78  125/85 118/83  Pulse: 87  86 79  Resp:      Temp: 99 F (37.2 C)  98.4 F (36.9 C)   TempSrc: Oral  Oral   SpO2: 97%  94%   Weight:  123.7 kg    Height:        Wt Readings from Last 3 Encounters:  10/27/21 123.7 kg  10/13/21 127 kg  04/06/21 123.8 kg     Intake/Output Summary (Last 24 hours) at 10/27/2021  0910 Last data filed at 10/27/2021 0400 Gross per 24 hour  Intake 20 ml  Output 58 ml  Net -38 ml     Physical Exam  Awake Alert, No new F.N deficits, Normal affect [place].AT,PERRAL Supple Neck, No JVD,   Symmetrical Chest wall movement, Good air movement bilaterally, R. Chest tube RRR,No Gallops,Rubs or new Murmurs,  +ve B.Sounds, Abd Soft, No tenderness,   No Cyanosis, Clubbing or edema        Data Review:    CBC Recent Labs  Lab 10/21/21 0532 10/24/21 1103 10/27/21 0659  WBC 21.7* 20.4* 15.2*  HGB 14.1 11.7* 10.4*  HCT 43.2 36.3* 31.8*  PLT 898* 927* 998*  MCV 88.0 88.8 88.1  MCH 28.7 28.6 28.8  MCHC 32.6 32.2 32.7  RDW 12.9 13.1 13.2  LYMPHSABS 1.8  --   --   MONOABS 2.4*  --   --   EOSABS 0.3  --   --   BASOSABS 0.2*  --   --     Electrolytes Recent Labs  Lab 10/21/21 0532 10/22/21 0509 10/24/21 1103 10/25/21 0440 10/27/21 0659  NA 141 136 137 137 139  K 3.2* 3.6 3.9 4.7 4.1  CL 99 98 102 103 102  CO2 30 32 26 24 24   GLUCOSE 108* 114* 130* 95 102*  BUN 9 9 9 8  <5*  CREATININE 1.12 1.15 0.99 0.94 1.09  CALCIUM 8.9 8.4* 8.2* 8.5* 8.8*  AST  --  27  --   --  31  ALT  --  24  --   --  33  ALKPHOS  --  76  --   --  46  BILITOT  --  1.0  --   --  0.7  ALBUMIN  --  2.8*  --   --  2.4*  MG  --   --   --   --  2.1  CRP  --   --   --   --  3.8*  PROCALCITON 0.71 0.36  --   --  <0.10  INR  --   --   --   --  1.1  BNP  --   --   --   --  42.4    ID Labs Recent Labs  Lab 10/21/21 0532 10/22/21 0509 10/24/21 1103 10/25/21 0440 10/27/21 0659  WBC 21.7*  --  20.4*  --  15.2*  PLT 898*  --  927*  --  998*  CRP  --   --   --   --  3.8*  PROCALCITON 0.71 0.36  --   --  <0.10  CREATININE 1.12 1.15 0.99 0.94 1.09    Radiology Reports DG Chest Port 1 View  Result Date: 10/26/2021 CLINICAL DATA:  Empyema EXAM: PORTABLE CHEST 1 VIEW COMPARISON:  10/25/2021, 10/24/2021 FINDINGS: Right basilar chest tube remains in place. Persistent small-moderate right  pleural effusion with associated right basilar opacity. Heart size is normal. Left lung remains clear. No pneumothorax IMPRESSION: Persistent small-moderate right pleural effusion with associated right basilar opacity.  Right chest tube remains in place. Electronically Signed   By: Duanne Guess D.O.   On: 10/26/2021 11:38   CT CHEST WO CONTRAST  Result Date: 10/25/2021 CLINICAL DATA:  Empyema. EXAM: CT CHEST WITHOUT CONTRAST TECHNIQUE: Multidetector CT imaging of the chest was performed following the standard protocol without IV contrast. RADIATION DOSE REDUCTION: This exam was performed according to the departmental dose-optimization program which includes automated exposure control, adjustment of the mA and/or kV according to patient size and/or use of iterative reconstruction technique. COMPARISON:  October 16, 2021. FINDINGS: Cardiovascular: No significant vascular findings. Normal heart size. No pericardial effusion. Mediastinum/Nodes: No enlarged mediastinal or axillary lymph nodes. Thyroid gland, trachea, and esophagus demonstrate no significant findings. Lungs/Pleura: No pneumothorax is noted. Left lung is clear. There is been interval placement of pigtail drainage catheter into the right lung base. There is continued presence of loculated pleural effusion with associated atelectasis or pneumonia as noted on prior exam, consistent with empyema. This is not significantly changed compared to prior exam. Upper Abdomen: No acute abnormality. Musculoskeletal: No chest wall mass or suspicious bone lesions identified. IMPRESSION: Interval placement of pigtail drainage catheter in the right lung base. Grossly stable loculated right pleural effusion is noted with associated right basilar atelectasis or infiltrate, consistent with empyema as noted on prior exam. Electronically Signed   By: Lupita Raider M.D.   On: 10/25/2021 10:50   DG Chest Port 1 View  Result Date: 10/24/2021 CLINICAL DATA:  Pneumonia.  EXAM: PORTABLE CHEST 1 VIEW COMPARISON:  October 23 2021 FINDINGS: The right chest tube is stable. No pneumothorax. The left lung is clear. Opacity remains in the right base with probable associated small effusion. Stable cardiomediastinal silhouette. IMPRESSION: 1. Stable chest tube. 2. Small right pleural effusion.  Right basilar opacity, stable. 3. No other interval changes. Electronically Signed   By: Gerome Sam III M.D.   On: 10/24/2021 07:58

## 2021-10-27 NOTE — Anesthesia Preprocedure Evaluation (Signed)
Anesthesia Evaluation  Patient identified by MRN, date of birth, ID band Patient awake    Reviewed: Allergy & Precautions, NPO status , Patient's Chart, lab work & pertinent test results  Airway Mallampati: III  TM Distance: >3 FB Neck ROM: Full    Dental  (+) Dental Advisory Given, Poor Dentition, Chipped, Partial Upper   Pulmonary pneumonia, former smoker,    Pulmonary exam normal breath sounds clear to auscultation       Cardiovascular hypertension,  Rhythm:Regular Rate:Normal     Neuro/Psych negative neurological ROS     GI/Hepatic negative GI ROS, Neg liver ROS,   Endo/Other  negative endocrine ROS  Renal/GU negative Renal ROS     Musculoskeletal negative musculoskeletal ROS (+)   Abdominal (+) + obese,   Peds  Hematology negative hematology ROS (+)   Anesthesia Other Findings   Reproductive/Obstetrics                            Anesthesia Physical Anesthesia Plan  ASA: 3  Anesthesia Plan: General   Post-op Pain Management: Tylenol PO (pre-op)* and Celebrex PO (pre-op)*   Induction: Intravenous  PONV Risk Score and Plan: 3 and Ondansetron, Dexamethasone, Midazolam and Treatment may vary due to age or medical condition  Airway Management Planned: Double Lumen EBT  Additional Equipment:   Intra-op Plan:   Post-operative Plan: Extubation in OR  Informed Consent: I have reviewed the patients History and Physical, chart, labs and discussed the procedure including the risks, benefits and alternatives for the proposed anesthesia with the patient or authorized representative who has indicated his/her understanding and acceptance.     Dental advisory given  Plan Discussed with: CRNA  Anesthesia Plan Comments:        Anesthesia Quick Evaluation

## 2021-10-27 NOTE — Transfer of Care (Signed)
Immediate Anesthesia Transfer of Care Note  Patient: Todd Pena  Procedure(s) Performed: VIDEO ASSISTED THORACOSCOPY (VATS)/DECORTICATION (Right: Chest)  Patient Location: PACU  Anesthesia Type:General  Level of Consciousness: awake and alert   Airway & Oxygen Therapy: Patient Spontanous Breathing and Patient connected to nasal cannula oxygen  Post-op Assessment: Report given to RN, Post -op Vital signs reviewed and stable and Patient moving all extremities X 4  Post vital signs: Reviewed and stable  Last Vitals:  Vitals Value Taken Time  BP 126/88 10/27/21 1610  Temp    Pulse 102 10/27/21 1614  Resp 19 10/27/21 1614  SpO2 90 % 10/27/21 1614  Vitals shown include unvalidated device data.  Last Pain:  Vitals:   10/27/21 1235  TempSrc: Oral  PainSc: 0-No pain      Patients Stated Pain Goal: 3 (10/25/21 2214)  Complications: No notable events documented.

## 2021-10-27 NOTE — Op Note (Signed)
     [address]       [place] [address]             [phone]       10/27/2021 Patient:  Todd Pena Pre-Op Dx: Empyema Post-op Dx: Same Procedure: -Right video assisted thoracoscopy - Decortication - Intercostal nerve block  Surgeon and Role:      * Corliss Skains, MD - Primary    *B. Stehler, PA-C- assisting An experienced assistant was required given the complexity of this surgery and the standard of surgical care. The assistant was needed for exposure, dissection, suctioning, retraction of delicate tissues and sutures, instrument exchange and for overall help during this procedure.     Anesthesia  general EBL: 100 ml Blood Administration: None Specimen: Pleural rind  Drains: 28 F argyle chest tube in right chest Counts: correct    Indications: 36 year old male with a loculated parapneumonic effusion on the right side.  He is undergone lytic therapy without complete resolution of this.  He continues to have fevers and has a leukocytosis.  We will plan for a right thoracoscopy, decortication.  Findings: The lower lobe and middle lobes were densely adherent to the chest wall.  There was a posterior pocket of fluid that was accessed and cleared.  The lung was completely mobilized off of the chest wall, and the middle and lower lobes were decorticated.  Operative Technique: After the risks, benefits and alternatives were thoroughly discussed, the patient was brought to the operative theatre.  Anesthesia was induced, the patient was then placed in a left lateral decubitus position and was prepped and draped in normal sterile fashion.  An appropriate surgical pause was performed, and pre-operative antibiotics were dosed accordingly.  We began with 2cm incision in the anterior axillary line at the 5th intercostal space.  The chest was entered, and we then placed a 1cm incision at the 10th intercostal space, and introduced our camera port.  The lung  was directly visualized.   The lung was then mobilized off of the chest wall.  The pleural peal was carefully decorticated off of each lobe.  The fissure was then mobilized.  We achieved good expansion of the lung.  The chest was then irrigated.    An intercostal nerve block was performed under direct visualization.  A 28 F chest tube was then placed, and we watch the lung re-expand.  The skin and soft tissue were closed with absorbable suture    The patient tolerated the procedure without any immediate complications, and was transferred to the PACU in stable condition.  Emberlin Verner Keane Scrape

## 2021-10-27 NOTE — Brief Op Note (Signed)
10/27/2021  5:01 PM  PATIENT:  Todd Pena  36 y.o. male  PRE-OPERATIVE DIAGNOSIS:  empyema  POST-OPERATIVE DIAGNOSIS:  empyema  PROCEDURE:  Procedure(s): VIDEO ASSISTED THORACOSCOPY (VATS)/DECORTICATION (Right)  SURGEON:  Surgeon(s) and Role:  Lightfoot, Eliezer Lofts, MD - Primary  PHYSICIAN ASSISTANT: Aloha Gell, PA-C  ASSISTANTS: none   ANESTHESIA:   local and general  EBL:  20 mL   BLOOD ADMINISTERED:none  DRAINS:  right pleural chest tube    LOCAL MEDICATIONS USED:  Exparel  SPECIMEN:  Source of Specimen:  Pleural peel   DISPOSITION OF SPECIMEN:   culture  COUNTS CORRECT:  YES  DICTATION: .Dragon Dictation  PLAN OF CARE: Admit to inpatient   PATIENT DISPOSITION:  PACU - hemodynamically stable.   Delay start of Pharmacological VTE agent (>24hrs) due to surgical blood loss or risk of bleeding: no

## 2021-10-27 NOTE — Progress Notes (Addendum)
Patient arrived from pacu to 4e19, vital signs obtained. Patient with chest tube to wall suction -20 no air leak noted at this time. Surgical site dressing clean dry and intact at this time. Vital signs obtained and patient placed on monitor. CCMD made aware. Patient call bell with in reach. Bedside RN updated Jancarlos Thrun, Randall An rN

## 2021-10-27 NOTE — Evaluation (Signed)
Physical Therapy Evaluation Patient Details Name: Todd Pena MRN: [id number] DOB: 1985/11/12 Today's Date: 10/27/2021  History of Present Illness  Patient is a 36 year old male with loculated parapneumonic effusion on the right side s/p lytic therapy without complete resolution. Chest tube catheter placed by IR however continued to have signs of ongoing infection and empyema. Transferred from [facility] long hospital for possible VATS procedure.   Clinical Impression  Patient sitting up in the chair on arrival to room. He is frustrated with his prolonged hospital stay and hopeful to be discharged home soon. The patient is independent with activity, including ambulation in the room. Did not ambulate in hallway due to chest tube to wall suction, however patient ambulated in hallway yesterday without difficulty per notes. Patient was instructed in LE exercises to maintain strength while in the hospital. There are no PT needs identified at this time. Please re-consult PT if there are mobility issues after anticipated procedure.      Recommendations for follow up therapy are one component of a multi-disciplinary discharge planning process, led by the attending physician.  Recommendations may be updated based on patient status, additional functional criteria and insurance authorization.  Follow Up Recommendations No PT follow up      Assistance Recommended at Discharge PRN  Patient can return home with the following  Assist for transportation    Equipment Recommendations None recommended by PT  Recommendations for Other Services       Functional Status Assessment Patient has not had a recent decline in their functional status     Precautions / Restrictions Precautions Precautions: Other (comment) Precaution Comments: R chest tube to suction Restrictions Weight Bearing Restrictions: No      Mobility  Bed Mobility               General bed mobility comments:  (not observed,  patient getting up independently per his report)    Transfers Overall transfer level: Independent                      Ambulation/Gait         Gait velocity: patient able to ambulate a few feet independently in room, no assistive device. limited by chest tube to wall suction (unable to come off suction per MD at this time). noted that patient walked 286ft independently in hallway yesterday with mobility staff        Stairs            Wheelchair Mobility    Modified Rankin (Stroke Patients Only)       Balance Overall balance assessment: Independent                                           Pertinent Vitals/Pain Pain Assessment Pain Assessment: No/denies pain    Home Living Family/patient expects to be discharged to:: Private residence Living Arrangements: Alone Available Help at Discharge: Friend(s);Available PRN/intermittently Type of Home: House Home Access: Level entry       Home Layout: Two level;Able to live on main level with bedroom/bathroom Home Equipment: None      Prior Function Prior Level of Function : Independent/Modified Independent                     Hand Dominance        Extremity/Trunk Assessment   Upper Extremity Assessment  Upper Extremity Assessment: Overall WFL for tasks assessed    Lower Extremity Assessment Lower Extremity Assessment: Overall WFL for tasks assessed       Communication   Communication: No difficulties  Cognition Arousal/Alertness: Awake/alert Behavior During Therapy: WFL for tasks assessed/performed Overall Cognitive Status: Within Functional Limits for tasks assessed                                 General Comments: patient able to follow commands without difficulty        General Comments      Exercises Other Exercises Other Exercises: encouraged  leg exercises (LAQ, seated marching) to maintain lower extremity strength while in the hospital.  patient verablized understanding   Assessment/Plan    PT Assessment Patient does not need any further PT services  PT Problem List         PT Treatment Interventions      PT Goals (Current goals can be found in the Care Plan section)  Acute Rehab PT Goals Patient Stated Goal: to go home as soon as possible PT Goal Formulation: All assessment and education complete, DC therapy    Frequency       Co-evaluation               AM-PAC PT "6 Clicks" Mobility  Outcome Measure Help needed turning from your back to your side while in a flat bed without using bedrails?: None Help needed moving from lying on your back to sitting on the side of a flat bed without using bedrails?: None Help needed moving to and from a bed to a chair (including a wheelchair)?: None Help needed standing up from a chair using your arms (e.g., wheelchair or bedside chair)?: None Help needed to walk in hospital room?: None Help needed climbing 3-5 steps with a railing? : None 6 Click Score: 24    End of Session   Activity Tolerance: Patient tolerated treatment well;No increased pain Patient left: in chair;with call bell/phone within reach   PT Visit Diagnosis: Muscle weakness (generalized) (M62.81)    Time: 1040-1057 PT Time Calculation (min) (ACUTE ONLY): 17 min   Charges:   PT Evaluation $PT Eval Low Complexity: 1 Low          Donna Bernard, PT, MPT   Ina Homes 10/27/2021, 12:34 PM

## 2021-10-27 NOTE — Progress Notes (Signed)
[facility] PCCM  36 y/o male with empyema, loculated effusion which did not improve with intra-pleural lytic therapy x3.  Undergoing VATS today by Dr. Cliffton Asters.   Care from [facility] and [facility] greatly appreciated. PCCM available PRN, please don't hesitate to call if questions.  Heber Ione, MD [place] [facility] Pager: [phone] Cell: [phone] After 7:00 pm call Elink  [phone]

## 2021-10-27 NOTE — Anesthesia Procedure Notes (Addendum)
Procedure Name: Intubation Date/Time: 10/27/2021 2:25 PM  Performed by: Anastasio Auerbach, CRNAPre-anesthesia Checklist: Patient identified, Emergency Drugs available, Suction available and Patient being monitored Patient Re-evaluated:Patient Re-evaluated prior to induction Oxygen Delivery Method: Circle system utilized Preoxygenation: Pre-oxygenation with 100% oxygen Induction Type: IV induction Ventilation: Mask ventilation without difficulty Laryngoscope Size: Mac and 4 Grade View: Grade I Tube type: Oral Endobronchial tube: Left, EBT position confirmed by fiberoptic bronchoscope and Double lumen EBT and 39 Fr Number of attempts: 1 Airway Equipment and Method: Stylet Placement Confirmation: ETT inserted through vocal cords under direct vision, positive ETCO2 and breath sounds checked- equal and bilateral Secured at: 38 cm Tube secured with: Tape Dental Injury: Teeth and Oropharynx as per pre-operative assessment  Comments: Inserted by Laurence Spates

## 2021-10-28 ENCOUNTER — Inpatient Hospital Stay (HOSPITAL_COMMUNITY): Payer: Self-pay

## 2021-10-28 ENCOUNTER — Inpatient Hospital Stay: Payer: Self-pay | Admitting: Adult Health

## 2021-10-28 ENCOUNTER — Encounter (HOSPITAL_COMMUNITY): Payer: Self-pay | Admitting: Thoracic Surgery (Cardiothoracic Vascular Surgery)

## 2021-10-28 LAB — CBC WITH DIFFERENTIAL/PLATELET
Abs Immature Granulocytes: 0.55 10*3/uL — ABNORMAL HIGH (ref 0.00–0.07)
Basophils Absolute: 0.1 10*3/uL (ref 0.0–0.1)
Basophils Relative: 0 %
Eosinophils Absolute: 0 10*3/uL (ref 0.0–0.5)
Eosinophils Relative: 0 %
HCT: 34.9 % — ABNORMAL LOW (ref 39.0–52.0)
Hemoglobin: 11.1 g/dL — ABNORMAL LOW (ref 13.0–17.0)
Immature Granulocytes: 2 %
Lymphocytes Relative: 7 %
Lymphs Abs: 1.9 10*3/uL (ref 0.7–4.0)
MCH: 28.5 pg (ref 26.0–34.0)
MCHC: 31.8 g/dL (ref 30.0–36.0)
MCV: 89.7 fL (ref 80.0–100.0)
Monocytes Absolute: 1.4 10*3/uL — ABNORMAL HIGH (ref 0.1–1.0)
Monocytes Relative: 6 %
Neutro Abs: 21.1 10*3/uL — ABNORMAL HIGH (ref 1.7–7.7)
Neutrophils Relative %: 85 %
Platelets: 1114 10*3/uL (ref 150–400)
RBC: 3.89 MIL/uL — ABNORMAL LOW (ref 4.22–5.81)
RDW: 13.3 % (ref 11.5–15.5)
WBC: 25 10*3/uL — ABNORMAL HIGH (ref 4.0–10.5)
nRBC: 0 % (ref 0.0–0.2)

## 2021-10-28 LAB — COMPREHENSIVE METABOLIC PANEL
ALT: 36 U/L (ref 0–44)
AST: 35 U/L (ref 15–41)
Albumin: 2.6 g/dL — ABNORMAL LOW (ref 3.5–5.0)
Alkaline Phosphatase: 56 U/L (ref 38–126)
Anion gap: 6 (ref 5–15)
BUN: 9 mg/dL (ref 6–20)
CO2: 26 mmol/L (ref 22–32)
Calcium: 8.9 mg/dL (ref 8.9–10.3)
Chloride: 105 mmol/L (ref 98–111)
Creatinine, Ser: 1.07 mg/dL (ref 0.61–1.24)
GFR, Estimated: >60 mL/min (ref >60)
Glucose, Bld: 128 mg/dL — ABNORMAL HIGH (ref 70–99)
Potassium: 5 mmol/L (ref 3.5–5.1)
Sodium: 137 mmol/L (ref 135–145)
Total Bilirubin: 0.5 mg/dL (ref 0.3–1.2)
Total Protein: 6.8 g/dL (ref 6.5–8.1)

## 2021-10-28 LAB — BRAIN NATRIURETIC PEPTIDE: B Natriuretic Peptide: 63.4 pg/mL (ref 0.0–100.0)

## 2021-10-28 LAB — C-REACTIVE PROTEIN: CRP: 3 mg/dL — ABNORMAL HIGH (ref <1.0)

## 2021-10-28 LAB — PROCALCITONIN: Procalcitonin: <0.1 ng/mL

## 2021-10-28 LAB — MAGNESIUM: Magnesium: 2 mg/dL (ref 1.7–2.4)

## 2021-10-28 MED ORDER — SODIUM ZIRCONIUM CYCLOSILICATE 10 G PO PACK
10.0000 g | PACK | Freq: Once | ORAL | Status: AC
  Administered 2021-10-28: 10 g via ORAL
  Filled 2021-10-28: qty 1

## 2021-10-28 MED ORDER — KETOROLAC TROMETHAMINE 30 MG/ML IJ SOLN
30.0000 mg | Freq: Four times a day (QID) | INTRAMUSCULAR | Status: DC
  Administered 2021-10-28 – 2021-10-29 (×5): 30 mg via INTRAVENOUS
  Filled 2021-10-28 (×5): qty 1

## 2021-10-28 MED ORDER — HYDROCODONE-ACETAMINOPHEN 5-325 MG PO TABS: 1.0000 | ORAL_TABLET | Freq: Four times a day (QID) | ORAL | Status: DC | PRN

## 2021-10-28 NOTE — Discharge Instructions (Addendum)
Follow with Primary MD in 7 days   Get CBC, CMP, Magnesium, 2 view Chest X ray -  checked next visit within 1 week by Primary MD    Activity: As tolerated with Full fall precautions use walker/cane & assistance as needed  Disposition Home    Diet: Heart Healthy    Special Instructions: If you have smoked or chewed Tobacco  in the last 2 yrs please stop smoking, stop any regular Alcohol  and or any Recreational drug use.  On your next visit with your primary care physician please Get Medicines reviewed and adjusted.  Please request your Prim.MD to go over all Hospital Tests and Procedure/Radiological results at the follow up, please get all Hospital records sent to your Prim MD by signing hospital release before you go home.  If you experience worsening of your admission symptoms, develop shortness of breath, life threatening emergency, suicidal or homicidal thoughts you must seek medical attention immediately by calling 911 or calling your MD immediately  if symptoms less severe.  You Must read complete instructions/literature along with all the possible adverse reactions/side effects for all the Medicines you take and that have been prescribed to you. Take any new Medicines after you have completely understood and accpet all the possible adverse reactions/side effects.      Robot-Assisted Thoracic Surgery, Care After The following information offers guidance on how to care for yourself after your procedure. Your health care provider may also give you more specific instructions. If you have problems or questions, contact your health care provider. What can I expect after the procedure? After the procedure, it is common to have: Some pain and aches in the area of your surgical incisions. Pain when breathing in (inhaling) and coughing. Tiredness (fatigue). Trouble sleeping. Constipation. Follow these instructions at home: Medicines Take over-the-counter and prescription medicines only  as told by your health care provider. If you were prescribed an antibiotic medicine, take it as told by your health care provider. Do not stop taking the antibiotic even if you start to feel better. Talk with your health care provider about safe and effective ways to manage pain after your procedure. Pain management should fit your specific health needs. Take pain medicine before pain becomes severe. Relieving and controlling your pain will make breathing easier for you. Ask your health care provider if the medicine prescribed to you requires you to avoid driving or using machinery. Eating and drinking Follow instructions from your health care provider about eating or drinking restrictions. These will vary depending on what procedure you had. Your health care provider may recommend: A liquid diet or soft diet for the first few days. Meals that are smaller and more frequent. A diet of fruits, vegetables, whole grains, and low-fat proteins. Limiting foods that are high in fat and processed sugar, including fried or sweet foods. Incision care Follow instructions from your health care provider about how to take care of your incisions. Make sure you: Wash your hands with soap and water for at least 20 seconds before and after you change your bandage (dressing). If soap and water are not available, use hand sanitizer. Change your dressing as told by your health care provider. Leave stitches (sutures), skin glue, or adhesive strips in place. These skin closures may need to stay in place for 2 weeks or longer. If adhesive strip edges start to loosen and curl up, you may trim the loose edges. Do not remove adhesive strips completely unless your health care provider  tells you to do that. Check your incision area every day for signs of infection. Check for: Redness, swelling, or more pain. Fluid or blood. Warmth. Pus or a bad smell. Activity Return to your normal activities as told by your health care  provider. Ask your health care provider what activities are safe for you. Ask your health care provider when it is safe for you to drive. Do not lift anything that is heavier than 10 lb (4.5 kg), or the limit that you are told, until your health care provider says that it is safe. Rest as told by your health care provider. Avoid sitting for a long time without moving. Get up to take short walks every 1-2 hours. This is important to improve blood flow and breathing. Ask for help if you feel weak or unsteady. Do exercises as told by your health care provider. Pneumonia prevention  Do deep breathing exercises and cough regularly as directed. This helps clear mucus and opens your lungs. Doing this helps prevent lung infection (pneumonia). If you were given an incentive spirometer, use it as told. An incentive spirometer is a tool that measures how well you are filling your lungs with each breath. Coughing may hurt less if you try to support your chest. This is called splinting. Try one of these when you cough: Hold a pillow against your chest. Place the palms of both hands on top of your incision area. Do not use any products that contain nicotine or tobacco. These products include cigarettes, chewing tobacco, and vaping devices, such as e-cigarettes. If you need help quitting, ask your health care provider. Avoid secondhand smoke. General instructions If you have a drainage tube: Follow instructions from your health care provider about how to take care of it. Do not travel by airplane after your tube is removed until your health care provider tells you it is safe. You may need to take these actions to prevent or treat constipation: Drink enough fluid to keep your urine pale yellow. Take over-the-counter or prescription medicines. Eat foods that are high in fiber, such as beans, whole grains, and fresh fruits and vegetables. Limit foods that are high in fat and processed sugars, such as fried or  sweet foods. Keep all follow-up visits. This is important. Contact a health care provider if: You have redness, swelling, or more pain around an incision. You have fluid or blood coming from an incision. An incision feels warm to the touch. You have pus or a bad smell coming from an incision. You have a fever. You cannot eat or drink without vomiting. Your pain medicine is not controlling your pain. Get help right away if: You have chest pain. Your heart is beating quickly. You have trouble breathing. You have trouble speaking. You are confused. You feel weak or dizzy, or you faint. These symptoms may represent a serious problem that is an emergency. Do not wait to see if the symptoms will go away. Get medical help right away. Call your local emergency services (911 in the [place] Do not drive yourself to the hospital. Summary Talk with your health care provider about safe and effective ways to manage pain after your procedure. Pain management should fit your specific health needs. Return to your normal activities as told by your health care provider. Ask your health care provider what activities are safe for you. Do deep breathing exercises and cough regularly as directed. This helps to clear mucus and prevent pneumonia. If it hurts to cough, ease  pain by holding a pillow against your chest or by placing the palms of both hands over your incisions. This information is not intended to replace advice given to you by your health care provider. Make sure you discuss any questions you have with your health care provider. Document Revised: 11/08/2019 Document Reviewed: 11/08/2019 Elsevier Patient Education  2023 [employer].

## 2021-10-28 NOTE — Progress Notes (Addendum)
      [address]       [place] [address]             [phone]       1 Day Post-Op Procedure(s) (LRB): VIDEO ASSISTED THORACOSCOPY (VATS)/DECORTICATION (Right)  Subjective: Patient sitting on edge of bed. He has incisional pain this am.  Objective: Vital signs in last 24 hours: Temp:  [97.9 F (36.6 C)-99.5 F (37.5 C)] 98.8 F (37.1 C) (08/31 0356) Pulse Rate:  [79-107] 81 (08/31 0356) Cardiac Rhythm: Normal sinus rhythm (08/30 2012) Resp:  [12-25] 12 (08/31 0356) BP: (109-129)/(73-88) 127/87 (08/31 0356) SpO2:  [90 %-95 %] 95 % (08/31 0356) Weight:  [123.7 kg] 123.7 kg (08/31 0654)     Intake/Output from previous day: 08/30 0701 - 08/31 0700 In: 1020 [I.V.:1010] Out: 320 [Urine:150; Blood:20; Chest Tube:150]   Physical Exam:  Cardiovascular: RRR Pulmonary: Clear to auscultation on the left and coarse on right Abdomen: Soft, non tender, bowel sounds present. Extremities: Trace lower extremity edema. Wounds: Clean and dry.  No erythema or signs of infection. Chest Tube: to suction, no air leak  Lab Results: CBC: Recent Labs    10/27/21 0659 10/28/21 0247  WBC 15.2* 25.0*  HGB 10.4* 11.1*  HCT 31.8* 34.9*  PLT 998* 1,114*   BMET:  Recent Labs    10/27/21 0659 10/28/21 0247  NA 139 137  K 4.1 5.0  CL 102 105  CO2 24 26  GLUCOSE 102* 128*  BUN <5* 9  CREATININE 1.09 1.07  CALCIUM 8.8* 8.9    PT/INR:  Recent Labs    10/27/21 0659  LABPROT 14.4  INR 1.1   ABG:  INR: Will add last result for INR, ABG once components are confirmed Will add last 4 CBG results once components are confirmed  Assessment/Plan:  1. CV - SR with HR in the 70-80's. On Amlodipine 10 mg daily, Coreg 25 mg bid, and Hydralazine PRN 2.  Pulmonary - On room air. Chest tube recorded with 150 cc since surgery. Chest tube to suction, no air leak. Chest tube to remain to suction for 48 hours (s/p decortication). CXR not taken yet;will follow up once  taken On Mucinex for cough. Encourage incentive spirometer. 3. Expected post op blood loss anemia-H and H this am stable at 11.1 and 34.9 4. Acute on chronic thrombocytosis-platelets this am 1,114 5. ID-on Cefepime for empyema. Await OR cultures 6. Regarding pain control, on Dilaudid PRN, Oxy PRN, [place] PRN. Will add Toradol and stop Nelwyn Salisbury ZimmermanPA-C 10/28/2021,7:04 AM [phone]   Agree with above. We will continue chest tube to suction for 48 hours.  Correna Meacham Keane Scrape

## 2021-10-28 NOTE — Progress Notes (Addendum)
PROGRESS NOTE                                                                                                                                                                                                             Patient Demographics:    Todd Pena, is a 36 y.o. male, DOB - 1985/09/17, TZG:[id number]  Outpatient Primary MD for the patient is Patient, No Pcp Per    LOS - 12  Admit date - 10/16/2021    Chief Complaint  Patient presents with   Shortness of Breath       Brief Narrative (HPI from H&P)   36 y.o. male with PMH significant for morbid obesity, hypertension 8/16, patient presented to the ED with complaint of cough, chest pain.  Chest x-ray showed right lower lobe pneumonia and he was discharged home with a course of doxycycline.  Despite compliance to the antibiotic, patient did not improve and he returned to [facility] long ED on 10/16/21 with shortness of breath and cough, was diagnosed with right-sided empyema along with acute hypoxic respiratory failure, he was seen by pulmonary critical care, he underwent ultrasound-guided thoracentesis followed by Victanyl chest tube catheter placed by IR however continued to have signs of ongoing infection and empyema.  His case was discussed by pulmonary critical care physician at [facility] with cardiothoracic surgeon Dr. Cliffton Asters for possible VATS procedure and he was transferred to [facility] under my care on 10/27/2021 for further care.   8/16 ER assessment >> dx with CAP and d/c home with doxycycline 8/19 Admit, start IV ABX. CT angio chest 8/19 >> moderate loculated Rt pleural effusion, trace Lt pleural effusion, extensive consolidation and ATX especially at Rt lung base 8/20 bedside u/s >> only small fluid pocket 8/21 IR assessment >> only small fluid pocket 8/23 progression of Rt pleural effusion  Echocardiogram.  EF 60%.  No acute changes 8/24 IR  reconsulted >> 12 F Rt pigtail chest tube placed Rt pleural fluid 8/24 >> glucose 48, protein 5.3, LDH 1328, WBC 637 (93% neutrophils) - exudative  8/25 start pleurolytic therapy 8/26 2nd pleurolytic therapy 8/27 off O2 8/28 repeat CT> mostly unchanged, 3rd pleurolytic therapy, still fevering 101.1   Subjective:   Patient in bed, appears comfortable, denies any headache, no fever, no shortness of breath , no abdominal pain. No new  focal weakness.  Some right-sided chest wall discomfort around the chest tube site.   Assessment  & Plan :    Sepsis present on admission due to recurrent pneumonia and right-sided empyema. He has been seen by pulmonary critical care at [facility] underwent ultrasound-guided thoracentesis followed by chest tube placement on 10/21/2021 however empyema persisted, IV antibiotics continued, sepsis pathophysiology has resolved.  He was seen by both pulmonary and cardiothoracic surgery, he underwent Right video assisted thoracoscopy, Decortication with right-sided chest tube placement by Dr. Cliffton Asters on 10/27/2021.  Continue supportive care with antibiotics and pain control.   Hemoptysis - Related to pneumonia, now stable.  Continue to monitor.   Acute on chronic thrombocytosis - Baseline lately count several years ago 600 now elevated due to empyema, peripheral smear was stable, continue to monitor closely definitely will require outpatient hematology follow-up, if rises further will involve hematology here.    Acute metabolic encephalopathy, POA - Patient was confused at the time of admission due to sepsis now resolved.   Essential hypertension, poorly controlled - Blood pressure currently remains controlled on Coreg 25 mg twice daily, amlodipine to 10 mg p.o. daily and as needed hydralazine.   Insomnia - Melatonin 3 mg p.o. nightly, trazodone 100mg  PO qHS PRN   Morbid obesity  -Body mass index is 39.7 kg/m.  With PCP postdischarge for weight loss.        Condition - Fair  Family Communication  :  None present  Code Status :  Full  Consults  :  PCCM, CTVS  PUD Prophylaxis :     Procedures  :     Right video assisted thoracoscopy, Decortication with right-sided chest tube placement by Dr. on 10/27/2021.  CT -  Interval placement of pigtail drainage catheter in the right lung base. Grossly stable loculated right pleural effusion is noted with associated right basilar atelectasis or infiltrate, consistent with empyema as noted on prior exam.       Disposition Plan  :    Status is: Inpatient  DVT Prophylaxis  :  Lovenox    Lab Results  Component Value Date   PLT 1,114 (HH) 10/28/2021    Diet :  Diet Order             Diet Heart Room service appropriate? Yes with Assist; Fluid consistency: Thin  Diet effective now                    Inpatient Medications  Scheduled Meds:  amLODipine  10 mg Oral Daily   carvedilol  25 mg Oral BID WC   enoxaparin (LOVENOX) injection  60 mg Subcutaneous Q24H   guaiFENesin  1,200 mg Oral BID   ketorolac  30 mg Intravenous Q6H   melatonin  3 mg Oral QHS   sodium chloride flush  10 mL Intrapleural Q8H   sodium chloride flush  10 mL Intrapleural Q8H   Continuous Infusions:  sodium chloride 10 mL/hr at 10/27/21 2203   ceFEPime (MAXIPIME) IV 2 g (10/28/21 0700)   PRN Meds:.sodium chloride, acetaminophen, guaiFENesin-dextromethorphan, hydrALAZINE, HYDROmorphone (DILAUDID) injection, ipratropium-albuterol, ondansetron **OR** ondansetron (ZOFRAN) IV, oxyCODONE, senna-docusate, traZODone  Time Spent in minutes  30   10/30/21 M.D on 10/28/2021 at 10:37 AM  To page go to [email or web address]   [facility] -  Office  [phone]  See all Orders from today for further details    Objective:   Vitals:   10/27/21 2019 10/28/21 0356 10/28/21 0654 10/28/21  0900  BP: 129/81 127/87  (!) 134/94  Pulse: 89 81  90  Resp: 12 12  19   Temp: 99.5 F (37.5 C) 98.8 F  (37.1 C)  98.6 F (37 C)  TempSrc: Oral Oral  Oral  SpO2: 90% 95%  91%  Weight:   123.7 kg   Height:        Wt Readings from Last 3 Encounters:  10/28/21 123.7 kg  10/13/21 127 kg  04/06/21 123.8 kg     Intake/Output Summary (Last 24 hours) at 10/28/2021 1037 Last data filed at 10/28/2021 0357 Gross per 24 hour  Intake 1500 ml  Output 320 ml  Net 1180 ml     Physical Exam  Awake Alert, No new F.N deficits, Normal affect [place].AT,PERRAL Supple Neck, No JVD,   Symmetrical Chest wall movement, Good air movement bilaterally, right-sided chest tube in place RRR,No Gallops, Rubs or new Murmurs,  +ve B.Sounds, Abd Soft, No tenderness,   No Cyanosis, Clubbing or edema     Data Review:    CBC Recent Labs  Lab 10/24/21 1103 10/27/21 0659 10/28/21 0247  WBC 20.4* 15.2* 25.0*  HGB 11.7* 10.4* 11.1*  HCT 36.3* 31.8* 34.9*  PLT 927* 998* 1,114*  MCV 88.8 88.1 89.7  MCH 28.6 28.8 28.5  MCHC 32.2 32.7 31.8  RDW 13.1 13.2 13.3  LYMPHSABS  --   --  1.9  MONOABS  --   --  1.4*  EOSABS  --   --  0.0  BASOSABS  --   --  0.1    Electrolytes Recent Labs  Lab 10/22/21 0509 10/24/21 1103 10/25/21 0440 10/27/21 0659 10/28/21 0247  NA 136 137 137 139 137  K 3.6 3.9 4.7 4.1 5.0  CL 98 102 103 102 105  CO2 32 26 24 24 26   GLUCOSE 114* 130* 95 102* 128*  BUN 9 9 8  <5* 9  CREATININE 1.15 0.99 0.94 1.09 1.07  CALCIUM 8.4* 8.2* 8.5* 8.8* 8.9  AST 27  --   --  31 35  ALT 24  --   --  33 36  ALKPHOS 76  --   --  46 56  BILITOT 1.0  --   --  0.7 0.5  ALBUMIN 2.8*  --   --  2.4* 2.6*  MG  --   --   --  2.1 2.0  CRP  --   --   --  3.8* 3.0*  PROCALCITON 0.36  --   --  <0.10 <0.10  INR  --   --   --  1.1  --   BNP  --   --   --  42.4 63.4    ID Labs Recent Labs  Lab 10/22/21 0509 10/24/21 1103 10/25/21 0440 10/27/21 0659 10/28/21 0247  WBC  --  20.4*  --  15.2* 25.0*  PLT  --  927*  --  998* 1,114*  CRP  --   --   --  3.8* 3.0*  PROCALCITON 0.36  --   --  <0.10  <0.10  CREATININE 1.15 0.99 0.94 1.09 1.07    Radiology Reports DG Chest Port 1 View  Result Date: 10/28/2021 CLINICAL DATA:  Chest tube present.  Pleural effusion. EXAM: PORTABLE CHEST 1 VIEW COMPARISON:  AP chest 10/26/2021 and CT chest 10/25/2021 FINDINGS: Interval removal of the prior right lower hemithorax pigtail drainage catheter. Interval placement of new larger caliber right-sided chest tube with tip overlying the superior right hemithorax. Cardiac  silhouette and mediastinal contours are within normal limits. Mildly decreased lung volumes. There is slightly improved aeration of the right lung base with persistent predominantly horizontal linear opacities including the fluid previously seen within the right minor fissure on prior CT. There is again right inferolateral pleural thickening. The left lung is clear. No pleural effusion or pneumothorax. Mild subcutaneous air lateral to the lower right ribs. No acute skeletal abnormality. IMPRESSION: 1. Interval removal of prior right basilar pigtail drainage catheter interval placement of new larger caliber right-sided chest tube. No pneumothorax. 2. Persistent linear opacities and pleural thickening within the right lower hemithorax, a combination of pleural effusion and airspace opacities. Note is made of concern for loculated pleural effusion on recent CT. Electronically Signed   By: Neita Garnet M.D.   On: 10/28/2021 08:18   DG Chest Port 1 View  Result Date: 10/26/2021 CLINICAL DATA:  Empyema EXAM: PORTABLE CHEST 1 VIEW COMPARISON:  10/25/2021, 10/24/2021 FINDINGS: Right basilar chest tube remains in place. Persistent small-moderate right pleural effusion with associated right basilar opacity. Heart size is normal. Left lung remains clear. No pneumothorax IMPRESSION: Persistent small-moderate right pleural effusion with associated right basilar opacity. Right chest tube remains in place. Electronically Signed   By: Duanne Guess D.O.   On:  10/26/2021 11:38   CT CHEST WO CONTRAST  Result Date: 10/25/2021 CLINICAL DATA:  Empyema. EXAM: CT CHEST WITHOUT CONTRAST TECHNIQUE: Multidetector CT imaging of the chest was performed following the standard protocol without IV contrast. RADIATION DOSE REDUCTION: This exam was performed according to the departmental dose-optimization program which includes automated exposure control, adjustment of the mA and/or kV according to patient size and/or use of iterative reconstruction technique. COMPARISON:  October 16, 2021. FINDINGS: Cardiovascular: No significant vascular findings. Normal heart size. No pericardial effusion. Mediastinum/Nodes: No enlarged mediastinal or axillary lymph nodes. Thyroid gland, trachea, and esophagus demonstrate no significant findings. Lungs/Pleura: No pneumothorax is noted. Left lung is clear. There is been interval placement of pigtail drainage catheter into the right lung base. There is continued presence of loculated pleural effusion with associated atelectasis or pneumonia as noted on prior exam, consistent with empyema. This is not significantly changed compared to prior exam. Upper Abdomen: No acute abnormality. Musculoskeletal: No chest wall mass or suspicious bone lesions identified. IMPRESSION: Interval placement of pigtail drainage catheter in the right lung base. Grossly stable loculated right pleural effusion is noted with associated right basilar atelectasis or infiltrate, consistent with empyema as noted on prior exam. Electronically Signed   By: Lupita Raider M.D.   On: 10/25/2021 10:50

## 2021-10-28 NOTE — Progress Notes (Signed)
Pharmacy Antibiotic Note  Todd Pena is a 36 y.o. male admitted on 10/16/2021 with pneumonia.  Pt is s/p VATS on 8/30.  Patient continues on cefepime dosing for empyema.  Today is total abx d#13, cefepime d#8.  WBC elevated at 25.  Renal fxn stable.   Plan: Continue Cefepime 2 g IV q8h Monitor renal function Follow-up intra-op cx - ngtd x 3 days   Height: 5\' 11"  (180.3 cm) Weight: 123.7 kg (272 lb 11.3 oz) IBW/kg (Calculated) : 75.3  Temp (24hrs), Avg:98.5 F (36.9 C), Min:97.9 F (36.6 C), Max:99.5 F (37.5 C)  Recent Labs  Lab 10/22/21 0509 10/24/21 1103 10/25/21 0440 10/27/21 0659 10/28/21 0247  WBC  --  20.4*  --  15.2* 25.0*  CREATININE 1.15 0.99 0.94 1.09 1.07     Estimated Creatinine Clearance: 129.1 mL/min (by C-G formula based on SCr of 1.07 mg/dL).    No Known Allergies  Antimicrobials this admission: Piperacillin/tazobactam 8/19 >> 8/23 Cefdinir + metronidazole 8/23 >> 8/24 Cefepime 8/24 >>  Dose adjustments this admission:  Microbiology results: 8/19 BCx: ngF 8/19 Sputum: Sample not acceptable for testing  8/21 resp panel: negative 8/24 AFB x 2 sent 8/24 pleural fluid: ngtd x 3 days   9/24, Pharm.D., BCPS Clinical Pharmacist Clinical phone for 10/28/2021 from 7:30-3:00 is [phone].  **Pharmacist phone directory can be found on [email or web address] listed under [facility].  10/28/2021 8:32 AM

## 2021-10-28 NOTE — Anesthesia Postprocedure Evaluation (Signed)
Anesthesia Post Note  Patient: Todd Pena  Procedure(s) Performed: VIDEO ASSISTED THORACOSCOPY (VATS)/DECORTICATION (Right: Chest)     Patient location during evaluation: PACU Anesthesia Type: General Level of consciousness: sedated and patient cooperative Pain management: pain level controlled Vital Signs Assessment: post-procedure vital signs reviewed and stable Respiratory status: spontaneous breathing Cardiovascular status: stable Anesthetic complications: no   No notable events documented.  Last Vitals:  Vitals:   10/28/21 0900 10/28/21 1252  BP: (!) 134/94 116/72  Pulse: 90 78  Resp: 19 20  Temp: 37 C 37.1 C  SpO2: 91% 93%    Last Pain:  Vitals:   10/28/21 1252  TempSrc: Oral  PainSc:                  [name]

## 2021-10-29 ENCOUNTER — Inpatient Hospital Stay (HOSPITAL_COMMUNITY): Payer: Self-pay

## 2021-10-29 LAB — CBC WITH DIFFERENTIAL/PLATELET
Abs Immature Granulocytes: 0.39 10*3/uL — ABNORMAL HIGH (ref 0.00–0.07)
Basophils Absolute: 0.2 10*3/uL — ABNORMAL HIGH (ref 0.0–0.1)
Basophils Relative: 1 %
Eosinophils Absolute: 0.1 10*3/uL (ref 0.0–0.5)
Eosinophils Relative: 1 %
HCT: 33.4 % — ABNORMAL LOW (ref 39.0–52.0)
Hemoglobin: 10.6 g/dL — ABNORMAL LOW (ref 13.0–17.0)
Immature Granulocytes: 2 %
Lymphocytes Relative: 12 %
Lymphs Abs: 2.6 10*3/uL (ref 0.7–4.0)
MCH: 28.6 pg (ref 26.0–34.0)
MCHC: 31.7 g/dL (ref 30.0–36.0)
MCV: 90.3 fL (ref 80.0–100.0)
Monocytes Absolute: 2 10*3/uL — ABNORMAL HIGH (ref 0.1–1.0)
Monocytes Relative: 9 %
Neutro Abs: 15.8 10*3/uL — ABNORMAL HIGH (ref 1.7–7.7)
Neutrophils Relative %: 75 %
Platelets: 1065 10*3/uL (ref 150–400)
RBC: 3.7 MIL/uL — ABNORMAL LOW (ref 4.22–5.81)
RDW: 13.7 % (ref 11.5–15.5)
WBC: 21 10*3/uL — ABNORMAL HIGH (ref 4.0–10.5)
nRBC: 0 % (ref 0.0–0.2)

## 2021-10-29 LAB — COMPREHENSIVE METABOLIC PANEL
ALT: 34 U/L (ref 0–44)
AST: 26 U/L (ref 15–41)
Albumin: 2.4 g/dL — ABNORMAL LOW (ref 3.5–5.0)
Alkaline Phosphatase: 52 U/L (ref 38–126)
Anion gap: 9 (ref 5–15)
BUN: 12 mg/dL (ref 6–20)
CO2: 26 mmol/L (ref 22–32)
Calcium: 8.6 mg/dL — ABNORMAL LOW (ref 8.9–10.3)
Chloride: 101 mmol/L (ref 98–111)
Creatinine, Ser: 1.35 mg/dL — ABNORMAL HIGH (ref 0.61–1.24)
GFR, Estimated: >60 mL/min (ref >60)
Glucose, Bld: 95 mg/dL (ref 70–99)
Potassium: 4 mmol/L (ref 3.5–5.1)
Sodium: 136 mmol/L (ref 135–145)
Total Bilirubin: 0.8 mg/dL (ref 0.3–1.2)
Total Protein: 6.4 g/dL — ABNORMAL LOW (ref 6.5–8.1)

## 2021-10-29 LAB — C-REACTIVE PROTEIN: CRP: 5.8 mg/dL — ABNORMAL HIGH (ref <1.0)

## 2021-10-29 LAB — ACID FAST SMEAR (AFB, MYCOBACTERIA): Acid Fast Smear: NEGATIVE

## 2021-10-29 LAB — PROCALCITONIN: Procalcitonin: <0.1 ng/mL

## 2021-10-29 LAB — BRAIN NATRIURETIC PEPTIDE: B Natriuretic Peptide: 61.4 pg/mL (ref 0.0–100.0)

## 2021-10-29 LAB — MAGNESIUM: Magnesium: 2.1 mg/dL (ref 1.7–2.4)

## 2021-10-29 MED ORDER — KETOROLAC TROMETHAMINE 15 MG/ML IJ SOLN
15.0000 mg | Freq: Four times a day (QID) | INTRAMUSCULAR | Status: DC
  Administered 2021-10-29 – 2021-10-30 (×3): 15 mg via INTRAVENOUS
  Filled 2021-10-29 (×3): qty 1

## 2021-10-29 NOTE — Progress Notes (Signed)
PROGRESS NOTE                                                                                                                                                                                                             Patient Demographics:    Todd Pena, is a 36 y.o. male, DOB - 1985/12/20, UQJ:[id number]  Outpatient Primary MD for the patient is Patient, No Pcp Per    LOS - 13  Admit date - 10/16/2021    Chief Complaint  Patient presents with   Shortness of Breath       Brief Narrative (HPI from H&P)   36 y.o. male with PMH significant for morbid obesity, hypertension 8/16, patient presented to the ED with complaint of cough, chest pain.  Chest x-ray showed right lower lobe pneumonia and he was discharged home with a course of doxycycline.  Despite compliance to the antibiotic, patient did not improve and he returned to [facility] long ED on 10/16/21 with shortness of breath and cough, was diagnosed with right-sided empyema along with acute hypoxic respiratory failure, he was seen by pulmonary critical care, he underwent ultrasound-guided thoracentesis followed by Victanyl chest tube catheter placed by IR however continued to have signs of ongoing infection and empyema.  His case was discussed by pulmonary critical care physician at [facility] with cardiothoracic surgeon Dr. Cliffton Asters for possible VATS procedure and he was transferred to [facility] under my care on 10/27/2021 for further care.   8/16 ER assessment >> dx with CAP and d/c home with doxycycline 8/19 Admit, start IV ABX. CT angio chest 8/19 >> moderate loculated Rt pleural effusion, trace Lt pleural effusion, extensive consolidation and ATX especially at Rt lung base 8/20 bedside u/s >> only small fluid pocket 8/21 IR assessment >> only small fluid pocket 8/23 progression of Rt pleural effusion  Echocardiogram.  EF 60%.  No acute changes 8/24 IR  reconsulted >> 12 F Rt pigtail chest tube placed Rt pleural fluid 8/24 >> glucose 48, protein 5.3, LDH 1328, WBC 637 (93% neutrophils) - exudative  8/25 start pleurolytic therapy 8/26 2nd pleurolytic therapy 8/27 off O2 8/28 repeat CT> mostly unchanged, 3rd pleurolytic therapy, still fevering 101.1   Subjective:   Patient in bed, appears comfortable, denies any headache, no fever, no chest pain or pressure, no shortness of breath ,  no abdominal pain. No new focal weakness.Improved right-sided chest wall discomfort around the chest tube site.   Assessment  & Plan :    Sepsis present on admission due to recurrent pneumonia and right-sided empyema. He has been seen by pulmonary critical care at [facility] long hospital underwent ultrasound-guided thoracentesis followed by chest tube placement on 10/21/2021 however empyema persisted, IV antibiotics continued, sepsis pathophysiology has resolved.  He was seen by both pulmonary and cardiothoracic surgery, he underwent Right video assisted thoracoscopy, Decortication with right-sided chest tube placement by Dr. Cliffton AstersLightfoot on 10/27/2021.  Continue supportive care with antibiotics and pain control.   Hemoptysis - Related to pneumonia, now stable.  Continue to monitor.   Acute on chronic thrombocytosis - Baseline lately count several years ago 600 now elevated due to empyema, peripheral smear was stable, continue to monitor closely definitely will require outpatient hematology follow-up, if rises further will involve hematology here.    Acute metabolic encephalopathy, POA - Patient was confused at the time of admission due to sepsis now resolved.   Essential hypertension, poorly controlled - Blood pressure currently remains controlled on Coreg 25 mg twice daily, amlodipine to 10 mg p.o. daily and as needed hydralazine.   Insomnia - Melatonin 3 mg p.o. nightly, trazodone 100mg  PO qHS PRN   Morbid obesity  -Body mass index is 39.7 kg/m.  With PCP  postdischarge for weight loss.       Condition - Fair  Family Communication  :  None present  Code Status :  Full  Consults  :  PCCM, CTVS  PUD Prophylaxis :     Procedures  :     Right video assisted thoracoscopy, Decortication with right-sided chest tube placement by Dr. Cliffton AstersLightfoot on 10/27/2021.  CT -  Interval placement of pigtail drainage catheter in the right lung base. Grossly stable loculated right pleural effusion is noted with associated right basilar atelectasis or infiltrate, consistent with empyema as noted on prior exam.       Disposition Plan  :    Status is: Inpatient  DVT Prophylaxis  :  Lovenox    Lab Results  Component Value Date   PLT 1,065 (HH) 10/29/2021    Diet :  Diet Order             Diet Heart Room service appropriate? Yes with Assist; Fluid consistency: Thin  Diet effective now                    Inpatient Medications  Scheduled Meds:  amLODipine  10 mg Oral Daily   carvedilol  25 mg Oral BID WC   enoxaparin (LOVENOX) injection  60 mg Subcutaneous Q24H   guaiFENesin  1,200 mg Oral BID   ketorolac  15 mg Intravenous Q6H   melatonin  3 mg Oral QHS   Continuous Infusions:  sodium chloride Stopped (10/28/21 0854)   ceFEPime (MAXIPIME) IV 2 g (10/29/21 0511)   PRN Meds:.sodium chloride, acetaminophen, guaiFENesin-dextromethorphan, hydrALAZINE, HYDROmorphone (DILAUDID) injection, ipratropium-albuterol, ondansetron **OR** ondansetron (ZOFRAN) IV, oxyCODONE, senna-docusate, traZODone  Time Spent in minutes  30   Susa RaringPrashant Ailea Rhatigan M.D on 10/29/2021 at 10:39 AM  To page go to [email or web address]   [facility] -  Office  [phone]  See all Orders from today for further details    Objective:   Vitals:   10/28/21 2021 10/28/21 2345 10/29/21 0352 10/29/21 0722  BP: 128/78 130/86 (!) 145/87 (!) 146/81  Pulse: 91 87 87 89  Resp: 20 18  20 20  Temp: 99.5 F (37.5 C) 99.2 F (37.3 C) 98.7 F (37.1 C) 99.1 F (37.3 C)   TempSrc: Oral Oral Oral Oral  SpO2: 94% 91% 94% 95%  Weight:      Height:        Wt Readings from Last 3 Encounters:  10/28/21 123.7 kg  10/13/21 127 kg  04/06/21 123.8 kg     Intake/Output Summary (Last 24 hours) at 10/29/2021 1039 Last data filed at 10/29/2021 0511 Gross per 24 hour  Intake 1499.71 ml  Output 90 ml  Net 1409.71 ml     Physical Exam  Awake Alert, No new F.N deficits, Normal affect [place].AT,PERRAL Supple Neck, No JVD,   Symmetrical Chest wall movement, Good air movement bilaterally, right-sided chest tube in place RRR,No Gallops, Rubs or new Murmurs,  +ve B.Sounds, Abd Soft, No tenderness,   No Cyanosis, Clubbing or edema    Data Review:    CBC Recent Labs  Lab 10/24/21 1103 10/27/21 0659 10/28/21 0247 10/29/21 0437  WBC 20.4* 15.2* 25.0* 21.0*  HGB 11.7* 10.4* 11.1* 10.6*  HCT 36.3* 31.8* 34.9* 33.4*  PLT 927* 998* 1,114* 1,065*  MCV 88.8 88.1 89.7 90.3  MCH 28.6 28.8 28.5 28.6  MCHC 32.2 32.7 31.8 31.7  RDW 13.1 13.2 13.3 13.7  LYMPHSABS  --   --  1.9 2.6  MONOABS  --   --  1.4* 2.0*  EOSABS  --   --  0.0 0.1  BASOSABS  --   --  0.1 0.2*    Electrolytes Recent Labs  Lab 10/24/21 1103 10/25/21 0440 10/27/21 0659 10/28/21 0247 10/29/21 0437  NA 137 137 139 137 136  K 3.9 4.7 4.1 5.0 4.0  CL 102 103 102 105 101  CO2 26 24 24 26 26   GLUCOSE 130* 95 102* 128* 95  BUN 9 8 <5* 9 12  CREATININE 0.99 0.94 1.09 1.07 1.35*  CALCIUM 8.2* 8.5* 8.8* 8.9 8.6*  AST  --   --  31 35 26  ALT  --   --  33 36 34  ALKPHOS  --   --  46 56 52  BILITOT  --   --  0.7 0.5 0.8  ALBUMIN  --   --  2.4* 2.6* 2.4*  MG  --   --  2.1 2.0 2.1  CRP  --   --  3.8* 3.0* 5.8*  PROCALCITON  --   --  <0.10 <0.10 <0.10  INR  --   --  1.1  --   --   BNP  --   --  42.4 63.4 61.4    ID Labs Recent Labs  Lab 10/24/21 1103 10/25/21 0440 10/27/21 0659 10/28/21 0247 10/29/21 0437  WBC 20.4*  --  15.2* 25.0* 21.0*  PLT 927*  --  998* 1,114* 1,065*  CRP  --    --  3.8* 3.0* 5.8*  PROCALCITON  --   --  <0.10 <0.10 <0.10  CREATININE 0.99 0.94 1.09 1.07 1.35*    Radiology Reports DG CHEST PORT 1 VIEW  Result Date: 10/29/2021 CLINICAL DATA:  Shortness of breath and chest pain. Chest tube. Evaluate pigtail drainage catheter. Evaluate for pneumothorax. Pleural effusion. EXAM: PORTABLE CHEST 1 VIEW COMPARISON:  AP chest 10/28/2021 CT chest 10/25/2021 FINDINGS: Redemonstration of right-sided chest tube with tip overlying the superior right hemithorax. Mild subcutaneous air is again seen along the inferolateral right hemithorax. Cardiac silhouette and mediastinal contours are within normal limits. There  is mild-to-moderate elevation of the right hemidiaphragm, unchanged. No significant change in inferolateral right pleural thickening or pleural fluid within the right minor fissure and heterogeneous right basilar airspace opacification. The left lung is clear. Possible minimal extrapleural air measuring only up to 2 mm in craniocaudal thickness at the superolateral aspect of the right hemithorax appears unchanged from prior 10/28/2021 at 7:34 a.m. radiograph. No acute skeletal abnormality. IMPRESSION: 1. No significant change from prior. 2. Right-sided chest tube is unchanged in position. Possible minimal 2 mm right apical pneumothorax appears not significantly changed prior 10/28/2021 at 7:34 a.m. radiograph. 3. Right basilar atelectasis, pleural thickening, and fluid within the right minor fissure appear unchanged. Electronically Signed   By: Neita Garnet M.D.   On: 10/29/2021 08:12   DG Chest Port 1 View  Result Date: 10/28/2021 CLINICAL DATA:  Chest tube present.  Pleural effusion. EXAM: PORTABLE CHEST 1 VIEW COMPARISON:  AP chest 10/26/2021 and CT chest 10/25/2021 FINDINGS: Interval removal of the prior right lower hemithorax pigtail drainage catheter. Interval placement of new larger caliber right-sided chest tube with tip overlying the superior right hemithorax.  Cardiac silhouette and mediastinal contours are within normal limits. Mildly decreased lung volumes. There is slightly improved aeration of the right lung base with persistent predominantly horizontal linear opacities including the fluid previously seen within the right minor fissure on prior CT. There is again right inferolateral pleural thickening. The left lung is clear. No pleural effusion or pneumothorax. Mild subcutaneous air lateral to the lower right ribs. No acute skeletal abnormality. IMPRESSION: 1. Interval removal of prior right basilar pigtail drainage catheter interval placement of new larger caliber right-sided chest tube. No pneumothorax. 2. Persistent linear opacities and pleural thickening within the right lower hemithorax, a combination of pleural effusion and airspace opacities. Note is made of concern for loculated pleural effusion on recent CT. Electronically Signed   By: Neita Garnet M.D.   On: 10/28/2021 08:18   DG Chest Port 1 View  Result Date: 10/26/2021 CLINICAL DATA:  Empyema EXAM: PORTABLE CHEST 1 VIEW COMPARISON:  10/25/2021, 10/24/2021 FINDINGS: Right basilar chest tube remains in place. Persistent small-moderate right pleural effusion with associated right basilar opacity. Heart size is normal. Left lung remains clear. No pneumothorax IMPRESSION: Persistent small-moderate right pleural effusion with associated right basilar opacity. Right chest tube remains in place. Electronically Signed   By: Duanne Guess D.O.   On: 10/26/2021 11:38   CT CHEST WO CONTRAST  Result Date: 10/25/2021 CLINICAL DATA:  Empyema. EXAM: CT CHEST WITHOUT CONTRAST TECHNIQUE: Multidetector CT imaging of the chest was performed following the standard protocol without IV contrast. RADIATION DOSE REDUCTION: This exam was performed according to the departmental dose-optimization program which includes automated exposure control, adjustment of the mA and/or kV according to patient size and/or use of  iterative reconstruction technique. COMPARISON:  October 16, 2021. FINDINGS: Cardiovascular: No significant vascular findings. Normal heart size. No pericardial effusion. Mediastinum/Nodes: No enlarged mediastinal or axillary lymph nodes. Thyroid gland, trachea, and esophagus demonstrate no significant findings. Lungs/Pleura: No pneumothorax is noted. Left lung is clear. There is been interval placement of pigtail drainage catheter into the right lung base. There is continued presence of loculated pleural effusion with associated atelectasis or pneumonia as noted on prior exam, consistent with empyema. This is not significantly changed compared to prior exam. Upper Abdomen: No acute abnormality. Musculoskeletal: No chest wall mass or suspicious bone lesions identified. IMPRESSION: Interval placement of pigtail drainage catheter in the right lung base.  Grossly stable loculated right pleural effusion is noted with associated right basilar atelectasis or infiltrate, consistent with empyema as noted on prior exam. Electronically Signed   By: Lupita Raider M.D.   On: 10/25/2021 10:50

## 2021-10-29 NOTE — Progress Notes (Addendum)
      [address]       [place] [address]             [phone]       2 Days Post-Op Procedure(s) (LRB): VIDEO ASSISTED THORACOSCOPY (VATS)/DECORTICATION (Right)  Subjective: Patient is frustrated. He just wants chest tube removed and to go home.  Objective: Vital signs in last 24 hours: Temp:  [98.6 F (37 C)-99.5 F (37.5 C)] 98.7 F (37.1 C) (09/01 0352) Pulse Rate:  [78-94] 87 (09/01 0352) Cardiac Rhythm: Normal sinus rhythm (08/31 2021) Resp:  [18-20] 20 (09/01 0352) BP: (116-145)/(72-94) 145/87 (09/01 0352) SpO2:  [91 %-95 %] 94 % (09/01 0352)     Intake/Output from previous day: 08/31 0701 - 09/01 0700 In: 1499.7 [P.O.:600; I.V.:98.4; IV Piggyback:801.3] Out: 90 [Chest Tube:90]   Physical Exam:  Cardiovascular: RRR Pulmonary: Clear to auscultation on the left and coarse on right (chest tube) Abdomen: Soft, non tender, bowel sounds present. Extremities: No lower extremity edema. Wounds: Clean and dry.  No erythema or signs of infection. Chest Tube: to suction, no air leak  Lab Results: CBC: Recent Labs    10/28/21 0247 10/29/21 0437  WBC 25.0* 21.0*  HGB 11.1* 10.6*  HCT 34.9* 33.4*  PLT 1,114* 1,065*    BMET:  Recent Labs    10/28/21 0247 10/29/21 0437  NA 137 136  K 5.0 4.0  CL 105 101  CO2 26 26  GLUCOSE 128* 95  BUN 9 12  CREATININE 1.07 1.35*  CALCIUM 8.9 8.6*     PT/INR:  Recent Labs    10/27/21 0659  LABPROT 14.4  INR 1.1    ABG:  INR: Will add last result for INR, ABG once components are confirmed Will add last 4 CBG results once components are confirmed  Assessment/Plan:  1. CV - SR with HR in the 90's this am. On Amlodipine 10 mg daily, Coreg 25 mg bid, and Hydralazine PRN. Per medicine 2.  Pulmonary - On room air. Chest tube recorded with 90 cc since surgery. Chest tube to suction, no air leak. As discussed with Dr. Cliffton Asters, will place chest tube to water seal.  CXR appears stable. On  Mucinex for cough. Encourage incentive spirometer. 3. Expected post op blood loss anemia-H and H this am stable at 10.6 and 33.4 4. Acute on chronic thrombocytosis-platelets this am 1,065 5. ID-WBC decreased from 25,000 to 21,000. On Cefepime for empyema. Gram stain shows no organisms, OR culture shows no growth less than 24 hours 6. Creatinine slightly increased from 1.07 to 1.35. Toradol started yesterday. Will decrease Toradol dose  Donielle M ZimmermanPA-C 10/29/2021,7:21 AM [phone]    Agree with above. We will transition to waterseal today. Chest tube removal tomorrow.  Ramirez Fullbright Keane Scrape

## 2021-10-29 NOTE — Progress Notes (Signed)
IR has been following the patient for right chest tube that was placed on 8/28.   Patient underwent VATS, now has 28 Fr chest tube that was placed during the surgery.  IR will sign off but will be available if needed.   Lynann Bologna Jamice Carreno PA-C 10/29/2021 8:55 AM

## 2021-10-29 NOTE — Plan of Care (Signed)
  Problem: Activity: Goal: Risk for activity intolerance will decrease Outcome: Progressing   Problem: Elimination: Goal: Will not experience complications related to bowel motility Outcome: Progressing Goal: Will not experience complications related to urinary retention Outcome: Progressing   Problem: Coping: Goal: Level of anxiety will decrease Outcome: Progressing   Problem: Nutrition: Goal: Adequate nutrition will be maintained Outcome: Progressing

## 2021-10-30 ENCOUNTER — Inpatient Hospital Stay (HOSPITAL_COMMUNITY): Payer: Self-pay

## 2021-10-30 LAB — COMPREHENSIVE METABOLIC PANEL
ALT: 28 U/L (ref 0–44)
AST: 19 U/L (ref 15–41)
Albumin: 2.4 g/dL — ABNORMAL LOW (ref 3.5–5.0)
Alkaline Phosphatase: 55 U/L (ref 38–126)
Anion gap: 8 (ref 5–15)
BUN: 13 mg/dL (ref 6–20)
CO2: 26 mmol/L (ref 22–32)
Calcium: 8.6 mg/dL — ABNORMAL LOW (ref 8.9–10.3)
Chloride: 105 mmol/L (ref 98–111)
Creatinine, Ser: 1.13 mg/dL (ref 0.61–1.24)
GFR, Estimated: >60 mL/min (ref >60)
Glucose, Bld: 96 mg/dL (ref 70–99)
Potassium: 4.1 mmol/L (ref 3.5–5.1)
Sodium: 139 mmol/L (ref 135–145)
Total Bilirubin: 0.7 mg/dL (ref 0.3–1.2)
Total Protein: 6.6 g/dL (ref 6.5–8.1)

## 2021-10-30 LAB — CBC WITH DIFFERENTIAL/PLATELET
Abs Immature Granulocytes: 0.44 10*3/uL — ABNORMAL HIGH (ref 0.00–0.07)
Basophils Absolute: 0.1 10*3/uL (ref 0.0–0.1)
Basophils Relative: 1 %
Eosinophils Absolute: 0.2 10*3/uL (ref 0.0–0.5)
Eosinophils Relative: 1 %
HCT: 31.4 % — ABNORMAL LOW (ref 39.0–52.0)
Hemoglobin: 10.1 g/dL — ABNORMAL LOW (ref 13.0–17.0)
Immature Granulocytes: 3 %
Lymphocytes Relative: 14 %
Lymphs Abs: 2.5 10*3/uL (ref 0.7–4.0)
MCH: 28.6 pg (ref 26.0–34.0)
MCHC: 32.2 g/dL (ref 30.0–36.0)
MCV: 89 fL (ref 80.0–100.0)
Monocytes Absolute: 1.5 10*3/uL — ABNORMAL HIGH (ref 0.1–1.0)
Monocytes Relative: 9 %
Neutro Abs: 12.3 10*3/uL — ABNORMAL HIGH (ref 1.7–7.7)
Neutrophils Relative %: 72 %
Platelets: 1027 10*3/uL (ref 150–400)
RBC: 3.53 MIL/uL — ABNORMAL LOW (ref 4.22–5.81)
RDW: 13.8 % (ref 11.5–15.5)
WBC: 17.1 10*3/uL — ABNORMAL HIGH (ref 4.0–10.5)
nRBC: 0 % (ref 0.0–0.2)

## 2021-10-30 LAB — C-REACTIVE PROTEIN: CRP: 5.3 mg/dL — ABNORMAL HIGH (ref <1.0)

## 2021-10-30 LAB — PROCALCITONIN: Procalcitonin: <0.1 ng/mL

## 2021-10-30 LAB — MAGNESIUM: Magnesium: 2.1 mg/dL (ref 1.7–2.4)

## 2021-10-30 LAB — BRAIN NATRIURETIC PEPTIDE: B Natriuretic Peptide: 60.9 pg/mL (ref 0.0–100.0)

## 2021-10-30 MED ORDER — CARVEDILOL 25 MG PO TABS
25.0000 mg | ORAL_TABLET | Freq: Two times a day (BID) | ORAL | 0 refills | Status: DC
Start: 2021-10-30 — End: 2021-12-29

## 2021-10-30 MED ORDER — SENNOSIDES-DOCUSATE SODIUM 8.6-50 MG PO TABS: 1.0000 | ORAL_TABLET | Freq: Every evening | ORAL | 0 refills | Status: DC | PRN

## 2021-10-30 MED ORDER — AMOXICILLIN-POT CLAVULANATE 875-125 MG PO TABS: 1.0000 | ORAL_TABLET | Freq: Two times a day (BID) | ORAL | 0 refills | Status: DC

## 2021-10-30 MED ORDER — OXYCODONE HCL 5 MG PO TABS: 5.0000 mg | ORAL_TABLET | Freq: Four times a day (QID) | ORAL | 0 refills | Status: DC | PRN

## 2021-10-30 MED ORDER — AMLODIPINE BESYLATE 10 MG PO TABS: 10.0000 mg | ORAL_TABLET | Freq: Every day | ORAL | 0 refills | Status: DC

## 2021-10-30 MED ORDER — ACETAMINOPHEN 325 MG PO TABS: 650.0000 mg | ORAL_TABLET | Freq: Four times a day (QID) | ORAL | 0 refills | Status: AC | PRN

## 2021-10-30 NOTE — TOC Transition Note (Signed)
Transition of Care [facility]) - CM/SW Discharge Note   Patient Details  Name: MEHTAB DOLBERRY MRN: [id number] Date of Birth: January 01, 1986  [facility] CM/SW Contact:  Kallie Locks, RN Phone Number: [phone] 10/30/2021, 12:29 PM   Clinical Narrative:   Confirmed with Mr. River he does not have insurance. Provided MATCH letter along with explanation of details to Mr. Kuri.  [facility] pharmacy on [address]. Spoke with Corrie Dandy, PharmD to confirm they will accept MATCH. Provided MATCH details to [facility]. Mr. Dolce states family will stay with him initially post hospitalization. :Provided [facility] list. Advised to call to schedule appointment. PCP follow up also on AVS. Family will transport home. No further TOC needs identified.  Please re-consult TOC if needed.     Final next level of care: Home/Self Care Barriers to Discharge: No Barriers Identified   Patient Goals and CMS Choice Patient states their goals for this hospitalization and ongoing recovery are:: to return to my home and feel well CMS Medicare.gov Compare Post Acute Care list provided to:: Patient Choice offered to / list presented to : Patient  Discharge Placement                       Discharge Plan and Services   Discharge Planning Services: CM Consult                                 Social Determinants of Health (SDOH) Interventions     Readmission Risk Interventions     No data to display

## 2021-10-30 NOTE — Plan of Care (Signed)
  Problem: Clinical Measurements: Goal: Ability to maintain clinical measurements within normal limits will improve 10/30/2021 1124 by Coralie Common, RN Outcome: Adequate for Discharge 10/30/2021 1012 by Coralie Common, RN Outcome: Progressing Goal: Will remain free from infection 10/30/2021 1124 by Coralie Common, RN Outcome: Adequate for Discharge 10/30/2021 1012 by Coralie Common, RN Outcome: Progressing Goal: Diagnostic test results will improve 10/30/2021 1124 by Coralie Common, RN Outcome: Adequate for Discharge 10/30/2021 1012 by Coralie Common, RN Outcome: Progressing Goal: Respiratory complications will improve 10/30/2021 1124 by Coralie Common, RN Outcome: Adequate for Discharge 10/30/2021 1012 by Coralie Common, RN Outcome: Progressing Goal: Cardiovascular complication will be avoided 10/30/2021 1124 by Coralie Common, RN Outcome: Adequate for Discharge 10/30/2021 1012 by Coralie Common, RN Outcome: Progressing   Problem: Activity: Goal: Risk for activity intolerance will decrease 10/30/2021 1124 by Coralie Common, RN Outcome: Adequate for Discharge 10/30/2021 1012 by Coralie Common, RN Outcome: Progressing   Problem: Nutrition: Goal: Adequate nutrition will be maintained 10/30/2021 1124 by Coralie Common, RN Outcome: Adequate for Discharge 10/30/2021 1012 by Coralie Common, RN Outcome: Progressing   Problem: Coping: Goal: Level of anxiety will decrease 10/30/2021 1124 by Coralie Common, RN Outcome: Adequate for Discharge 10/30/2021 1012 by Coralie Common, RN Outcome: Progressing   Problem: Pain Managment: Goal: General experience of comfort will improve 10/30/2021 1124 by Coralie Common, RN Outcome: Adequate for Discharge 10/30/2021 1012 by Coralie Common, RN Outcome: Progressing   Problem: Activity: Goal: Ability to tolerate increased activity will improve 10/30/2021 1124 by Coralie Common, RN Outcome: Adequate for Discharge 10/30/2021 1012 by  Coralie Common, RN Outcome: Progressing   Problem: Respiratory: Goal: Ability to maintain adequate ventilation will improve 10/30/2021 1124 by Coralie Common, RN Outcome: Adequate for Discharge 10/30/2021 1012 by Coralie Common, RN Outcome: Progressing Goal: Ability to maintain a clear airway will improve 10/30/2021 1124 by Coralie Common, RN Outcome: Adequate for Discharge 10/30/2021 1012 by Coralie Common, RN Outcome: Progressing

## 2021-10-30 NOTE — Plan of Care (Signed)
  Problem: Health Behavior/Discharge Planning: Goal: Ability to manage health-related needs will improve Outcome: Progressing   Problem: Clinical Measurements: Goal: Ability to maintain clinical measurements within normal limits will improve Outcome: Progressing Goal: Will remain free from infection Outcome: Progressing Goal: Diagnostic test results will improve Outcome: Progressing Goal: Respiratory complications will improve Outcome: Progressing Goal: Cardiovascular complication will be avoided Outcome: Progressing   Problem: Activity: Goal: Risk for activity intolerance will decrease Outcome: Progressing   Problem: Skin Integrity: Goal: Risk for impaired skin integrity will decrease Outcome: Progressing   Problem: Safety: Goal: Ability to remain free from injury will improve Outcome: Progressing   Problem: Activity: Goal: Ability to tolerate increased activity will improve Outcome: Progressing

## 2021-10-30 NOTE — Progress Notes (Signed)
      [address]       [place] [address]             [phone]       3 Days Post-Op Procedure(s) (LRB): VIDEO ASSISTED THORACOSCOPY (VATS)/DECORTICATION (Right)  Subjective: Awake and alert. He has been walking around, denies shortness of breath. He is eager to return home.    Objective: Vital signs in last 24 hours: Temp:  [98.8 F (37.1 C)-99.1 F (37.3 C)] 99.1 F (37.3 C) (09/02 0741) Pulse Rate:  [81-90] 85 (09/02 0741) Cardiac Rhythm: Normal sinus rhythm (09/02 0700) Resp:  [12-20] 12 (09/02 0741) BP: (131-144)/(74-93) 131/93 (09/02 0741) SpO2:  [91 %-96 %] 95 % (09/02 0741) Weight:  [123.6 kg] 123.6 kg (09/02 0418)     Intake/Output from previous day: 09/01 0701 - 09/02 0700 In: 120 [P.O.:120] Out: 30 [Chest Tube:30]   Physical Exam:  Cardiovascular: RRR Pulmonary: Strong respiratory effort, no air leak from the chest tube and minimal drainage past 24 hours.  The CXR is stable with persistent density in the right lower lung zone, no PTX.  Breath sounds are diminished on the right, clear on the left.  Wounds: right chest port sites are clean and dry.   Chest Tube: to suction, no air leak  Lab Results: CBC: Recent Labs    10/29/21 0437 10/30/21 0205  WBC 21.0* 17.1*  HGB 10.6* 10.1*  HCT 33.4* 31.4*  PLT 1,065* 1,027*    BMET:  Recent Labs    10/29/21 0437 10/30/21 0205  NA 136 139  K 4.0 4.1  CL 101 105  CO2 26 26  GLUCOSE 95 96  BUN 12 13  CREATININE 1.35* 1.13  CALCIUM 8.6* 8.6*     Assessment/Plan:  -POD3 right VATS, decortication for empyema. Minimal CT drainage and no PTX. WBC trending down. Cultures from OR showed no growth at 24 hours, Gm Stain showing no organisms. ABX mgt per the medicine team. Will remove the chest tube. OK to discharge from CT surgery standpoint. Follow up arranged with Dr. Cliffton Asters for 9/15.   Dale Herriman 10/30/2021,9:01 AM [phone]

## 2021-10-30 NOTE — Discharge Summary (Signed)
Todd Pena ZOX:[id number] DOB: 1985-11-03 DOA: 10/16/2021  PCP: Patient, No Pcp Per  Admit date: 10/16/2021  Discharge date: 10/30/2021  Admitted From: Home   Disposition:  Home   Recommendations for Outpatient Follow-up:   Follow up with PCP in 1-2 weeks  PCP Please obtain BMP/CBC, 2 view CXR in 1week,  (see Discharge instructions)   PCP Please follow up on the following pending results:    Home Health: None   Equipment/Devices: None  Consultations: CTVS Discharge Condition: Stable    CODE STATUS: Full    Diet Recommendation: Heart Healthy     Chief Complaint  Patient presents with   Shortness of Breath     Brief history of present illness from the day of admission and additional interim summary    36 y.o. male with PMH significant for morbid obesity, hypertension 8/16, patient presented to the ED with complaint of cough, chest pain.  Chest x-ray showed right lower lobe pneumonia and he was discharged home with a course of doxycycline.  Despite compliance to the antibiotic, patient did not improve and he returned to [facility] long ED on 10/16/21 with shortness of breath and cough, was diagnosed with right-sided empyema along with acute hypoxic respiratory failure, he was seen by pulmonary critical care, he underwent ultrasound-guided thoracentesis followed by Victanyl chest tube catheter placed by IR however continued to have signs of ongoing infection and empyema.  His case was discussed by pulmonary critical care physician at [facility] with cardiothoracic surgeon Dr. Cliffton Asters for possible VATS procedure and he was transferred to [facility] under my care on 10/27/2021 for further care.     8/16 ER assessment >> dx with CAP and d/c home with doxycycline 8/19 Admit, start IV ABX. CT angio chest 8/19  >> moderate loculated Rt pleural effusion, trace Lt pleural effusion, extensive consolidation and ATX especially at Rt lung base 8/20 bedside u/s >> only small fluid pocket 8/21 IR assessment >> only small fluid pocket 8/23 progression of Rt pleural effusion  Echocardiogram.  EF 60%.  No acute changes 8/24 IR reconsulted >> 12 F Rt pigtail chest tube placed Rt pleural fluid 8/24 >> glucose 48, protein 5.3, LDH 1328, WBC 637 (93% neutrophils) - exudative  8/25 start pleurolytic therapy 8/26 2nd pleurolytic therapy 8/27 off O2 8/28 repeat CT> mostly unchanged, 3rd pleurolytic therapy, still fevering 101.1 10/27/2021 VATS procedure with decortication and right-sided chest tube placement by Dr. Cliffton Asters Chest tube removal by Dr. Cliffton Asters on 10/30/2021 cleared for discharge                                                                 Hospital Course    Sepsis present on admission due to recurrent pneumonia and right-sided empyema. He has been seen by pulmonary critical care  at [facility] long hospital underwent ultrasound-guided thoracentesis followed by chest tube placement on 10/21/2021 however empyema persisted, his empiric IV antibiotics were continued, sepsis pathophysiology has resolved.  He was seen by both pulmonary and cardiothoracic surgery, he underwent Right video assisted thoracoscopy, Decortication with right-sided chest tube placement by Dr. Cliffton Asters on 10/27/2021.  His intraoperative cultures remain negative including blood cultures, he remained stable on empiric IV cefepime.  Chest tube finally removed on 10/30/2021, overall symptom-free cleared by cardiothoracic surgery for discharge will place him on 10 more days of oral Augmentin with outpatient PCP and follow-up with Dr. Cliffton Asters.    Hemoptysis - Related to pneumonia, now clearly resolved.   Acute on chronic thrombocytosis - Baseline lately count several years ago 600 now elevated due to empyema, peripheral smear was stable, continue  to monitor closely, with supportive care trend is coming down, request PCP to recheck CBC with peripheral smear in 7 to 10 days.  Please obtain outpatient hematology follow-up as well.    Acute metabolic encephalopathy, POA - Patient was confused at the time of admission due to sepsis now resolved.   Essential hypertension, poorly controlled - Blood pressure currently remains controlled on Coreg 25 mg twice daily, amlodipine to 10 mg p.o. daily with good control follow-up with PCP 1 month supply provided.   Morbid obesity  -Body mass index is 39.7 kg/m.  With PCP postdischarge for weight loss.   Discharge diagnosis     Principal Problem:   Multifocal pneumonia Active Problems:   Hypertension   Loculated right pleural effusion    Discharge instructions    Discharge Instructions     Diet - low sodium heart healthy   Complete by: As directed    Discharge instructions   Complete by: As directed    Follow with Primary MD in 7 days   Get CBC, CMP, Magnesium, 2 view Chest X ray -  checked next visit within 1 week by Primary MD    Activity: As tolerated with Full fall precautions use walker/cane & assistance as needed  Disposition Home    Diet: Heart Healthy    Special Instructions: If you have smoked or chewed Tobacco  in the last 2 yrs please stop smoking, stop any regular Alcohol  and or any Recreational drug use.  On your next visit with your primary care physician please Get Medicines reviewed and adjusted.  Please request your Prim.MD to go over all Hospital Tests and Procedure/Radiological results at the follow up, please get all Hospital records sent to your Prim MD by signing hospital release before you go home.  If you experience worsening of your admission symptoms, develop shortness of breath, life threatening emergency, suicidal or homicidal thoughts you must seek medical attention immediately by calling 911 or calling your MD immediately  if symptoms less  severe.  You Must read complete instructions/literature along with all the possible adverse reactions/side effects for all the Medicines you take and that have been prescribed to you. Take any new Medicines after you have completely understood and accpet all the possible adverse reactions/side effects.   Increase activity slowly   Complete by: As directed    No dressing needed   Complete by: As directed        Discharge Medications   Allergies as of 10/30/2021   No Known Allergies      Medication List     TAKE these medications    acetaminophen 325 MG tablet Commonly known as: TYLENOL Take 2  tablets (650 mg total) by mouth every 6 (six) hours as needed for mild pain, fever or headache.   amLODipine 10 MG tablet Commonly known as: NORVASC Take 1 tablet (10 mg total) by mouth daily. What changed:  medication strength how much to take   amoxicillin-clavulanate 875-125 MG tablet Commonly known as: AUGMENTIN Take 1 tablet by mouth 2 (two) times daily.   carvedilol 25 MG tablet Commonly known as: COREG Take 1 tablet (25 mg total) by mouth 2 (two) times daily with a meal.   oxyCODONE 5 MG immediate release tablet Commonly known as: Oxy IR/ROXICODONE Take 1 tablet (5 mg total) by mouth every 6 (six) hours as needed for severe pain.   senna-docusate 8.6-50 MG tablet Commonly known as: Senokot-S Take 1 tablet by mouth at bedtime as needed for mild constipation or moderate constipation.               Discharge Care Instructions  (From admission, onward)           Start     Ordered   10/30/21 0000  No dressing needed        10/30/21 1104             Follow-up Information     Corliss Skains, MD. Go on 11/12/2021.   Specialty: Cardiothoracic Surgery Why: PA/LAT CXR to be taken (at [facility] which is in the same building as Dr. Lucilla Lame office) on 09/15 at 9:40 am;Appointment time is at 10:10 am Contact information: [address] [place] [address] [phone]         [facility]. Schedule an appointment as soon as possible for a visit in 1 week(s).   Contact information: [address] [place] [address] [phone]                Major procedures and Radiology Reports - PLEASE review detailed and final reports thoroughly  -        DG CHEST PORT 1 VIEW  Result Date: 10/30/2021 CLINICAL DATA:  Follow-up study.  Pneumothorax and right chest tube. EXAM: PORTABLE CHEST 1 VIEW COMPARISON:  10/29/2021 and prior studies. FINDINGS: No convincing pneumothorax. Right chest tube is stable. Persistent opacities in the right mid and lower lung, obscuring the right hemidiaphragm, consistent with a combination of atelectasis and pleural fluid. Left lung remains clear. IMPRESSION: 1. No pneumothorax evident on the current study. 2. Stable right chest tube. 3. Persistent right lung base opacities consistent with a combination of pleural fluid and atelectasis. Electronically Signed   By: Amie Portland M.D.   On: 10/30/2021 09:17   DG CHEST PORT 1 VIEW  Result Date: 10/29/2021 CLINICAL DATA:  Shortness of breath and chest pain. Chest tube. Evaluate pigtail drainage catheter. Evaluate for pneumothorax. Pleural effusion. EXAM: PORTABLE CHEST 1 VIEW COMPARISON:  AP chest 10/28/2021 CT chest 10/25/2021 FINDINGS: Redemonstration of right-sided chest tube with tip overlying the superior right hemithorax. Mild subcutaneous air is again seen along the inferolateral right hemithorax. Cardiac silhouette and mediastinal contours are within normal limits. There is mild-to-moderate elevation of the right hemidiaphragm, unchanged. No significant change in inferolateral right pleural thickening or pleural fluid within the right minor fissure and heterogeneous right basilar airspace opacification. The left lung is clear. Possible minimal extrapleural air measuring only  up to 2 mm in craniocaudal thickness at the superolateral aspect of the right hemithorax appears unchanged from prior 10/28/2021 at  7:34 a.m. radiograph. No acute skeletal abnormality. IMPRESSION: 1. No significant change from prior. 2. Right-sided chest tube is unchanged in position. Possible minimal 2 mm right apical pneumothorax appears not significantly changed prior 10/28/2021 at 7:34 a.m. radiograph. 3. Right basilar atelectasis, pleural thickening, and fluid within the right minor fissure appear unchanged. Electronically Signed   By: Neita Garnet M.D.   On: 10/29/2021 08:12   DG Chest Port 1 View  Result Date: 10/28/2021 CLINICAL DATA:  Chest tube present.  Pleural effusion. EXAM: PORTABLE CHEST 1 VIEW COMPARISON:  AP chest 10/26/2021 and CT chest 10/25/2021 FINDINGS: Interval removal of the prior right lower hemithorax pigtail drainage catheter. Interval placement of new larger caliber right-sided chest tube with tip overlying the superior right hemithorax. Cardiac silhouette and mediastinal contours are within normal limits. Mildly decreased lung volumes. There is slightly improved aeration of the right lung base with persistent predominantly horizontal linear opacities including the fluid previously seen within the right minor fissure on prior CT. There is again right inferolateral pleural thickening. The left lung is clear. No pleural effusion or pneumothorax. Mild subcutaneous air lateral to the lower right ribs. No acute skeletal abnormality. IMPRESSION: 1. Interval removal of prior right basilar pigtail drainage catheter interval placement of new larger caliber right-sided chest tube. No pneumothorax. 2. Persistent linear opacities and pleural thickening within the right lower hemithorax, a combination of pleural effusion and airspace opacities. Note is made of concern for loculated pleural effusion on recent CT. Electronically Signed   By: Neita Garnet M.D.   On: 10/28/2021 08:18   DG Chest  Port 1 View  Result Date: 10/26/2021 CLINICAL DATA:  Empyema EXAM: PORTABLE CHEST 1 VIEW COMPARISON:  10/25/2021, 10/24/2021 FINDINGS: Right basilar chest tube remains in place. Persistent small-moderate right pleural effusion with associated right basilar opacity. Heart size is normal. Left lung remains clear. No pneumothorax IMPRESSION: Persistent small-moderate right pleural effusion with associated right basilar opacity. Right chest tube remains in place. Electronically Signed   By: Duanne Guess D.O.   On: 10/26/2021 11:38   CT CHEST WO CONTRAST  Result Date: 10/25/2021 CLINICAL DATA:  Empyema. EXAM: CT CHEST WITHOUT CONTRAST TECHNIQUE: Multidetector CT imaging of the chest was performed following the standard protocol without IV contrast. RADIATION DOSE REDUCTION: This exam was performed according to the departmental dose-optimization program which includes automated exposure control, adjustment of the mA and/or kV according to patient size and/or use of iterative reconstruction technique. COMPARISON:  October 16, 2021. FINDINGS: Cardiovascular: No significant vascular findings. Normal heart size. No pericardial effusion. Mediastinum/Nodes: No enlarged mediastinal or axillary lymph nodes. Thyroid gland, trachea, and esophagus demonstrate no significant findings. Lungs/Pleura: No pneumothorax is noted. Left lung is clear. There is been interval placement of pigtail drainage catheter into the right lung base. There is continued presence of loculated pleural effusion with associated atelectasis or pneumonia as noted on prior exam, consistent with empyema. This is not significantly changed compared to prior exam. Upper Abdomen: No acute abnormality. Musculoskeletal: No chest wall mass or suspicious bone lesions identified. IMPRESSION: Interval placement of pigtail drainage catheter in the right lung base. Grossly stable loculated right pleural effusion is noted with associated right basilar atelectasis or  infiltrate, consistent with empyema as noted on prior exam. Electronically Signed   By: Lupita Raider M.D.   On: 10/25/2021 10:50   CT [facility] PLEURAL DRAIN W/INDWELL CATH W/IMG GUIDE  Result Date: 10/25/2021 INDICATION: 36 year old male with loculated pleural effusion concerning for empyema.  EXAM: CT-guided chest tube placement TECHNIQUE: Multidetector CT imaging of the chest was performed following the standard protocol without IV contrast. RADIATION DOSE REDUCTION: This exam was performed according to the departmental dose-optimization program which includes automated exposure control, adjustment of the mA and/or kV according to patient size and/or use of iterative reconstruction technique. MEDICATIONS: The patient is currently admitted to the hospital and receiving intravenous antibiotics. The antibiotics were administered within an appropriate time frame prior to the initiation of the procedure. ANESTHESIA/SEDATION: Moderate (conscious) sedation was employed during this procedure. A total of Versed 2 mg and Fentanyl 150 mcg was administered intravenously by the radiology nurse. Total intra-service moderate Sedation Time: 20 minutes. The patient's level of consciousness and vital signs were monitored continuously by radiology nursing throughout the procedure under my direct supervision. COMPLICATIONS: None immediate. PROCEDURE: Informed written consent was obtained from the patient after a thorough discussion of the procedural risks, benefits and alternatives. All questions were addressed. Maximal Sterile Barrier Technique was utilized including caps, mask, sterile gowns, sterile gloves, sterile drape, hand hygiene and skin antiseptic. A timeout was performed prior to the initiation of the procedure. Axial CT imaging demonstrates a highly loculated right-sided pleural effusion. A suitable skin entry site was identified at the level of the nipple in the mid axillary line. The skin was marked and then prepped  and draped in the standard fashion using chlorhexidine skin prep. Local anesthesia was attained by infiltration with 1% lidocaine. Under intermittent CT guidance, an 18 gauge trocar needle was advanced over the rib and into the pleural space. A 0.035 wire was then advanced into the pleural space. The percutaneous tract was dilated to 12 [place]. A Cook 12 [place] all-purpose drainage catheter modified with additional sideholes was advanced over the wire and into the pleural space. The drainage catheter was connected to an atrium and low wall suction. The catheter was secured to the skin with 0 Prolene suture. Follow-up CT imaging demonstrates a well-positioned drainage catheter. Initial drainage through the tube was turbid straw-colored fluid. IMPRESSION: Successful placement of 12 French right-sided percutaneous chest tube using CT guidance. Electronically Signed   By: Malachy MoanHeath  McCullough M.D.   On: 10/25/2021 09:36   DG Chest Port 1 View  Result Date: 10/24/2021 CLINICAL DATA:  Pneumonia. EXAM: PORTABLE CHEST 1 VIEW COMPARISON:  October 23 2021 FINDINGS: The right chest tube is stable. No pneumothorax. The left lung is clear. Opacity remains in the right base with probable associated small effusion. Stable cardiomediastinal silhouette. IMPRESSION: 1. Stable chest tube. 2. Small right pleural effusion.  Right basilar opacity, stable. 3. No other interval changes. Electronically Signed   By: Gerome Samavid  Williams III M.D.   On: 10/24/2021 07:58   DG Chest Port 1 View  Result Date: 10/23/2021 CLINICAL DATA:  Pneumonia EXAM: PORTABLE CHEST 1 VIEW COMPARISON:  October 21, 2021 FINDINGS: The right chest tube remains, advanced slightly in the interval. A right-sided pleural effusion remains but is smaller. Persistent opacity in the right mid lower lung. The left lung is clear. The cardiomediastinal silhouette is unchanged. No pneumothorax. IMPRESSION: 1. The right chest tube remains in place, advanced slightly in the  interval. 2. Persistent but smaller right pleural effusion. 3. Persistent opacity in the right mid lower lung. 4. No other changes. Electronically Signed   By: Gerome Samavid  Williams III M.D.   On: 10/23/2021 08:29   DG CHEST PORT 1 VIEW  Result Date: 10/21/2021 CLINICAL DATA:  Right pleural effusion EXAM: PORTABLE CHEST 1 VIEW  COMPARISON:  10/20/2021 FINDINGS: Right basilar pigtail chest tube has been placed. Large right pleural effusion persists, essentially unchanged from prior examination, with collapse of the right middle and lower lobes and compressive atelectasis of the right upper lobe. Mild left basilar atelectasis or infiltrate persists. No pneumothorax. Cardiac size within normal limits. Pulmonary vascularity is normal. IMPRESSION: Interval placement of right basilar pigtail chest tube. Persistent large right pleural effusion with collapse of the right middle and lower lobes and compressive atelectasis of the right upper lobe. Electronically Signed   By: Helyn Numbers M.D.   On: 10/21/2021 20:11   VAS [place] LOWER EXTREMITY VENOUS (DVT)  Result Date: 10/20/2021  Lower Venous DVT Study Patient Name:  MARGUIS MATHIESON [facility]  Date of Exam:   10/20/2021 Medical Rec #: [id number]        Accession #:    [id number] Date of Birth: Apr 10, 1985        Patient Gender: M Patient Age:   52 years Exam Location:  [facility] Procedure:      VAS [place] LOWER EXTREMITY VENOUS (DVT) Referring Phys: Lorin Glass --------------------------------------------------------------------------------  Indications: Swelling.  Risk Factors: None identified. Limitations: Poor ultrasound/tissue interface. Comparison Study: No prior studies. Performing Technologist: Chanda Busing RVT  Examination Guidelines: A complete evaluation includes B-mode imaging, spectral Doppler, color Doppler, and power Doppler as needed of all accessible portions of each vessel. Bilateral testing is considered an integral part of a complete examination. Limited  examinations for reoccurring indications may be performed as noted. The reflux portion of the exam is performed with the patient in reverse Trendelenburg.  +---------+---------------+---------+-----------+----------+--------------+ RIGHT    CompressibilityPhasicitySpontaneityPropertiesThrombus Aging +---------+---------------+---------+-----------+----------+--------------+ CFV      Full           Yes      Yes                                 +---------+---------------+---------+-----------+----------+--------------+ SFJ      Full                                                        +---------+---------------+---------+-----------+----------+--------------+ FV Prox  Full                                                        +---------+---------------+---------+-----------+----------+--------------+ FV Mid   Full                                                        +---------+---------------+---------+-----------+----------+--------------+ FV DistalFull                                                        +---------+---------------+---------+-----------+----------+--------------+ PFV      Full                                                        +---------+---------------+---------+-----------+----------+--------------+  POP      Full           Yes      Yes                                 +---------+---------------+---------+-----------+----------+--------------+ PTV      Full                                                        +---------+---------------+---------+-----------+----------+--------------+ PERO     Full                                                        +---------+---------------+---------+-----------+----------+--------------+   +---------+---------------+---------+-----------+----------+--------------+ LEFT     CompressibilityPhasicitySpontaneityPropertiesThrombus Aging  +---------+---------------+---------+-----------+----------+--------------+ CFV      Full           Yes      Yes                                 +---------+---------------+---------+-----------+----------+--------------+ SFJ      Full                                                        +---------+---------------+---------+-----------+----------+--------------+ FV Prox  Full                                                        +---------+---------------+---------+-----------+----------+--------------+ FV Mid   Full                                                        +---------+---------------+---------+-----------+----------+--------------+ FV Distal               Yes      Yes                                 +---------+---------------+---------+-----------+----------+--------------+ PFV      Full                                                        +---------+---------------+---------+-----------+----------+--------------+ POP      Full           Yes      Yes                                 +---------+---------------+---------+-----------+----------+--------------+  PTV      Full                                                        +---------+---------------+---------+-----------+----------+--------------+ PERO     Full                                                        +---------+---------------+---------+-----------+----------+--------------+     Summary: RIGHT: - There is no evidence of deep vein thrombosis in the lower extremity. However, portions of this examination were limited- see technologist comments above.  - No cystic structure found in the popliteal fossa.  LEFT: - There is no evidence of deep vein thrombosis in the lower extremity. However, portions of this examination were limited- see technologist comments above.  - No cystic structure found in the popliteal fossa.  *See table(s) above for measurements and observations.  Electronically signed by Coral Else MD on 10/20/2021 at 8:27:50 PM.    Final    ECHOCARDIOGRAM COMPLETE  Result Date: 10/20/2021    ECHOCARDIOGRAM REPORT   Patient Name:   DAMMON MAKAREWICZ [facility] Date of Exam: 10/20/2021 Medical Rec #:  [id number]       Height:       71.0 in Accession #:    [id number]      Weight:       291.0 lb Date of Birth:  Feb 19, 1986       BSA:          2.473 m Patient Age:    35 years        BP:           138/98 mmHg Patient Gender: M               HR:           104 bpm. Exam Location:  Inpatient Procedure: 2D Echo, Cardiac Doppler and Color Doppler Indications:    Abnormal ECG  History:        Patient has no prior history of Echocardiogram examinations.                 Risk Factors:Hypertension.  Sonographer:    Gaynell Face Referring Phys: [id number] BINAYA DAHAL IMPRESSIONS  1. Left ventricular ejection fraction, by estimation, is 60 to 65%. The left ventricle has normal function. The left ventricle has no regional wall motion abnormalities. There is moderate left ventricular hypertrophy. Left ventricular diastolic parameters were normal.  2. Right ventricular systolic function is normal. The right ventricular size is normal.  3. The mitral valve is normal in structure. Trivial mitral valve regurgitation.  4. The aortic valve is tricuspid. Aortic valve regurgitation is not visualized.  5. Aortic dilatation noted. There is moderate dilatation of the aortic root, measuring 44 mm.  6. The inferior vena cava is dilated in size with >50% respiratory variability, suggesting right atrial pressure of 8 mmHg. FINDINGS  Left Ventricle: Left ventricular ejection fraction, by estimation, is 60 to 65%. The left ventricle has normal function. The left ventricle has no regional wall motion abnormalities. The left ventricular internal cavity size was normal in size. There is  moderate left ventricular hypertrophy.  Left ventricular diastolic parameters were normal. Right Ventricle: The right ventricular size is  normal. Right vetricular wall thickness was not assessed. Right ventricular systolic function is normal. Left Atrium: Left atrial size was normal in size. Right Atrium: Right atrial size was normal in size. Pericardium: There is no evidence of pericardial effusion. Mitral Valve: The mitral valve is normal in structure. Trivial mitral valve regurgitation. Tricuspid Valve: The tricuspid valve is normal in structure. Tricuspid valve regurgitation is trivial. Aortic Valve: The aortic valve is tricuspid. Aortic valve regurgitation is not visualized. Aortic valve mean gradient measures 2.0 mmHg. Aortic valve peak gradient measures 4.5 mmHg. Aortic valve area, by VTI measures 4.56 cm. Pulmonic Valve: The pulmonic valve was normal in structure. Pulmonic valve regurgitation is not visualized. Aorta: Aortic dilatation noted. There is moderate dilatation of the aortic root, measuring 44 mm. Venous: The inferior vena cava is dilated in size with greater than 50% respiratory variability, suggesting right atrial pressure of 8 mmHg. IAS/Shunts: No atrial level shunt detected by color flow Doppler.  LEFT VENTRICLE PLAX 2D LVIDd:         5.00 cm   Diastology LVIDs:         3.20 cm   LV e' medial:    9.36 cm/s LV PW:         1.30 cm   LV E/e' medial:  7.3 LV IVS:        1.60 cm   LV e' lateral:   13.50 cm/s LVOT diam:     2.60 cm   LV E/e' lateral: 5.1 LV SV:         71 LV SV Index:   29 LVOT Area:     5.31 cm  RIGHT VENTRICLE RV S prime:     14.30 cm/s TAPSE (M-mode): 2.0 cm LEFT ATRIUM             Index        RIGHT ATRIUM           Index LA Vol (A2C):   53.0 ml 21.43 ml/m  RA Area:     14.30 cm LA Vol (A4C):   61.1 ml 24.71 ml/m  RA Volume:   38.40 ml  15.53 ml/m LA Biplane Vol: 57.1 ml 23.09 ml/m  AORTIC VALVE AV Area (Vmax):    4.22 cm AV Area (Vmean):   4.20 cm AV Area (VTI):     4.56 cm AV Vmax:           106.00 cm/s AV Vmean:          71.900 cm/s AV VTI:            0.155 m AV Peak Grad:      4.5 mmHg AV Mean Grad:       2.0 mmHg LVOT Vmax:         84.20 cm/s LVOT Vmean:        56.900 cm/s LVOT VTI:          0.133 m LVOT/AV VTI ratio: 0.86  AORTA Ao Root diam: 4.40 cm Ao Asc diam:  3.40 cm MITRAL VALVE MV Area (PHT): 4.86 cm    SHUNTS MV Decel Time: 156 msec    Systemic VTI:  0.13 m MV E velocity: 68.40 cm/s  Systemic Diam: 2.60 cm MV A velocity: 48.10 cm/s MV E/A ratio:  1.42 Dietrich Pates MD Electronically signed by Dietrich Pates MD Signature Date/Time: 10/20/2021/3:57:18 PM    Final    DG Chest  2 View  Result Date: 10/20/2021 CLINICAL DATA:  Pneumonia EXAM: CHEST - 2 VIEW COMPARISON:  10/16/2021 FINDINGS: Heart size within normal limits. Large right pleural effusion, increased from prior. Progressive bibasilar airspace opacities. No pneumothorax. IMPRESSION: 1. Large right pleural effusion, increased from prior. 2. Progressive bibasilar airspace opacities. Electronically Signed   By: Duanne Guess D.O.   On: 10/20/2021 15:21   [place] CHEST (PLEURAL EFFUSION)  Result Date: 10/18/2021 CLINICAL DATA:  36 year old male with right-sided pleural effusion EXAM: CHEST ULTRASOUND COMPARISON:  None Available. FINDINGS: Highly complicated/loculated but small right-sided pleural effusion. The due to the internal septations, the collapse portion of the right lower lobe is held close to the pleural surface. There is not sufficient space for safe thoracentesis. IMPRESSION: Small a highly complex and loculated right pleural effusion. There is no safe window for thoracentesis. Electronically Signed   By: Malachy Moan M.D.   On: 10/18/2021 13:17   CT Angio Chest PE W and/or Wo Contrast  Result Date: 10/16/2021 CLINICAL DATA:  Pulmonary embolism suspected, recent diagnosis of pneumonia EXAM: CT ANGIOGRAPHY CHEST WITH CONTRAST TECHNIQUE: Multidetector CT imaging of the chest was performed using the standard protocol during bolus administration of intravenous contrast. Multiplanar CT image reconstructions and MIPs were obtained to  evaluate the vascular anatomy. RADIATION DOSE REDUCTION: This exam was performed according to the departmental dose-optimization program which includes automated exposure control, adjustment of the mA and/or kV according to patient size and/or use of iterative reconstruction technique. CONTRAST:  OMNIPAQUE IOHEXOL 350 MG/ML SOLN COMPARISON:  None Available. FINDINGS: Cardiovascular: Examination for pulmonary embolism is somewhat limited by marginal contrast bolus and breath motion artifact, particularly in the lung bases. Within this limitation, no evidence of pulmonary embolism through the proximal segmental pulmonary arterial level. Normal heart size. No pericardial effusion. Mediastinum/Nodes: No enlarged mediastinal, hilar, or axillary lymph nodes. Thyroid gland, trachea, and esophagus demonstrate no significant findings. Lungs/Pleura: Moderate, loculated appearing right pleural effusion and trace left pleural effusion. Extensive associated atelectasis or consolidation, particularly of the right lung base, with a somewhat more focal opacity of the superior segment right lower lobe (series 5, image 42). Upper Abdomen: No acute abnormality. Musculoskeletal: No chest wall abnormality. No acute osseous findings. Review of the MIP images confirms the above findings. IMPRESSION: 1. Examination for pulmonary embolism is somewhat limited by marginal contrast bolus and breath motion artifact, particularly in the lung bases. Within this limitation, no evidence of pulmonary embolism through the proximal segmental pulmonary arterial level. 2. Moderate, loculated appearing right pleural effusion and trace left pleural effusion. Extensive associated atelectasis or consolidation, particularly of the right lung base, with a somewhat more focal opacity of the superior segment right lower lobe. Findings are consistent with multifocal infection and/or aspiration. Electronically Signed   By: Jearld Lesch M.D.   On:  10/16/2021 13:12   DG Chest 2 View  Result Date: 10/16/2021 CLINICAL DATA:  Progressing pneumonia symptoms. EXAM: CHEST - 2 VIEW COMPARISON:  10/13/2021 and older studies. FINDINGS: Cardiac silhouette is normal in size. No mediastinal or hilar masses or evidence of adenopathy. Lung volumes are low. There is bilateral lung base opacity mostly obscuring the left and obscuring the right hemidiaphragms, similar on the left but increased on the right compared to the prior exam. Mid to upper lungs remain clear. No convincing pleural effusion.  No pneumothorax. Skeletal structures are intact. IMPRESSION: 1. Bilateral lung base opacities, increased on the right and stable on the left compared to the most  recent prior chest radiograph, consistent with pneumonia, atelectasis or a combination. Findings are accentuated by low lung volumes. Electronically Signed   By: Amie Portland M.D.   On: 10/16/2021 09:35   DG Chest 2 View  Result Date: 10/13/2021 CLINICAL DATA:  Cough. EXAM: CHEST - 2 VIEW COMPARISON:  Chest x-ray 03/06/2018 FINDINGS: There is patchy airspace disease in the left lower lobe. There is some minimal atelectasis in the right mid lung. Lung volumes are low. No pleural effusion or pneumothorax. Cardiomediastinal silhouette within normal limits. No acute fractures. IMPRESSION: Left lower lobe airspace disease worrisome for pneumonia. Follow-up chest x-ray recommended in 4-6 weeks to confirm resolution. Low lung volumes. Electronically Signed   By: Darliss Cheney M.D.   On: 10/13/2021 21:07    Micro Results    Recent Results (from the past 240 hour(s))  Acid Fast Smear (AFB)     Status: None   Collection Time: 10/21/21  6:38 PM   Specimen: Pleural; Body Fluid  Result Value Ref Range Status   AFB Specimen Processing Concentration  Final   Acid Fast Smear Negative  Final    Comment: (NOTE) Performed At: [facility] [address] [place], [place] [id number] Jolene Schimke MD  DQ:[id number]    Source (AFB) PLEURAL  Final    Comment: Performed at [facility], [address] [place], [place] [address]  Body fluid culture w Gram Stain     Status: None   Collection Time: 10/21/21  6:38 PM   Specimen: Pleura; Body Fluid  Result Value Ref Range Status   Specimen Description   Final    PLEURAL Performed at [facility], [address], [place], [place] [address]    Special Requests   Final    NONE Performed at [facility], [address] [place], [place] [address]    Gram Stain   Final    WBC PRESENT, PREDOMINANTLY PMN NO ORGANISMS SEEN CYTOSPIN SMEAR    Culture   Final    NO GROWTH 3 DAYS Performed at [facility], [address] [place], [place] [address]    Report Status 10/25/2021 FINAL  Final  Aerobic/Anaerobic Culture w Gram Stain (surgical/deep wound)     Status: None (Preliminary result)   Collection Time: 10/27/21  4:15 PM   Specimen: PATH Soft tissue resection  Result Value Ref Range Status   Specimen Description PLEURAL  Final   Special Requests NONE  Final   Gram Stain   Final    RARE WBC PRESENT,BOTH PMN AND MONONUCLEAR NO ORGANISMS SEEN    Culture   Final    NO GROWTH < 24 HOURS Performed at [facility], [address] [place], [place] [address]    Report Status PENDING  Incomplete  Acid Fast Smear (AFB)     Status: None   Collection Time: 10/27/21  4:15 PM   Specimen: PATH Soft tissue resection  Result Value Ref Range Status   AFB Specimen Processing Concentration  Final   Acid Fast Smear Negative  Final    Comment: (NOTE) Performed At: [facility] [address] [place], [place] [id number] Jolene Schimke MD XA:[id number]    Source (AFB) PLEURAL  Final    Comment: Performed at [facility], [address] [place], [place] [address]    Today   Subjective    Dakarai Sellen today has no headache,no chest abdominal pain,no new weakness  tingling or numbness,  feels much better wants to go home today.    Objective   Blood pressure (!) 131/93, pulse 85, temperature 99.1 F (37.3 C), temperature source Oral, resp. rate 12, height  (1.803 m), weight 123.6 kg, SpO2 95 %.   Intake/Output Summary (Last 24 hours) at 10/30/2021 1104 Last data filed at 10/30/2021 0500 Gross per 24 hour  Intake 120 ml  Output 30 ml  Net 90 ml    Exam  Awake Alert, No new F.N deficits,    [place].AT,PERRAL Supple Neck,   Symmetrical Chest wall movement, Good air movement bilaterally, CTAB RRR,No Gallops,   +ve B.Sounds, Abd Soft, Non tender,  No Cyanosis, Clubbing or edema    Data Review   Recent Labs  Lab 10/24/21 1103 10/27/21 0659 10/28/21 0247 10/29/21 0437 10/30/21 0205  WBC 20.4* 15.2* 25.0* 21.0* 17.1*  HGB 11.7* 10.4* 11.1* 10.6* 10.1*  HCT 36.3* 31.8* 34.9* 33.4* 31.4*  PLT 927* 998* 1,114* 1,065* 1,027*  MCV 88.8 88.1 89.7 90.3 89.0  MCH 28.6 28.8 28.5 28.6 28.6  MCHC 32.2 32.7 31.8 31.7 32.2  RDW 13.1 13.2 13.3 13.7 13.8  LYMPHSABS  --   --  1.9 2.6 2.5  MONOABS  --   --  1.4* 2.0* 1.5*  EOSABS  --   --  0.0 0.1 0.2  BASOSABS  --   --  0.1 0.2* 0.1    Recent Labs  Lab 10/25/21 0440 10/27/21 0659 10/28/21 0247 10/29/21 0437 10/30/21 0205  NA 137 139 137 136 139  K 4.7 4.1 5.0 4.0 4.1  CL 103 102 105 101 105  CO2 GLUCOSE 95 102* 128* 95 96  BUN 8 <5* CREATININE 0.94 1.09 1.07 1.35* 1.13  CALCIUM 8.5* 8.8* 8.9 8.6* 8.6*  AST  --  31 35 26 19  ALT  --  33 36 34 28  ALKPHOS  --  46 56 52 55  BILITOT  --  0.7 0.5 0.8 0.7  ALBUMIN  --  2.4* 2.6* 2.4* 2.4*  MG  --  2.1 2.0 2.1 2.1  CRP  --  3.8* 3.0* 5.8* 5.3*  PROCALCITON  --  <0.10 <0.10 <0.10 <0.10  INR  --  1.1  --   --   --   BNP  --  42.4 63.4 61.4 60.9    Total Time in preparing paper work, data evaluation and todays exam - 35 minutes  Susa Raring M.D on 10/30/2021 at 11:04 AM  [facility]

## 2021-11-02 LAB — AEROBIC/ANAEROBIC CULTURE W GRAM STAIN (SURGICAL/DEEP WOUND): Culture: NO GROWTH

## 2021-11-03 LAB — MISC LABCORP TEST (SEND OUT): Labcorp test code: 9985

## 2021-11-11 NOTE — Progress Notes (Unsigned)
      [address]       [place] [address]             [phone]        Todd Pena [facility] Medical Record #[id number] Date of Birth: [birth year]  Referring: No ref. provider found Primary Care: Patient, No Pcp Per Primary Cardiologist:None  Reason for visit:   follow-up  History of Present Illness:     36 year old male presents for his first follow-up appointment after undergoing a VATS decortication.  He occasionally has some shortness of breath, and sharp back pain.  Physical Exam: BP (!) 150/88   Pulse 77   Resp 20   Ht 5\' 11"  (1.803 m)   Wt 263 lb (119.3 kg)   SpO2 95%   BMI 36.68 kg/m   Alert NAD Incision clean.   Abdomen, ND No peripheral edema     Assessment / Plan:   36 year old male status post VATS decortication for loculated empyema.  Follow-up 1 month with a chest x-ray.     54 11/12/2021 11:31 AM

## 2021-11-12 ENCOUNTER — Ambulatory Visit (INDEPENDENT_AMBULATORY_CARE_PROVIDER_SITE_OTHER): Payer: Self-pay | Admitting: Thoracic Surgery (Cardiothoracic Vascular Surgery)

## 2021-11-12 VITALS — BP 150/88 | HR 77 | Resp 20 | Ht 71.0 in | Wt 263.0 lb

## 2021-11-12 DIAGNOSIS — Z09 Encounter for follow-up examination after completed treatment for conditions other than malignant neoplasm: Secondary | ICD-10-CM

## 2021-11-12 DIAGNOSIS — J869 Pyothorax without fistula: Secondary | ICD-10-CM

## 2021-11-29 LAB — FUNGUS CULTURE WITH STAIN

## 2021-11-29 LAB — FUNGAL ORGANISM REFLEX

## 2021-11-29 LAB — FUNGUS CULTURE RESULT

## 2021-12-07 LAB — ACID FAST CULTURE WITH REFLEXED SENSITIVITIES (MYCOBACTERIA): Acid Fast Culture: NEGATIVE

## 2021-12-11 LAB — ACID FAST CULTURE WITH REFLEXED SENSITIVITIES (MYCOBACTERIA): Acid Fast Culture: NEGATIVE

## 2021-12-15 ENCOUNTER — Other Ambulatory Visit: Payer: Self-pay | Admitting: Thoracic Surgery (Cardiothoracic Vascular Surgery)

## 2021-12-15 DIAGNOSIS — J90 Pleural effusion, not elsewhere classified: Secondary | ICD-10-CM

## 2021-12-17 ENCOUNTER — Ambulatory Visit: Payer: Self-pay | Admitting: Thoracic Surgery (Cardiothoracic Vascular Surgery)

## 2021-12-29 ENCOUNTER — Ambulatory Visit (INDEPENDENT_AMBULATORY_CARE_PROVIDER_SITE_OTHER): Payer: Self-pay | Admitting: Thoracic Surgery (Cardiothoracic Vascular Surgery)

## 2021-12-29 ENCOUNTER — Ambulatory Visit
Admission: RE | Admit: 2021-12-29 | Discharge: 2021-12-29 | Disposition: A | Discharge status: Home / Self Care | Payer: Self-pay | Source: Ambulatory Visit | Attending: Thoracic Surgery (Cardiothoracic Vascular Surgery) | Admitting: Thoracic Surgery (Cardiothoracic Vascular Surgery)

## 2021-12-29 VITALS — BP 159/117 | HR 79 | Resp 18 | Ht 71.0 in | Wt 271.0 lb

## 2021-12-29 DIAGNOSIS — J90 Pleural effusion, not elsewhere classified: Secondary | ICD-10-CM

## 2021-12-29 DIAGNOSIS — Z09 Encounter for follow-up examination after completed treatment for conditions other than malignant neoplasm: Secondary | ICD-10-CM

## 2021-12-29 MED ORDER — AMLODIPINE BESYLATE 10 MG PO TABS: 10.0000 mg | ORAL_TABLET | Freq: Every day | ORAL | 3 refills | Status: DC

## 2021-12-29 MED ORDER — CARVEDILOL 25 MG PO TABS
25.0000 mg | ORAL_TABLET | Freq: Two times a day (BID) | ORAL | 3 refills | Status: DC
Start: 2021-12-29 — End: 2022-10-03

## 2021-12-31 NOTE — Progress Notes (Signed)
      [address]       [place] [address]             [phone]        Sherill L Bonnell [place] Medical Record #[id number] Date of Birth: November 09, 1985  Referring: Juanito Doom, MD Primary Care: Patient, No Pcp Per Primary Cardiologist:None  Reason for visit:   follow-up  History of Present Illness:     36 year old male comes in for his 1 month follow-up appointment after undergoing a decortication.  Overall he is doing well.  His pain is well controlled, and he denies any shortness of breath.  Physical Exam: BP (!) 159/117 (BP Location: Right Arm, Patient Position: Sitting)   Pulse 79   Resp 18   Ht 5\' 11"  (1.803 m)   Wt 271 lb (122.9 kg)   SpO2 96% Comment: RA  BMI 37.80 kg/m   Alert NAD Incision clean.   Abdomen, ND No peripheral edema   Diagnostic Studies & Laboratory data: CXR: Clear     Assessment / Plan:   36 year old male status post decortication for loculated empyema.  He will follow-up as needed.   Lajuana Matte 12/31/2021 10:04 AM

## 2022-01-18 ENCOUNTER — Encounter: Payer: Self-pay | Admitting: Cardiovascular Disease

## 2022-10-03 ENCOUNTER — Encounter (HOSPITAL_BASED_OUTPATIENT_CLINIC_OR_DEPARTMENT_OTHER): Payer: Self-pay | Admitting: Emergency Medicine

## 2022-10-03 ENCOUNTER — Emergency Department (HOSPITAL_BASED_OUTPATIENT_CLINIC_OR_DEPARTMENT_OTHER): Payer: Self-pay

## 2022-10-03 ENCOUNTER — Emergency Department (HOSPITAL_BASED_OUTPATIENT_CLINIC_OR_DEPARTMENT_OTHER)
Admission: EM | Admit: 2022-10-03 | Discharge: 2022-10-03 | Disposition: A | Discharge status: Home / Self Care | Payer: Self-pay | Attending: Emergency Medicine | Admitting: Emergency Medicine

## 2022-10-03 ENCOUNTER — Other Ambulatory Visit: Payer: Self-pay

## 2022-10-03 DIAGNOSIS — I10 Essential (primary) hypertension: Secondary | ICD-10-CM | POA: Insufficient documentation

## 2022-10-03 DIAGNOSIS — R519 Headache, unspecified: Secondary | ICD-10-CM

## 2022-10-03 DIAGNOSIS — Z79899 Other long term (current) drug therapy: Secondary | ICD-10-CM | POA: Insufficient documentation

## 2022-10-03 HISTORY — DX: Pyothorax without fistula: J86.9

## 2022-10-03 MED ORDER — CARVEDILOL 25 MG PO TABS: 25.0000 mg | ORAL_TABLET | Freq: Two times a day (BID) | ORAL | 3 refills | Status: DC

## 2022-10-03 MED ORDER — AMOXICILLIN-POT CLAVULANATE 875-125 MG PO TABS: 1.0000 | ORAL_TABLET | Freq: Two times a day (BID) | ORAL | 0 refills | Status: AC

## 2022-10-03 MED ORDER — AMLODIPINE BESYLATE 10 MG PO TABS
10.0000 mg | ORAL_TABLET | Freq: Every day | ORAL | 3 refills | Status: DC
Start: 2022-10-03 — End: 2023-02-15

## 2022-10-03 MED ORDER — ACETAMINOPHEN 500 MG PO TABS
1000.0000 mg | ORAL_TABLET | Freq: Once | ORAL | Status: AC
  Administered 2022-10-03: 1000 mg via ORAL
  Filled 2022-10-03: qty 2

## 2022-10-03 MED ORDER — AMOXICILLIN-POT CLAVULANATE 875-125 MG PO TABS
1.0000 | ORAL_TABLET | Freq: Once | ORAL | Status: AC
  Administered 2022-10-03: 1 via ORAL
  Filled 2022-10-03: qty 1

## 2022-10-03 NOTE — ED Triage Notes (Signed)
Reports HA and intermittent lightheadedness since Friday. Denies other sx. States he is not lightheaded presently. Hx HTN, has been noncompliant with meds "for months".

## 2022-10-03 NOTE — Discharge Instructions (Signed)
Your CT scan did not show any obvious bleeding inside your skull.  There were some signs that you could have a sinus infection.  I am starting you on antibiotics for this.  I have given you information to follow-up with the neurologist in the office.  Please take your blood pressure medicines as prescribed.  Please follow-up with the family doctor in the office.

## 2022-10-03 NOTE — ED Provider Notes (Signed)
[facility] Provider Note   CSN: [id number] Arrival date & time: 10/03/22  0043     History  Chief Complaint  Patient presents with   Headache    Todd Pena is a 37 y.o. male.  [name] with a chief complaints of a frontal headache.  This been going on for about 3 to 4 days now.  Mliss Sax he feels a bit lightheaded as well.  He has not been sleeping the last 3 to 4 days because he has been "on the go "he denies any trauma to the head of the neck denies cough congestion or fever denies one-sided numbness or weakness denies difficulty speech or swallowing.  He was started on antihypertensives but has not been taking his medicine for some time.  Denies chest pain denies difficulty breathing.   Headache      Home Medications Prior to Admission medications   Medication Sig Start Date End Date Taking? Authorizing Provider  amoxicillin-clavulanate (AUGMENTIN) 875-125 MG tablet Take 1 tablet by mouth every 12 (twelve) hours. 10/03/22  Yes Melene Plan, DO  acetaminophen (TYLENOL) 325 MG tablet Take 2 tablets (650 mg total) by mouth every 6 (six) hours as needed for mild pain, fever or headache. 10/30/21   Leroy Sea, MD  amLODipine (NORVASC) 10 MG tablet Take 1 tablet (10 mg total) by mouth daily. 10/03/22 01/01/23  Melene Plan, DO  carvedilol (COREG) 25 MG tablet Take 1 tablet (25 mg total) by mouth 2 (two) times daily with a meal. 10/03/22   Melene Plan, DO      Allergies    Patient has no known allergies.    Review of Systems   Review of Systems  Neurological:  Positive for headaches.    Physical Exam Updated Vital Signs BP (!) 183/125 (BP Location: Right Arm)   Pulse 88   Temp 98.1 F (36.7 C)   Resp 18   Ht 5\' 11"  (1.803 m)   Wt 129.3 kg   SpO2 99%   BMI 39.75 kg/m  Physical Exam Vitals and nursing note reviewed.  Constitutional:      Appearance: He is well-developed.  HENT:     Head: Normocephalic and atraumatic.   Eyes:     Pupils: Pupils are equal, round, and reactive to light.  Neck:     Vascular: No JVD.  Cardiovascular:     Rate and Rhythm: Normal rate and regular rhythm.     Heart sounds: No murmur heard.    No friction rub. No gallop.  Pulmonary:     Effort: No respiratory distress.     Breath sounds: No wheezing.  Abdominal:     General: There is no distension.     Tenderness: There is no abdominal tenderness. There is no guarding or rebound.  Musculoskeletal:        General: Normal range of motion.     Cervical back: Normal range of motion and neck supple.  Skin:    Coloration: Skin is not pale.     Findings: No rash.  Neurological:     Mental Status: He is alert and oriented to person, place, and time.     GCS: GCS eye subscore is 4. GCS verbal subscore is 5. GCS motor subscore is 6.     Cranial Nerves: Cranial nerves 2-12 are intact.     Sensory: Sensation is intact.     Motor: Motor function is intact.  Coordination: Coordination is intact.     Comments: Ambulates without issue.  Benign neurologic exam.  Psychiatric:        Behavior: Behavior normal.     ED Results / Procedures / Treatments   Labs (all labs ordered are listed, but only abnormal results are displayed) Labs Reviewed - No data to display  EKG None  Radiology CT Head Wo Contrast  Result Date: 10/03/2022 CLINICAL DATA:  Headache, sudden, severe EXAM: CT HEAD WITHOUT CONTRAST TECHNIQUE: Contiguous axial images were obtained from the base of the skull through the vertex without intravenous contrast. RADIATION DOSE REDUCTION: This exam was performed according to the departmental dose-optimization program which includes automated exposure control, adjustment of the mA and/or kV according to patient size and/or use of iterative reconstruction technique. COMPARISON:  10/24/2013 FINDINGS: Brain: No evidence of acute infarction, hemorrhage, hydrocephalus, extra-axial collection or mass lesion/mass effect.  Vascular: No hyperdense vessel or unexpected calcification. Skull: Normal. Negative for fracture or focal lesion. Sinuses/Orbits: Mild mucosal thickening seen within the right frontal and maxillary sinuses and small mucous retention cysts are seen within the visualized maxillary sinuses. No air-fluid levels. Remaining paranasal sinuses are clear. Orbits are unremarkable Other: Mastoid air cells and middle ear cavities are clear. IMPRESSION: 1. No acute intracranial abnormality. 2. Mild right frontal and maxillary sinus disease. Electronically Signed   By: Helyn Numbers M.D.   On: 10/03/2022 01:25    Procedures Procedures    Medications Ordered in ED Medications  amoxicillin-clavulanate (AUGMENTIN) 875-125 MG per tablet 1 tablet (has no administration in time range)  acetaminophen (TYLENOL) tablet 1,000 mg (1,000 mg Oral Given 10/03/22 0118)    ED Course/ Medical Decision Making/ A&P                                 Medical Decision Making Amount and/or Complexity of Data Reviewed Radiology: ordered.  Risk OTC drugs. Prescription drug management.   [name] with a chief complaints of frontal headache.  Going on for about 4 days.  Been going on off and on.  He is significantly hypertensive otherwise has no obvious concerning finding on physical exam.  Will obtain a CT of the head.  He is driving home we will give a dose of Tylenol.   CT of the head independently interpreted by me without obvious intracranial hemorrhage.  Radiology read with some concern for possible sinus infection.  Will start on antibiotics.  1:32 AM:  I have discussed the diagnosis/risks/treatment options with the patient.  Evaluation and diagnostic testing in the emergency department does not suggest an emergent condition requiring admission or immediate intervention beyond what has been performed at this time.  They will follow up with PCP. We also discussed returning to the ED immediately if new or worsening sx  occur. We discussed the sx which are most concerning (e.g., sudden worsening pain, fever, inability to tolerate by mouth) that necessitate immediate return. Medications administered to the patient during their visit and any new prescriptions provided to the patient are listed below.  Medications given during this visit Medications  amoxicillin-clavulanate (AUGMENTIN) 875-125 MG per tablet 1 tablet (has no administration in time range)  acetaminophen (TYLENOL) tablet 1,000 mg (1,000 mg Oral Given 10/03/22 0118)     The patient appears reasonably screen and/or stabilized for discharge and I doubt any other medical condition or other [facility] requiring further screening, evaluation, or treatment in the ED  at this time prior to discharge.         Final Clinical Impression(s) / ED Diagnoses Final diagnoses:  Uncontrolled hypertension  Frontal headache    Rx / DC Orders ED Discharge Orders          Ordered    amLODipine (NORVASC) 10 MG tablet  Daily        10/03/22 0107    carvedilol (COREG) 25 MG tablet  2 times daily with meals        10/03/22 0107    amoxicillin-clavulanate (AUGMENTIN) 875-125 MG tablet  Every 12 hours        10/03/22 0130              Melene Plan, DO 10/03/22 0132

## 2023-02-15 ENCOUNTER — Emergency Department (HOSPITAL_BASED_OUTPATIENT_CLINIC_OR_DEPARTMENT_OTHER)
Admission: EM | Admit: 2023-02-15 | Discharge: 2023-02-15 | Disposition: A | Discharge status: Home / Self Care | Payer: Self-pay | Attending: Emergency Medicine | Admitting: Emergency Medicine

## 2023-02-15 ENCOUNTER — Encounter (HOSPITAL_BASED_OUTPATIENT_CLINIC_OR_DEPARTMENT_OTHER): Payer: Self-pay

## 2023-02-15 ENCOUNTER — Other Ambulatory Visit: Payer: Self-pay

## 2023-02-15 DIAGNOSIS — K602 Anal fissure, unspecified: Secondary | ICD-10-CM | POA: Insufficient documentation

## 2023-02-15 LAB — BASIC METABOLIC PANEL
Anion gap: 8 (ref 5–15)
BUN: 14 mg/dL (ref 6–20)
CO2: 25 mmol/L (ref 22–32)
Calcium: 8.8 mg/dL — ABNORMAL LOW (ref 8.9–10.3)
Chloride: 103 mmol/L (ref 98–111)
Creatinine, Ser: 1.37 mg/dL — ABNORMAL HIGH (ref 0.61–1.24)
GFR, Estimated: >60 mL/min (ref >60)
Glucose, Bld: 109 mg/dL — ABNORMAL HIGH (ref 70–99)
Potassium: 3.6 mmol/L (ref 3.5–5.1)
Sodium: 136 mmol/L (ref 135–145)

## 2023-02-15 LAB — CBC WITH DIFFERENTIAL/PLATELET
Abs Immature Granulocytes: 0.04 10*3/uL (ref 0.00–0.07)
Basophils Absolute: 0.1 10*3/uL (ref 0.0–0.1)
Basophils Relative: 1 %
Eosinophils Absolute: 0.2 10*3/uL (ref 0.0–0.5)
Eosinophils Relative: 2 %
HCT: 44.3 % (ref 39.0–52.0)
Hemoglobin: 14.9 g/dL (ref 13.0–17.0)
Immature Granulocytes: 0 %
Lymphocytes Relative: 20 %
Lymphs Abs: 2.3 10*3/uL (ref 0.7–4.0)
MCH: 29.7 pg (ref 26.0–34.0)
MCHC: 33.6 g/dL (ref 30.0–36.0)
MCV: 88.2 fL (ref 80.0–100.0)
Monocytes Absolute: 0.8 10*3/uL (ref 0.1–1.0)
Monocytes Relative: 8 %
Neutro Abs: 7.7 10*3/uL (ref 1.7–7.7)
Neutrophils Relative %: 69 %
Platelets: 688 10*3/uL — ABNORMAL HIGH (ref 150–400)
RBC: 5.02 MIL/uL (ref 4.22–5.81)
RDW: 12.4 % (ref 11.5–15.5)
WBC: 11.1 10*3/uL — ABNORMAL HIGH (ref 4.0–10.5)
nRBC: 0 % (ref 0.0–0.2)

## 2023-02-15 LAB — OCCULT BLOOD X 1 CARD TO LAB, STOOL: Fecal Occult Bld: POSITIVE — AB

## 2023-02-15 MED ORDER — HYDROCORTISONE ACETATE 25 MG RE SUPP: 25.0000 mg | Freq: Two times a day (BID) | RECTAL | 0 refills | Status: DC

## 2023-02-15 MED ORDER — AMLODIPINE BESYLATE 10 MG PO TABS: 10.0000 mg | ORAL_TABLET | Freq: Every day | ORAL | 0 refills | Status: AC

## 2023-02-15 MED ORDER — HYDROCORTISONE ACETATE 25 MG RE SUPP: 25.0000 mg | Freq: Two times a day (BID) | RECTAL | 0 refills | Status: AC

## 2023-02-15 NOTE — ED Triage Notes (Signed)
Pt states that since yesterday he has had rectal bleeding. Pt reports the blood is "nonstop". Was not straining and was in the shower when he noticed this x1 day. Pt endorses weakness. Seen 7-8 yrs ago and unable to figure out what was wrong with him.

## 2023-02-15 NOTE — ED Provider Notes (Signed)
[place] [facility] Provider Note   CSN: [id number] Arrival date & time: 02/15/23  0530     History  Chief Complaint  Patient presents with   Rectal Bleeding    Todd Pena is a 37 y.o. male.  The history is provided by the patient.  Rectal Bleeding Chronicity:  Recurrent Context: not rectal pain   Relieved by:  Nothing Worsened by:  Nothing Ineffective treatments:  None tried Associated symptoms: no abdominal pain, no fever, no hematemesis, no loss of consciousness and no vomiting   Risk factors: no anticoagulant use, no hx of colorectal cancer, no hx of IBD and no liver disease   Patient with a history of thrombocytosis and hemorrhoids who was seen once previously for this and received a diagnosis of hemorrhoids.  Reports he may have had a large BM and then started having rectal bleeding for several hours.  Patient has not seen a doctor or taken medication in some time.       Home Medications Prior to Admission medications   Medication Sig Start Date End Date Taking? Authorizing Provider  acetaminophen (TYLENOL) 325 MG tablet Take 2 tablets (650 mg total) by mouth every 6 (six) hours as needed for mild pain, fever or headache. 10/30/21   Leroy Sea, MD  amLODipine (NORVASC) 10 MG tablet Take 1 tablet (10 mg total) by mouth daily. 10/03/22 01/01/23  Melene Plan, DO  amoxicillin-clavulanate (AUGMENTIN) 875-125 MG tablet Take 1 tablet by mouth every 12 (twelve) hours. 10/03/22   Melene Plan, DO  carvedilol (COREG) 25 MG tablet Take 1 tablet (25 mg total) by mouth 2 (two) times daily with a meal. 10/03/22   Melene Plan, DO      Allergies    Patient has no known allergies.    Review of Systems   Review of Systems  Constitutional:  Negative for fever.  Gastrointestinal:  Positive for anal bleeding and hematochezia. Negative for abdominal pain, hematemesis and vomiting.  Neurological:  Negative for loss of consciousness.  All other  systems reviewed and are negative.   Physical Exam Updated Vital Signs BP (!) 156/117 (BP Location: Right Arm)   Pulse 81   Temp 97.9 F (36.6 C) (Oral)   Resp 18   Ht 5\' 11"  (1.803 m)   Wt 127 kg   SpO2 96%   BMI 39.05 kg/m  Physical Exam Vitals and nursing note reviewed. Exam conducted with a chaperone present.  Constitutional:      General: He is not in acute distress.    Appearance: He is well-developed. He is not diaphoretic.  HENT:     Head: Normocephalic and atraumatic.     Nose: Nose normal.  Eyes:     Conjunctiva/sclera: Conjunctivae normal.     Pupils: Pupils are equal, round, and reactive to light.  Cardiovascular:     Rate and Rhythm: Normal rate and regular rhythm.     Pulses: Normal pulses.     Heart sounds: Normal heart sounds.  Pulmonary:     Effort: Pulmonary effort is normal.     Breath sounds: Normal breath sounds. No wheezing or rales.  Abdominal:     General: Bowel sounds are normal.     Palpations: Abdomen is soft.     Tenderness: There is no abdominal tenderness. There is no guarding or rebound.  Genitourinary:    Comments: Anal fissure 6 o' clock position Musculoskeletal:        General: Normal  range of motion.     Cervical back: Normal range of motion and neck supple.  Skin:    General: Skin is warm and dry.     Capillary Refill: Capillary refill takes less than 2 seconds.  Neurological:     General: No focal deficit present.     Mental Status: He is alert and oriented to person, place, and time.     Deep Tendon Reflexes: Reflexes normal.  Psychiatric:        Mood and Affect: Mood normal.     ED Results / Procedures / Treatments   Labs (all labs ordered are listed, but only abnormal results are displayed) Results for orders placed or performed during the hospital encounter of 02/15/23  Occult blood card to lab, stool Provider will collect   Collection Time: 02/15/23  6:06 AM  Result Value Ref Range   Fecal Occult Bld POSITIVE (A)  NEGATIVE  CBC with Differential   Collection Time: 02/15/23  6:11 AM  Result Value Ref Range   WBC 11.1 (H) 4.0 - 10.5 K/uL   RBC 5.02 4.22 - 5.81 MIL/uL   Hemoglobin 14.9 13.0 - 17.0 g/dL   HCT 60.4 54.0 - 98.1 %   MCV 88.2 80.0 - 100.0 fL   MCH 29.7 26.0 - 34.0 pg   MCHC 33.6 30.0 - 36.0 g/dL   RDW 19.1 47.8 - 29.5 %   Platelets 688 (H) 150 - 400 K/uL   nRBC 0.0 0.0 - 0.2 %   Neutrophils Relative % 69 %   Neutro Abs 7.7 1.7 - 7.7 K/uL   Lymphocytes Relative 20 %   Lymphs Abs 2.3 0.7 - 4.0 K/uL   Monocytes Relative 8 %   Monocytes Absolute 0.8 0.1 - 1.0 K/uL   Eosinophils Relative 2 %   Eosinophils Absolute 0.2 0.0 - 0.5 K/uL   Basophils Relative 1 %   Basophils Absolute 0.1 0.0 - 0.1 K/uL   Immature Granulocytes 0 %   Abs Immature Granulocytes 0.04 0.00 - 0.07 K/uL  Basic metabolic panel   Collection Time: 02/15/23  6:11 AM  Result Value Ref Range   Sodium 136 135 - 145 mmol/L   Potassium 3.6 3.5 - 5.1 mmol/L   Chloride 103 98 - 111 mmol/L   CO2 25 22 - 32 mmol/L   Glucose, Bld 109 (H) 70 - 99 mg/dL   BUN 14 6 - 20 mg/dL   Creatinine, Ser 6.21 (H) 0.61 - 1.24 mg/dL   Calcium 8.8 (L) 8.9 - 10.3 mg/dL   GFR, Estimated >30 >86 mL/min   Anion gap 8 5 - 15   No results found.  Radiology No results found.  Procedures Procedures    Medications Ordered in ED Medications - No data to display  ED Course/ Medical Decision Making/ A&P                                 Medical Decision Making Patient with anal bleeding   Amount and/or Complexity of Data Reviewed External Data Reviewed: labs and notes.    Details: Previous notes and labs reviewed.  Hemoglobin is better than baseline, platelets are lower than baseline  Labs: ordered.    Details: Normal sodium 136, normal potassium 3.6, creatinine slight elevation 1.37.  White count 11.1 slight elevation but below previous visits.  Hemoglobin 14.9 normal.  Platelets elevated 688   Risk Prescription drug  management. Risk Details:  There was blood at fissure when I did the rectal.  The blood was crusted posteriorly.  I believe this is the source.  Hemoglobin is better than baseline.  Platelets are elevated but lower then baseline patient needs to follow up with a PMD and GI for ongoing care, I have referred to a new PMD and GI for follow up, patient verbalizes understanding and agrees to follow up.  I have refilled his BP medication.  Stable for discharge.  Strict return     Final Clinical Impression(s) / ED Diagnoses Final diagnoses:  Anal fissure   Return for intractable cough, coughing up blood, fevers > 100.4 unrelieved by medication, shortness of breath, intractable vomiting, chest pain, shortness of breath, weakness, numbness, changes in speech, facial asymmetry, abdominal pain, passing out, Inability to tolerate liquids or food, cough, altered mental status or any concerns. No signs of systemic illness or infection. The patient is nontoxic-appearing on exam and vital signs are within normal limits.  I have reviewed the triage vital signs and the nursing notes. Pertinent labs & imaging results that were available during my care of the patient were reviewed by me and considered in my medical decision making (see chart for details). After history, exam, and medical workup I feel the patient has been appropriately medically screened and is safe for discharge home. Pertinent diagnoses were discussed with the patient. Patient was given return precautions.  Rx / DC Orders ED Discharge Orders     None         Jazmen Lindenbaum, MD 02/15/23 [id number]

## 2023-11-29 ENCOUNTER — Other Ambulatory Visit: Payer: Self-pay

## 2023-11-29 ENCOUNTER — Emergency Department (HOSPITAL_BASED_OUTPATIENT_CLINIC_OR_DEPARTMENT_OTHER)
Admission: EM | Admit: 2023-11-29 | Discharge: 2023-11-29 | Disposition: A | Discharge status: Home / Self Care | Payer: Self-pay | Attending: Emergency Medicine | Admitting: Emergency Medicine

## 2023-11-29 ENCOUNTER — Encounter (HOSPITAL_BASED_OUTPATIENT_CLINIC_OR_DEPARTMENT_OTHER): Payer: Self-pay

## 2023-11-29 DIAGNOSIS — K029 Dental caries, unspecified: Secondary | ICD-10-CM | POA: Insufficient documentation

## 2023-11-29 DIAGNOSIS — I10 Essential (primary) hypertension: Secondary | ICD-10-CM

## 2023-11-29 MED ORDER — PENICILLIN V POTASSIUM 500 MG PO TABS: 500.0000 mg | ORAL_TABLET | Freq: Four times a day (QID) | ORAL | 0 refills | Status: AC

## 2023-11-29 MED ORDER — PENICILLIN V POTASSIUM 250 MG PO TABS
500.0000 mg | ORAL_TABLET | Freq: Once | ORAL | Status: AC
  Administered 2023-11-29: 500 mg via ORAL
  Filled 2023-11-29: qty 2

## 2023-11-29 MED ORDER — AMLODIPINE BESYLATE 10 MG PO TABS: 10.0000 mg | ORAL_TABLET | Freq: Every day | ORAL | 0 refills | Status: AC

## 2023-11-29 MED ORDER — AMLODIPINE BESYLATE 5 MG PO TABS
10.0000 mg | ORAL_TABLET | Freq: Once | ORAL | Status: AC
  Administered 2023-11-29: 10 mg via ORAL
  Filled 2023-11-29: qty 2

## 2023-11-29 MED ORDER — LIDOCAINE VISCOUS HCL 2 % MT SOLN
15.0000 mL | Freq: Once | OROMUCOSAL | Status: AC
  Administered 2023-11-29: 15 mL via OROMUCOSAL
  Filled 2023-11-29: qty 15

## 2023-11-29 MED ORDER — NAPROXEN 500 MG PO TABS: 500.0000 mg | ORAL_TABLET | Freq: Two times a day (BID) | ORAL | 0 refills | Status: AC

## 2023-11-29 NOTE — ED Provider Notes (Signed)
 [place] [facility] Provider Note   CSN: [id number] Arrival date & time: 11/29/23  9750     Patient presents with: Dental Pain   Todd Pena is a 38 y.o. male.   The history is provided by the patient.  Dental Pain Location:  Lower Lower teeth location:  30/RL 1st molar, 29/RL 2nd bicuspid and 28/RL 1st bicuspid Quality:  Aching Severity:  Severe Onset quality:  Sudden Duration:  1 day Timing:  Constant Chronicity:  Recurrent Context: dental caries, dental fracture, enamel fracture and poor dentition   Previous work-up:  Dental exam Relieved by:  Nothing Worsened by:  Nothing Associated symptoms: no congestion, no facial swelling, no fever and no trismus   Risk factors: no diabetes        Prior to Admission medications   Medication Sig Start Date End Date Taking? Authorizing Provider  naproxen  (NAPROSYN ) 500 MG tablet Take 1 tablet (500 mg total) by mouth 2 (two) times daily with a meal. 11/29/23  Yes Aniela Caniglia, MD  penicillin  v potassium (VEETID) 500 MG tablet Take 1 tablet (500 mg total) by mouth 4 (four) times daily for 7 days. 11/29/23 12/06/23 Yes Jantzen Pilger, MD  acetaminophen  (TYLENOL ) 325 MG tablet Take 2 tablets (650 mg total) by mouth every 6 (six) hours as needed for mild pain, fever or headache. 10/30/21   Singh, Prashant K, MD  amLODipine  (NORVASC ) 10 MG tablet Take 1 tablet (10 mg total) by mouth daily. 02/15/23 05/16/23  Dajuan Turnley, MD  amoxicillin -clavulanate (AUGMENTIN ) 875-125 MG tablet Take 1 tablet by mouth every 12 (twelve) hours. 10/03/22   Emil Share, DO  carvedilol  (COREG ) 25 MG tablet Take 1 tablet (25 mg total) by mouth 2 (two) times daily with a meal. 10/03/22   Emil Share, DO  hydrocortisone  (ANUSOL -HC) 25 MG suppository Place 1 suppository (25 mg total) rectally 2 (two) times daily. For 7 days 02/15/23   Jaquasia Doscher, MD    Allergies: Patient has no known allergies.    Review of Systems   Constitutional:  Negative for fever.  HENT:  Positive for dental problem. Negative for congestion and facial swelling.   All other systems reviewed and are negative.   Updated Vital Signs There were no vitals taken for this visit.  Physical Exam Vitals and nursing note reviewed. Exam conducted with a chaperone present.  Constitutional:      General: He is not in acute distress.    Appearance: He is well-developed. He is not diaphoretic.  HENT:     Head: Normocephalic and atraumatic.     Nose: Nose normal.     Mouth/Throat:   Eyes:     Conjunctiva/sclera: Conjunctivae normal.     Pupils: Pupils are equal, round, and reactive to light.  Cardiovascular:     Rate and Rhythm: Normal rate and regular rhythm.  Pulmonary:     Effort: Pulmonary effort is normal.     Breath sounds: Normal breath sounds. No wheezing or rales.  Abdominal:     General: Bowel sounds are normal.     Palpations: Abdomen is soft.     Tenderness: There is no abdominal tenderness. There is no guarding or rebound.  Musculoskeletal:        General: Normal range of motion.     Cervical back: Normal range of motion and neck supple.  Skin:    General: Skin is warm and dry.  Neurological:     Mental Status: He is  alert and oriented to person, place, and time.     (all labs ordered are listed, but only abnormal results are displayed) Labs Reviewed - No data to display  EKG: None  Radiology: No results found.   Procedures   Medications Ordered in the ED  penicillin  v potassium (VEETID) tablet 500 mg (has no administration in time range)  lidocaine  (XYLOCAINE ) 2 % viscous mouth solution 15 mL (has no administration in time range)                                    Medical Decision Making Patient with dental pain from dental caries   Amount and/or Complexity of Data Reviewed External Data Reviewed: notes.    Details: Previous notes reviewed   Risk Prescription drug management. Risk Details:  Well appearing no fevers, no facial swelling no trismus.  Take all antibiotics as prescribed and follow up with your dentist as directed      Final diagnoses:  Pain due to dental caries   No signs of systemic illness or infection. The patient is nontoxic-appearing on exam and vital signs are within normal limits.  I have reviewed the triage vital signs and the nursing notes. Pertinent labs & imaging results that were available during my care of the patient were reviewed by me and considered in my medical decision making (see chart for details). After history, exam, and medical workup I feel the patient has been appropriately medically screened and is safe for discharge home. Pertinent diagnoses were discussed with the patient. Patient was given return precautions.    ED Discharge Orders          Ordered    penicillin  v potassium (VEETID) 500 MG tablet  4 times daily        11/29/23 0256    naproxen  (NAPROSYN ) 500 MG tablet  2 times daily with meals        11/29/23 0256               Imani Sherrin, MD 11/29/23 9740

## 2023-11-29 NOTE — ED Triage Notes (Signed)
 Dental pain started yesterday Rt. Lower

## 2023-11-29 NOTE — ED Provider Notes (Signed)
 Also not taking his blood pressure medication.  Given in the ED refill sent.  Will need to follow up with PMD for ongoing care.     Joram Venson, MD 11/29/23 [phone]

## 2023-12-03 ENCOUNTER — Encounter (HOSPITAL_BASED_OUTPATIENT_CLINIC_OR_DEPARTMENT_OTHER): Payer: Self-pay | Admitting: Emergency Medicine

## 2023-12-03 ENCOUNTER — Other Ambulatory Visit: Payer: Self-pay

## 2023-12-03 ENCOUNTER — Emergency Department (HOSPITAL_BASED_OUTPATIENT_CLINIC_OR_DEPARTMENT_OTHER): Payer: Self-pay

## 2023-12-03 ENCOUNTER — Emergency Department (HOSPITAL_BASED_OUTPATIENT_CLINIC_OR_DEPARTMENT_OTHER)
Admission: EM | Admit: 2023-12-03 | Discharge: 2023-12-03 | Disposition: A | Discharge status: Home / Self Care | Payer: Self-pay | Attending: Emergency Medicine | Admitting: Emergency Medicine

## 2023-12-03 DIAGNOSIS — R1012 Left upper quadrant pain: Secondary | ICD-10-CM | POA: Insufficient documentation

## 2023-12-03 DIAGNOSIS — I10 Essential (primary) hypertension: Secondary | ICD-10-CM | POA: Insufficient documentation

## 2023-12-03 DIAGNOSIS — Z79899 Other long term (current) drug therapy: Secondary | ICD-10-CM | POA: Insufficient documentation

## 2023-12-03 LAB — CBC WITH DIFFERENTIAL/PLATELET
Abs Immature Granulocytes: 0.06 K/uL (ref 0.00–0.07)
Basophils Absolute: 0.1 K/uL (ref 0.0–0.1)
Basophils Relative: 1 %
Eosinophils Absolute: 0.3 K/uL (ref 0.0–0.5)
Eosinophils Relative: 2 %
HCT: 48.5 % (ref 39.0–52.0)
Hemoglobin: 16.6 g/dL (ref 13.0–17.0)
Immature Granulocytes: 1 %
Lymphocytes Relative: 18 %
Lymphs Abs: 2.1 K/uL (ref 0.7–4.0)
MCH: 29.6 pg (ref 26.0–34.0)
MCHC: 34.2 g/dL (ref 30.0–36.0)
MCV: 86.6 fL (ref 80.0–100.0)
Monocytes Absolute: 0.7 K/uL (ref 0.1–1.0)
Monocytes Relative: 6 %
Neutro Abs: 8.4 K/uL — ABNORMAL HIGH (ref 1.7–7.7)
Neutrophils Relative %: 72 %
Platelets: 853 K/uL — ABNORMAL HIGH (ref 150–400)
RBC: 5.6 MIL/uL (ref 4.22–5.81)
RDW: 12.6 % (ref 11.5–15.5)
WBC: 11.6 K/uL — ABNORMAL HIGH (ref 4.0–10.5)
nRBC: 0 % (ref 0.0–0.2)

## 2023-12-03 LAB — URINALYSIS, ROUTINE W REFLEX MICROSCOPIC
Bilirubin Urine: NEGATIVE
Glucose, UA: NEGATIVE mg/dL
Hgb urine dipstick: NEGATIVE
Ketones, ur: NEGATIVE mg/dL
Leukocytes,Ua: NEGATIVE
Nitrite: NEGATIVE
Protein, ur: 100 mg/dL — AB
Specific Gravity, Urine: 1.025 (ref 1.005–1.030)
pH: 6 (ref 5.0–8.0)

## 2023-12-03 LAB — RESP PANEL BY RT-PCR (RSV, FLU A&B, COVID)  RVPGX2
Influenza A by PCR: NEGATIVE
Influenza B by PCR: NEGATIVE
Resp Syncytial Virus by PCR: NEGATIVE
SARS Coronavirus 2 by RT PCR: NEGATIVE

## 2023-12-03 LAB — COMPREHENSIVE METABOLIC PANEL WITH GFR
ALT: 22 U/L (ref 0–44)
AST: 23 U/L (ref 15–41)
Albumin: 4.7 g/dL (ref 3.5–5.0)
Alkaline Phosphatase: 95 U/L (ref 38–126)
Anion gap: 12 (ref 5–15)
BUN: 13 mg/dL (ref 6–20)
CO2: 23 mmol/L (ref 22–32)
Calcium: 9.5 mg/dL (ref 8.9–10.3)
Chloride: 102 mmol/L (ref 98–111)
Creatinine, Ser: 1.03 mg/dL (ref 0.61–1.24)
GFR, Estimated: >60 mL/min (ref >60)
Glucose, Bld: 94 mg/dL (ref 70–99)
Potassium: 4.1 mmol/L (ref 3.5–5.1)
Sodium: 137 mmol/L (ref 135–145)
Total Bilirubin: 0.7 mg/dL (ref 0.0–1.2)
Total Protein: 7.9 g/dL (ref 6.5–8.1)

## 2023-12-03 LAB — URINALYSIS, MICROSCOPIC (REFLEX): Squamous Epithelial / HPF: NONE SEEN /HPF (ref 0–5)

## 2023-12-03 LAB — LIPASE, BLOOD: Lipase: 36 U/L (ref 11–51)

## 2023-12-03 MED ORDER — CARVEDILOL 25 MG PO TABS: 25.0000 mg | ORAL_TABLET | Freq: Two times a day (BID) | ORAL | 3 refills | Status: AC

## 2023-12-03 MED ORDER — IOHEXOL 300 MG/ML  SOLN
100.0000 mL | Freq: Once | INTRAMUSCULAR | Status: AC | PRN
  Administered 2023-12-03: 100 mL via INTRAVENOUS

## 2023-12-03 MED ORDER — FAMOTIDINE 20 MG PO TABS: 20.0000 mg | ORAL_TABLET | Freq: Two times a day (BID) | ORAL | 0 refills | Status: AC

## 2023-12-03 NOTE — ED Triage Notes (Signed)
 Pt c/o LUQ pain x 4-5 days; +nausea; BP elevated, pt sts he is taking HTN meds that were prescribed here

## 2023-12-03 NOTE — ED Provider Notes (Signed)
 [facility] Provider Note   CSN: [id number] Arrival date & time: 12/03/23  1353     Patient presents with: Abdominal Pain   Todd Pena is a 38 y.o. male.   Patient is a 38 year old male who presents with abdominal pain.  He states he has had some sinus congestion and coughing for about 3 weeks.  The sinus congestion is slowly improving but he has ongoing cough.  Cough is mostly nonproductive.  No associated shortness of breath.  No fevers.  He has also been having some abdominal pain in his left side for about the last 3 days.  Has been intermittent but today has been more persistent.  Says sharp pain in his left mid abdomen.  No nausea or vomiting.  No urinary symptoms.  No change in his stools.  He was seen here on October 1 for dental pain and was treated with penicillin  and Naprosyn .  He says that his symptoms have resolved.  He also has noted to have high blood pressure for a while.  He was given a prescription for amlodipine  on this last visit on October 1 which he says he has been taking.  He does not currently have a primary care provider.       Prior to Admission medications   Medication Sig Start Date End Date Taking? Authorizing Provider  famotidine (PEPCID) 20 MG tablet Take 1 tablet (20 mg total) by mouth 2 (two) times daily. 12/03/23  Yes Lenor Hollering, MD  acetaminophen  (TYLENOL ) 325 MG tablet Take 2 tablets (650 mg total) by mouth every 6 (six) hours as needed for mild pain, fever or headache. 10/30/21   Singh, Prashant K, MD  amLODipine  (NORVASC ) 10 MG tablet Take 1 tablet (10 mg total) by mouth daily. 02/15/23 05/16/23  Palumbo, April, MD  amLODipine  (NORVASC ) 10 MG tablet Take 1 tablet (10 mg total) by mouth daily. 11/29/23   Palumbo, April, MD  amoxicillin -clavulanate (AUGMENTIN ) 875-125 MG tablet Take 1 tablet by mouth every 12 (twelve) hours. 10/03/22   Emil Share, DO  carvedilol  (COREG ) 25 MG tablet Take 1 tablet (25 mg  total) by mouth 2 (two) times daily with a meal. 12/03/23   Lenor Hollering, MD  hydrocortisone  (ANUSOL -HC) 25 MG suppository Place 1 suppository (25 mg total) rectally 2 (two) times daily. For 7 days 02/15/23   Palumbo, April, MD  naproxen  (NAPROSYN ) 500 MG tablet Take 1 tablet (500 mg total) by mouth 2 (two) times daily with a meal. 11/29/23   Palumbo, April, MD  penicillin  v potassium (VEETID) 500 MG tablet Take 1 tablet (500 mg total) by mouth 4 (four) times daily for 7 days. 11/29/23 12/06/23  Palumbo, April, MD    Allergies: Patient has no known allergies.    Review of Systems  Constitutional:  Negative for chills, diaphoresis, fatigue and fever.  HENT:  Positive for congestion. Negative for rhinorrhea and sneezing.   Eyes: Negative.   Respiratory:  Positive for cough. Negative for chest tightness and shortness of breath.   Cardiovascular:  Negative for chest pain and leg swelling.  Gastrointestinal:  Positive for abdominal pain. Negative for blood in stool, diarrhea, nausea and vomiting.  Genitourinary:  Negative for difficulty urinating, flank pain, frequency and hematuria.  Musculoskeletal:  Negative for arthralgias and back pain.  Skin:  Negative for rash.  Neurological:  Negative for dizziness, speech difficulty, weakness, numbness and headaches.    Updated Vital Signs BP (!) 167/109 (BP Location:  Left Arm) Comment: Provider aware of BP at time of discharge  Pulse 87   Temp 98.6 F (37 C) (Oral)   Resp 18   Ht 5' 11 (1.803 m)   Wt 127 kg   SpO2 97%   BMI 39.05 kg/m   Physical Exam Constitutional:      Appearance: He is well-developed.  HENT:     Head: Normocephalic and atraumatic.  Eyes:     Pupils: Pupils are equal, round, and reactive to light.  Cardiovascular:     Rate and Rhythm: Normal rate and regular rhythm.     Heart sounds: Normal heart sounds.  Pulmonary:     Effort: Pulmonary effort is normal. No respiratory distress.     Breath sounds: Normal breath  sounds. No wheezing or rales.  Chest:     Chest wall: No tenderness.  Abdominal:     General: Bowel sounds are normal.     Palpations: Abdomen is soft.     Tenderness: There is abdominal tenderness in the left upper quadrant. There is no guarding or rebound.  Musculoskeletal:        General: Normal range of motion.     Cervical back: Normal range of motion and neck supple.     Comments: No edema or calf tenderness  Lymphadenopathy:     Cervical: No cervical adenopathy.  Skin:    General: Skin is warm and dry.     Findings: No rash.  Neurological:     Mental Status: He is alert and oriented to person, place, and time.     (all labs ordered are listed, but only abnormal results are displayed) Labs Reviewed  URINALYSIS, ROUTINE W REFLEX MICROSCOPIC - Abnormal; Notable for the following components:      Result Value   Protein, ur 100 (*)    All other components within normal limits  CBC WITH DIFFERENTIAL/PLATELET - Abnormal; Notable for the following components:   WBC 11.6 (*)    Platelets 853 (*)    Neutro Abs 8.4 (*)    All other components within normal limits  URINALYSIS, MICROSCOPIC (REFLEX) - Abnormal; Notable for the following components:   Bacteria, UA RARE (*)    All other components within normal limits  RESP PANEL BY RT-PCR (RSV, FLU A&B, COVID)  RVPGX2  LIPASE, BLOOD  COMPREHENSIVE METABOLIC PANEL WITH GFR    EKG: None  Radiology: DG Chest 2 View Result Date: 12/03/2023 CLINICAL DATA:  Cough.  Left upper quadrant pain. EXAM: CHEST - 2 VIEW COMPARISON:  Radiograph 12/29/2021 FINDINGS: The cardiomediastinal contours are normal. Mild subsegmental atelectasis or scarring at the right lung base, with some improvement from prior exam. Pulmonary vasculature is normal. No consolidation, pleural effusion, or pneumothorax. No acute osseous abnormalities are seen. IMPRESSION: No acute findings. Mild subsegmental atelectasis or scarring at the right lung base, with some  improvement from prior exam. Electronically Signed   By: Andrea Gasman M.D.   On: 12/03/2023 15:17   CT ABDOMEN PELVIS W CONTRAST Result Date: 12/03/2023 CLINICAL DATA:  Left-sided abdominal pain for 4-5 days. EXAM: CT ABDOMEN AND PELVIS WITH CONTRAST TECHNIQUE: Multidetector CT imaging of the abdomen and pelvis was performed using the standard protocol following bolus administration of intravenous contrast. RADIATION DOSE REDUCTION: This exam was performed according to the departmental dose-optimization program which includes automated exposure control, adjustment of the mA and/or kV according to patient size and/or use of iterative reconstruction technique. CONTRAST:  OMNIPAQUE  IOHEXOL  300 MG/ML  SOLN  COMPARISON:  None Available. FINDINGS: Lower chest: Minor subsegmental atelectasis or scarring in the right greater than left lower lobe. No basilar airspace disease or pleural effusion. Hepatobiliary: No focal liver abnormality is seen. No gallstones, gallbladder wall thickening, or biliary dilatation. Pancreas: No ductal dilatation or inflammation. Spleen: Spleen is borderline enlarged, 13.3 cm AP. No focal splenic abnormality. Adrenals/Urinary Tract: Normal adrenal glands. No hydronephrosis, renal calculi, or focal renal abnormality. Decompressed ureters. Nondistended urinary bladder. Stomach/Bowel: Tiny hiatal hernia. Multifocal colonic diverticulosis, but no diverticulitis. No small or large bowel inflammation. No small bowel distension or obstruction. Normal appendix. Vascular/Lymphatic: Normal caliber abdominal aorta. Mild iliac atherosclerosis. Patent portal vein. No enlarged lymph nodes in the abdomen or pelvis. Reproductive: Prostate is unremarkable. Other: Small fat containing left inguinal hernia. No ascites, free air or focal fluid collection. Musculoskeletal: There are no acute or suspicious osseous abnormalities. IMPRESSION: 1. No acute abnormality in the abdomen/pelvis. 2. Colonic  diverticulosis without diverticulitis. 3. Borderline splenomegaly. 4. Small fat containing left inguinal hernia. Electronically Signed   By: Andrea Gasman M.D.   On: 12/03/2023 15:16     Procedures   Medications Ordered in the ED  iohexol  (OMNIPAQUE ) 300 MG/ML solution 100 mL (100 mLs Intravenous Contrast Given 12/03/23 1450)                                    Medical Decision Making Amount and/or Complexity of Data Reviewed Labs: ordered. Radiology: ordered.  Risk Prescription drug management.   This patient presents to the ED for concern of abdominal pain, this involves an extensive number of treatment options, and is a complaint that carries with it a high risk of complications and morbidity.  I considered the following differential and admission for this acute, potentially life threatening condition.  The differential diagnosis includes gastritis, diverticulitis, kidney stone, colitis, constipation, ACS, pancreatitis  MDM:    Patient is a 38 year old male who presents with some discomfort in his left upper quadrant.  No vomiting.  No change in stools.  No urinary symptoms.  Labs are nonconcerning.  His WBC count is slightly elevated.  He does have a cough.  Chest x-ray was performed which does not show any signs of pneumonia.  This is likely residual cough from his recent URI.  No fever or other concerns for bacterial sinusitis.  He had a CT scan of his abdomen pelvis which did not show any acute abnormality.  No diverticulitis or colitis.  Suspect his pain may be related to some gastritis from his recent antibiotics and Naprosyn  use.  His abdominal exam is benign.  He did have noted inguinal hernia on CT.  He does not have any bowel in the hernia.  No pain in this area.  Advised him and his wife of this.  Advised that he will need to keep an eye on it and can discuss this with his primary care doctor.  If he has any ongoing symptoms, may need referral to surgery.  As far as his  abdominal pain, we will start him on Pepcid.  His blood pressure was noted to be markedly elevated.  He recently was started back on amlodipine .  On chart review, it looks like last year he was on both amlodipine  and Coreg .  Will add Coreg  back to his regimen.  Put in a TOC consult for PCP establishment.  He was discharged home in good condition.  Return precautions  were given.  He does have a blood pressure machine that he recently got at home and advised him to keep a close check on his blood pressures.  (Labs, imaging, consults)  Labs: I Ordered, and personally interpreted labs.  The pertinent results include: Normal lipase, LFTs normal, white count slightly elevated, urine not consistent with infection  Imaging Studies ordered: I ordered imaging studies including chest x-ray, CT abdomen pelvis I independently visualized and interpreted imaging. I agree with the radiologist interpretation  Additional history obtained from wife.  External records from outside source obtained and reviewed including history  Cardiac Monitoring: The patient was maintained on a cardiac monitor.  If on the cardiac monitor, I personally viewed and interpreted the cardiac monitored which showed an underlying rhythm of: Sinus rhythm  Reevaluation: After the interventions noted above, I reevaluated the patient and found that they have :improved  Social Determinants of Health:  no PCP  Disposition: Discharged to home  Co morbidities that complicate the patient evaluation  Past Medical History:  Diagnosis Date   Empyema (HCC)    Hypertension      Medicines Meds ordered this encounter  Medications   iohexol  (OMNIPAQUE ) 300 MG/ML solution 100 mL   carvedilol  (COREG ) 25 MG tablet    Sig: Take 1 tablet (25 mg total) by mouth 2 (two) times daily with a meal.    Dispense:  60 tablet    Refill:  3   famotidine (PEPCID) 20 MG tablet    Sig: Take 1 tablet (20 mg total) by mouth 2 (two) times daily.     Dispense:  30 tablet    Refill:  0    I have reviewed the patients home medicines and have made adjustments as needed  Problem List / ED Course: Problem List Items Addressed This Visit   None Visit Diagnoses       Uncontrolled hypertension    -  Primary   Relevant Medications   carvedilol  (COREG ) 25 MG tablet     Left upper quadrant abdominal pain                    Final diagnoses:  Uncontrolled hypertension  Left upper quadrant abdominal pain    ED Discharge Orders          Ordered    carvedilol  (COREG ) 25 MG tablet  2 times daily with meals        12/03/23 1603    famotidine (PEPCID) 20 MG tablet  2 times daily        12/03/23 1603    Referral to [facility] Care Management       Comments: Pharmacy Medication Management for Uncontrolled hypertension in the ED   12/03/23 1604               Lenor Hollering, MD 12/03/23 1623

## 2023-12-03 NOTE — ED Notes (Signed)
 Patient transported to CT

## 2023-12-03 NOTE — Discharge Instructions (Signed)

## 2023-12-04 ENCOUNTER — Telehealth: Payer: Self-pay | Admitting: *Deleted

## 2023-12-04 NOTE — Progress Notes (Unsigned)
 Care Guide Pharmacy Note  12/04/2023 Name: Todd Pena MRN: [id number] DOB: Jul 03, 1985  Referred By: Pcp, No Reason for referral: Call Attempt #1 and Complex Care Management (Outreach to schedule referral with pharmacist )   [name] is a 38 y.o. year old male who is a primary care patient of Pcp, No.  Cari L Lindner was referred to the pharmacist for assistance related to: HTN  An unsuccessful telephone outreach was attempted today to contact the patient who was referred to the pharmacy team for assistance with medication management. Additional attempts will be made to contact the patient.  [name], CMA [place]  [facility], [facility] Guide Direct Dial: [phone]  Fax: [phone] Website: [email or web address]

## 2023-12-05 NOTE — Progress Notes (Unsigned)
 Care Guide Pharmacy Note  12/05/2023 Name: Todd Pena MRN: [id number] DOB: 10-Mar-1985  Referred By: Pcp, No Reason for referral: Call Attempt #1 and Complex Care Management (Outreach to schedule referral with pharmacist )   [name] is a 38 y.o. year old male who is a primary care patient of Pcp, No.  Kazim L Kinnard was referred to the pharmacist for assistance related to: HTN  A second unsuccessful telephone outreach was attempted today to contact the patient who was referred to the pharmacy team for assistance with medication management. Additional attempts will be made to contact the patient.  [name], CMA [place]  [facility], [facility] Guide Direct Dial: [phone]  Fax: [phone] Website: [email or web address]

## 2023-12-06 NOTE — Progress Notes (Signed)
 Care Guide Pharmacy Note  12/06/2023 Name: Todd Pena MRN: [id number] DOB: August 02, 1985  Referred By: Pcp, No Reason for referral: Call Attempt #1 and Complex Care Management (Outreach to schedule referral with pharmacist )   Todd Pena is a 38 y.o. year old male who is a primary care patient of Pcp, No.  Todd Pena was referred to the pharmacist for assistance related to: HTN  A third unsuccessful telephone outreach was attempted today to contact the patient who was referred to the pharmacy team for assistance with medication management. The Population Health team is pleased to engage with this patient at any time in the future upon receipt of referral and should he/she be interested in assistance from the Population Health team.  [name], CMA [facility]  [facility], [facility] Guide Direct Dial: [phone]  Fax: [phone] Website: [email or web address]
